# Patient Record
Sex: Female | Born: 1990
Health system: Southern US, Community
[De-identification: ages and names within clinical notes are randomized; demographics above are authoritative.]

## PROBLEM LIST (undated history)

## (undated) DIAGNOSIS — D649 Anemia, unspecified: Secondary | ICD-10-CM

## (undated) DIAGNOSIS — E559 Vitamin D deficiency, unspecified: Secondary | ICD-10-CM

## (undated) DIAGNOSIS — J302 Other seasonal allergic rhinitis: Secondary | ICD-10-CM

## (undated) DIAGNOSIS — R87629 Unspecified abnormal cytological findings in specimens from vagina: Secondary | ICD-10-CM

## (undated) DIAGNOSIS — M549 Dorsalgia, unspecified: Secondary | ICD-10-CM

## (undated) DIAGNOSIS — T7840XA Allergy, unspecified, initial encounter: Secondary | ICD-10-CM

## (undated) DIAGNOSIS — F419 Anxiety disorder, unspecified: Secondary | ICD-10-CM

## (undated) DIAGNOSIS — G43909 Migraine, unspecified, not intractable, without status migrainosus: Secondary | ICD-10-CM

## (undated) DIAGNOSIS — M2550 Pain in unspecified joint: Secondary | ICD-10-CM

## (undated) HISTORY — DX: Unspecified abnormal cytological findings in specimens from vagina: R87.629

## (undated) HISTORY — DX: Dorsalgia, unspecified: M54.9

## (undated) HISTORY — DX: Anxiety disorder, unspecified: F41.9

## (undated) HISTORY — DX: Anemia, unspecified: D64.9

## (undated) HISTORY — PX: APPENDECTOMY: SHX54

## (undated) HISTORY — PX: LEEP: SHX91

## (undated) HISTORY — DX: Pain in unspecified joint: M25.50

## (undated) HISTORY — DX: Allergy, unspecified, initial encounter: T78.40XA

## (undated) HISTORY — DX: Vitamin D deficiency, unspecified: E55.9

---
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
## 2009-03-10 ENCOUNTER — Emergency Department (HOSPITAL_COMMUNITY): Admission: EM | Admit: 2009-03-10 | Discharge: 2009-03-10 | Payer: Self-pay | Admitting: Family Medicine

## 2009-05-25 ENCOUNTER — Emergency Department (HOSPITAL_COMMUNITY): Admission: EM | Admit: 2009-05-25 | Discharge: 2009-05-25 | Payer: Self-pay | Admitting: Family Medicine

## 2009-05-26 ENCOUNTER — Observation Stay (HOSPITAL_COMMUNITY): Admission: EM | Admit: 2009-05-26 | Discharge: 2009-05-26 | Payer: Self-pay | Admitting: Emergency Medicine

## 2010-04-13 LAB — DIFFERENTIAL
Basophils Absolute: 0.1 10*3/uL (ref 0.0–0.1)
Basophils Relative: 0 % (ref 0–1)
Eosinophils Absolute: 0.1 10*3/uL (ref 0.0–0.7)
Lymphs Abs: 2.2 10*3/uL (ref 0.7–4.0)
Monocytes Absolute: 0.6 10*3/uL (ref 0.1–1.0)
Neutro Abs: 9.1 10*3/uL — ABNORMAL HIGH (ref 1.7–7.7)

## 2010-04-13 LAB — POCT URINALYSIS DIP (DEVICE)
Glucose, UA: NEGATIVE mg/dL
Hgb urine dipstick: NEGATIVE
Ketones, ur: 40 mg/dL — AB
Specific Gravity, Urine: 1.02 (ref 1.005–1.030)
Urobilinogen, UA: 1 mg/dL (ref 0.0–1.0)

## 2010-04-13 LAB — BASIC METABOLIC PANEL
CO2: 26 mEq/L (ref 19–32)
Creatinine, Ser: 0.68 mg/dL (ref 0.4–1.2)
GFR calc Af Amer: >60 mL/min (ref >60)
GFR calc non Af Amer: >60 mL/min (ref >60)
Potassium: 3.5 mEq/L (ref 3.5–5.1)

## 2010-04-13 LAB — CBC
HCT: 34.9 % — ABNORMAL LOW (ref 36.0–46.0)
Hemoglobin: 12 g/dL (ref 12.0–15.0)
MCHC: 34.3 g/dL (ref 30.0–36.0)
MCV: 84.4 fL (ref 78.0–100.0)
Platelets: 231 10*3/uL (ref 150–400)
RBC: 4.14 MIL/uL (ref 3.87–5.11)

## 2010-04-13 LAB — POCT PREGNANCY, URINE: Preg Test, Ur: NEGATIVE

## 2010-05-10 LAB — POCT URINALYSIS DIP (DEVICE)
Glucose, UA: NEGATIVE mg/dL
Hgb urine dipstick: NEGATIVE
Ketones, ur: NEGATIVE mg/dL
Specific Gravity, Urine: 1.025 (ref 1.005–1.030)
Urobilinogen, UA: 0.2 mg/dL (ref 0.0–1.0)
pH: 7 (ref 5.0–8.0)

## 2010-05-10 LAB — POCT PREGNANCY, URINE: Preg Test, Ur: NEGATIVE

## 2010-05-11 LAB — POCT RAPID STREP A (OFFICE): Streptococcus, Group A Screen (Direct): NEGATIVE

## 2010-09-28 ENCOUNTER — Inpatient Hospital Stay (INDEPENDENT_AMBULATORY_CARE_PROVIDER_SITE_OTHER)
Admission: RE | Admit: 2010-09-28 | Discharge: 2010-09-28 | Disposition: A | Discharge status: Home / Self Care | Source: Ambulatory Visit | Attending: Family Medicine | Admitting: Family Medicine

## 2010-09-28 DIAGNOSIS — J309 Allergic rhinitis, unspecified: Secondary | ICD-10-CM

## 2010-11-10 ENCOUNTER — Inpatient Hospital Stay (INDEPENDENT_AMBULATORY_CARE_PROVIDER_SITE_OTHER)
Admission: RE | Admit: 2010-11-10 | Discharge: 2010-11-10 | Disposition: A | Discharge status: Home / Self Care | Source: Ambulatory Visit | Attending: Family Medicine | Admitting: Family Medicine

## 2010-11-10 DIAGNOSIS — M722 Plantar fascial fibromatosis: Secondary | ICD-10-CM

## 2011-06-19 ENCOUNTER — Encounter (HOSPITAL_COMMUNITY): Payer: Self-pay

## 2011-06-19 ENCOUNTER — Emergency Department (HOSPITAL_COMMUNITY)
Admission: EM | Admit: 2011-06-19 | Discharge: 2011-06-19 | Disposition: A | Discharge status: Home / Self Care | Source: Home / Self Care | Attending: Emergency Medicine | Admitting: Emergency Medicine

## 2011-06-19 DIAGNOSIS — G44209 Tension-type headache, unspecified, not intractable: Secondary | ICD-10-CM

## 2011-06-19 DIAGNOSIS — F4541 Pain disorder exclusively related to psychological factors: Secondary | ICD-10-CM

## 2011-06-19 MED ORDER — ACETAMINOPHEN-CODEINE #3 300-30 MG PO TABS: 1.0000 | ORAL_TABLET | Freq: Four times a day (QID) | ORAL | Status: AC | PRN

## 2011-06-19 NOTE — ED Notes (Signed)
Pt has headache that started on Wednesday, denies cough, fever and congestion.  Pain is above both eyes.

## 2011-06-19 NOTE — ED Provider Notes (Signed)
History     CSN: [id number]  Arrival date & time 06/19/11  1542   First MD Initiated Contact with Patient 06/19/11 1558      Chief Complaint  Patient presents with  . Headache    (Consider location/radiation/quality/duration/timing/severity/associated sxs/prior treatment) HPI Comments: Patient has been expressing a headache since Wednesday. Patient denies any fevers, upper congestion visual changes, numbness, tingling, sudden weakness of any upper or lower extremity. She attributed this headache and she's been reading a lot of studying a lot and under a lot of stress. Patient does describe that light bothers her somewhat. Denies any head injuries and denies any nausea.  Patient is a 21 y.o. female presenting with headaches.  Headache The primary symptoms include headaches. Primary symptoms do not include syncope, loss of consciousness, altered mental status, seizures, dizziness, visual change, focal weakness, loss of sensation, memory loss, fever, nausea or vomiting. The symptoms began 2 days ago. The symptoms are unchanged.  The headache is not associated with photophobia, visual change or neck stiffness.  Additional symptoms include pain. Additional symptoms do not include neck stiffness, lower back pain, photophobia, hallucinations, nystagmus, tinnitus, vertigo or anxiety.    History reviewed. No pertinent past medical history.  History reviewed. No pertinent past surgical history.  History reviewed. No pertinent family history.  History  Substance Use Topics  . Smoking status: Never Smoker   . Smokeless tobacco: Not on file  . Alcohol Use: Yes    OB History    Grav Para Term Preterm Abortions TAB SAB Ect Mult Living                  Review of Systems  Constitutional: Negative for fever, chills, diaphoresis, activity change and appetite change.  HENT: Negative for neck stiffness and tinnitus.   Eyes: Negative for photophobia, discharge, itching and visual disturbance.   Respiratory: Negative for cough and shortness of breath.   Cardiovascular: Negative for leg swelling and syncope.  Gastrointestinal: Negative for nausea and vomiting.  Musculoskeletal: Negative for arthralgias.  Skin: Negative for rash.  Neurological: Positive for headaches. Negative for dizziness, vertigo, focal weakness, seizures and loss of consciousness.  Psychiatric/Behavioral: Negative for hallucinations, memory loss and altered mental status.    Allergies  Penicillins  Home Medications   Current Outpatient Rx  Name Route Sig Dispense Refill  . NAPROXEN SODIUM 220 MG PO TABS Oral Take 220 mg by mouth 2 (two) times daily with a meal.    . LOESTRIN 24 FE PO Oral Take by mouth.    . ACETAMINOPHEN-CODEINE #3 300-30 MG PO TABS Oral Take 1-2 tablets by mouth every 6 (six) hours as needed for pain. 15 tablet 0    BP 119/87  Pulse 72  Temp(Src) 98.8 F (37.1 C) (Oral)  Resp 18  SpO2 100%  LMP 05/28/2011  Physical Exam  Nursing note and vitals reviewed. Constitutional: She is oriented to person, place, and time. She appears well-developed and well-nourished. No distress.  HENT:  Mouth/Throat: No oropharyngeal exudate.  Eyes: Conjunctivae are normal.  Neck: Neck supple.  Cardiovascular: Normal rate.   Pulmonary/Chest: Effort normal.  Abdominal: Soft.  Neurological: She is alert and oriented to person, place, and time. No cranial nerve deficit or sensory deficit. She exhibits normal muscle tone. Coordination normal.  Skin: Skin is warm. No rash noted. No erythema.    ED Course  Procedures (including critical care time)  Labs Reviewed - No data to display No results found.   1. Stress  headaches       MDM  Tensional headaches patient has been stressed taking certain tests. Patient has no neurological symptoms and a normal exam.        Jimmie Molly, MD 06/19/11 1714

## 2011-06-19 NOTE — Discharge Instructions (Signed)
Tension Headache (Muscle Contraction Headache) Tension headache is one of the most common causes of head pain. These headaches are usually felt as a pain over the top of your head and back of your neck. Stress, anxiety, and depression are common triggers for these headaches. Tension headaches are not life-threatening and will not lead to other types of headaches. Tension headaches can often be diagnosed by taking a history from the patient and a physical exam. Sometimes, further lab and x-ray studies are used to confirm the diagnosis. Your caregiver can advise you on how to get help solving problems that cause anxiety or stress. Antidepressants can be prescribed if depression is a problem. HOME CARE INSTRUCTIONS   If testing was done, call for your results. Remember, it is your responsibility to get the results of all testing. Do not assume everything is fine because you do not hear from your caregiver.   Only take over-the-counter or prescription medicines for pain, discomfort, or fever as directed by your caregiver.   Biofeedback, massage, or other relaxation techniques may be helpful.   Ice packs or heat to the head and neck can be used. Use these three to four times per day or as needed.   Physical therapy may be a useful addition to treatment.   If headaches continue, even with therapy, you may need to think about lifestyle changes.   Avoid excessive use of pain killers, as rebound headaches can occur.  SEEK MEDICAL CARE IF:   You develop problems with medications prescribed.   You do not respond or get no relief from medications.   You have a change from the usual headache.   You develop nausea (feeling sick to your stomach) or vomiting.  SEEK IMMEDIATE MEDICAL CARE IF:   Your headache becomes severe.   You have an unexplained oral temperature above 102 F (38.9 C).   You develop a stiff neck.   You have loss of vision.   You have muscular weakness.   You have loss of  muscular control.   You develop severe symptoms different from your first symptoms.   You start losing your balance or have trouble walking.   You feel faint or pass out.  MAKE SURE YOU:   Understand these instructions.   Will watch your condition.   Will get help right away if you are not doing well or get worse.  Document Released: 01/10/2005 Document Revised: 12/30/2010 Document Reviewed: 08/30/2007 [employer] Patient Information 2012 [employer]. 

## 2011-12-19 ENCOUNTER — Other Ambulatory Visit: Payer: Self-pay | Admitting: Gynecology

## 2012-03-22 ENCOUNTER — Emergency Department (HOSPITAL_COMMUNITY)
Admission: EM | Admit: 2012-03-22 | Discharge: 2012-03-22 | Disposition: A | Discharge status: Home / Self Care | Attending: Emergency Medicine | Admitting: Emergency Medicine

## 2012-03-22 ENCOUNTER — Encounter (HOSPITAL_COMMUNITY): Payer: Self-pay | Admitting: *Deleted

## 2012-03-22 DIAGNOSIS — R1013 Epigastric pain: Secondary | ICD-10-CM | POA: Insufficient documentation

## 2012-03-22 DIAGNOSIS — R197 Diarrhea, unspecified: Secondary | ICD-10-CM | POA: Insufficient documentation

## 2012-03-22 DIAGNOSIS — Z3202 Encounter for pregnancy test, result negative: Secondary | ICD-10-CM | POA: Insufficient documentation

## 2012-03-22 DIAGNOSIS — R112 Nausea with vomiting, unspecified: Secondary | ICD-10-CM | POA: Insufficient documentation

## 2012-03-22 DIAGNOSIS — Z79899 Other long term (current) drug therapy: Secondary | ICD-10-CM | POA: Insufficient documentation

## 2012-03-22 LAB — URINALYSIS, ROUTINE W REFLEX MICROSCOPIC
Glucose, UA: NEGATIVE mg/dL
Ketones, ur: 15 mg/dL — AB
Leukocytes, UA: NEGATIVE
Nitrite: NEGATIVE
Urobilinogen, UA: 1 mg/dL (ref 0.0–1.0)

## 2012-03-22 MED ORDER — FAMOTIDINE 20 MG PO TABS
40.0000 mg | ORAL_TABLET | Freq: Once | ORAL | Status: AC
  Administered 2012-03-22: 40 mg via ORAL
  Filled 2012-03-22: qty 2

## 2012-03-22 MED ORDER — ONDANSETRON 8 MG PO TBDP: 8.0000 mg | ORAL_TABLET | Freq: Three times a day (TID) | ORAL | Status: DC | PRN

## 2012-03-22 MED ORDER — ONDANSETRON 4 MG PO TBDP
8.0000 mg | ORAL_TABLET | Freq: Once | ORAL | Status: AC
  Administered 2012-03-22: 8 mg via ORAL
  Filled 2012-03-22: qty 2

## 2012-03-22 MED ORDER — FAMOTIDINE 20 MG PO TABS: 20.0000 mg | ORAL_TABLET | Freq: Two times a day (BID) | ORAL | Status: DC

## 2012-03-22 NOTE — ED Notes (Signed)
C/o abd pain, n/v/d since midnight. Reports it was something she ate last night

## 2012-03-22 NOTE — ED Provider Notes (Signed)
Medical screening examination/treatment/procedure(s) were performed by non-physician practitioner and as supervising physician I was immediately available for consultation/collaboration.    Manisha Cancel D Dreya Buhrman, MD 03/22/12 1229 

## 2012-03-22 NOTE — ED Notes (Signed)
States had 1 episode diarrhea, denies bloody stool. Denies urinARY frequency, dysuria.

## 2012-03-22 NOTE — ED Provider Notes (Signed)
History     CSN: [id number]  Arrival date & time 03/22/12  0825   First MD Initiated Contact with Patient 03/22/12 [phone]      Chief Complaint  Patient presents with  . Abdominal Pain    (Consider location/radiation/quality/duration/timing/severity/associated sxs/prior treatment) HPI Comments: Patient with history of appendectomy -- presents with onset of watery, nonbloody diarrhea as well as nonbloody, nonbilious vomiting this morning. Patient states that the symptoms started with vague abdominal pain approximately midnight. Approximately 4 AM she began having diarrhea and vomiting. Abdominal pain is mild, epigastric. No treatments prior to arrival. Patient denies fever, chills, cold symptoms, chest pain, shortness of breath, dysuria, hematuria. Onset of symptoms acute. Course is constant. Nothing makes symptoms better or worse  The history is provided by the patient.    History reviewed. No pertinent past medical history.  History reviewed. No pertinent past surgical history.  No family history on file.  History  Substance Use Topics  . Smoking status: Never Smoker   . Smokeless tobacco: Not on file  . Alcohol Use: Yes    OB History   Grav Para Term Preterm Abortions TAB SAB Ect Mult Living                  Review of Systems  Constitutional: Negative for fever.  HENT: Negative for sore throat and rhinorrhea.   Eyes: Negative for redness.  Respiratory: Negative for cough.   Cardiovascular: Negative for chest pain.  Gastrointestinal: Positive for nausea, vomiting, abdominal pain and diarrhea.  Genitourinary: Negative for dysuria.  Musculoskeletal: Negative for myalgias.  Skin: Negative for rash.  Neurological: Negative for headaches.    Allergies  Penicillins  Home Medications   Current Outpatient Rx  Name  Route  Sig  Dispense  Refill  . baclofen (LIORESAL) 10 MG tablet   Oral   Take 10 mg by mouth 2 (two) times daily as needed (for migraines: patient may  only use 2 days out of 1 week.).         Marland Kitchen Norethin Ace-Eth Estrad-FE (MINASTRIN 24 FE PO)   Oral   Take 1 tablet by mouth every evening.         . topiramate (TOPAMAX) 25 MG tablet   Oral   Take 75 mg by mouth every evening.           BP 115/59  Pulse 95  Temp(Src) 99 F (37.2 C)  Resp 16  SpO2 100%  Physical Exam  Nursing note and vitals reviewed. Constitutional: She appears well-developed and well-nourished.  HENT:  Head: Normocephalic and atraumatic.  Eyes: Conjunctivae are normal. Right eye exhibits no discharge. Left eye exhibits no discharge.  Neck: Normal range of motion. Neck supple.  Cardiovascular: Normal rate, regular rhythm and normal heart sounds.   Pulmonary/Chest: Effort normal and breath sounds normal.  Abdominal: Soft. There is no tenderness.  Neurological: She is alert.  Skin: Skin is warm and dry.  Psychiatric: She has a normal mood and affect.    ED Course  Procedures (including critical care time)  Labs Reviewed  URINALYSIS, ROUTINE W REFLEX MICROSCOPIC - Abnormal; Notable for the following:    Ketones, ur 15 (*)    All other components within normal limits  POCT PREGNANCY, URINE   No results found.   1. Nausea vomiting and diarrhea     8:56 AM Patient seen and examined. Medications ordered.   Vital signs reviewed and are as follows: Filed Vitals:   03/22/12 1191  BP: 115/59  Pulse: 95  Temp: 99 F (37.2 C)  Resp: 16   11:41 AM Patient improved. She is drinking, eating crackers without vomiting. Abd exam: soft, non-tender.   Counseled on clear liquid diet, brat diet.   UA reviewed. Possible mild dehydration -- pt tolerating fluids. Not clinically dehydrated.   The patient was urged to return to the Emergency Department immediately with worsening of current symptoms, worsening abdominal pain, persistent vomiting, blood noted in stools, fever, or any other concerns. The patient verbalized understanding.    MDM  Patient  with symptoms consistent with viral gastroenteritis.  Vitals are stable, no fever.  No signs of dehydration, tolerating PO's.  Lungs are clear.  No focal abdominal pain, no concern for appendicitis, cholecystitis, pancreatitis, ruptured viscus, UTI, kidney stone, or any other abdominal etiology.  Supportive therapy indicated with return if symptoms worsen.  Patient counseled.         Renne Crigler, [place] 03/22/12 1144

## 2012-09-18 ENCOUNTER — Other Ambulatory Visit: Payer: Self-pay | Admitting: Gynecology

## 2012-10-16 ENCOUNTER — Emergency Department (HOSPITAL_COMMUNITY)
Admission: EM | Admit: 2012-10-16 | Discharge: 2012-10-16 | Disposition: A | Discharge status: Home / Self Care | Payer: BC Managed Care – PPO | Source: Home / Self Care | Attending: Family Medicine | Admitting: Family Medicine

## 2012-10-16 ENCOUNTER — Encounter (HOSPITAL_COMMUNITY): Payer: Self-pay | Admitting: Emergency Medicine

## 2012-10-16 DIAGNOSIS — G43909 Migraine, unspecified, not intractable, without status migrainosus: Secondary | ICD-10-CM

## 2012-10-16 HISTORY — DX: Migraine, unspecified, not intractable, without status migrainosus: G43.909

## 2012-10-16 MED ORDER — DEXAMETHASONE SODIUM PHOSPHATE 10 MG/ML IJ SOLN
10.0000 mg | Freq: Once | INTRAMUSCULAR | Status: AC
  Administered 2012-10-16: 10 mg via INTRAMUSCULAR

## 2012-10-16 MED ORDER — METOCLOPRAMIDE HCL 5 MG/ML IJ SOLN
5.0000 mg | Freq: Once | INTRAMUSCULAR | Status: AC
  Administered 2012-10-16: 5 mg via INTRAMUSCULAR

## 2012-10-16 MED ORDER — KETOROLAC TROMETHAMINE 60 MG/2ML IM SOLN
INTRAMUSCULAR | Status: AC
  Filled 2012-10-16: qty 2

## 2012-10-16 MED ORDER — KETOROLAC TROMETHAMINE 60 MG/2ML IM SOLN
60.0000 mg | Freq: Once | INTRAMUSCULAR | Status: AC
  Administered 2012-10-16: 60 mg via INTRAMUSCULAR

## 2012-10-16 MED ORDER — SUMATRIPTAN SUCCINATE 6 MG/0.5ML ~~LOC~~ SOLN
SUBCUTANEOUS | Status: AC
  Filled 2012-10-16: qty 0.5

## 2012-10-16 MED ORDER — SUMATRIPTAN SUCCINATE 6 MG/0.5ML ~~LOC~~ SOLN
6.0000 mg | Freq: Once | SUBCUTANEOUS | Status: AC
  Administered 2012-10-16: 6 mg via SUBCUTANEOUS

## 2012-10-16 MED ORDER — DEXAMETHASONE SODIUM PHOSPHATE 10 MG/ML IJ SOLN
INTRAMUSCULAR | Status: AC
  Filled 2012-10-16: qty 1

## 2012-10-16 MED ORDER — METOCLOPRAMIDE HCL 5 MG/ML IJ SOLN
INTRAMUSCULAR | Status: AC
  Filled 2012-10-16: qty 2

## 2012-10-16 NOTE — ED Notes (Signed)
Pt c/o migraine headache onset Sunday Sxs include: n/v... sxs increase w/bright light and loud noise Taking Topirimate and Baclofen w/no relief Also took a hydrocodone on Sunday w/no relief Alert w/no signs of acute distress.

## 2012-10-16 NOTE — ED Provider Notes (Signed)
Peggy Kelly is a 22 y.o. female who presents to Urgent Care today for migraine headache. Patient has a history of migraine headache currently being managed by a headache clinic. She notes onset of pounding left-sided pain associated with photophobia and nausea starting Sunday. She denies any blurry vision weakness numbness difficulty walking or difficulty with coordination swallowing or speaking. She's tried hydrocodone, and her baclofen Topamax which have not worked for her much. She feels well otherwise. His current headache is consistent with prior migraines but worse and longer.   Past Medical History  Diagnosis Date  . Migraines    History  Substance Use Topics  . Smoking status: Never Smoker   . Smokeless tobacco: Not on file  . Alcohol Use: Yes   ROS as above Medications reviewed. Current Facility-Administered Medications  Medication Dose Route Frequency Provider Last Rate Last Dose  . dexamethasone (DECADRON) injection 10 mg  10 mg Intramuscular Once Rodolph Bong, MD      . ketorolac (TORADOL) injection 60 mg  60 mg Intramuscular Once Rodolph Bong, MD      . metoCLOPramide (REGLAN) injection 5 mg  5 mg Intramuscular Once Rodolph Bong, MD      . SUMAtriptan (IMITREX) injection 6 mg  6 mg Subcutaneous Once Rodolph Bong, MD       Current Outpatient Prescriptions  Medication Sig Dispense Refill  . baclofen (LIORESAL) 10 MG tablet Take 10 mg by mouth 2 (two) times daily as needed (for migraines: patient may only use 2 days out of 1 week.).      Marland Kitchen topiramate (TOPAMAX) 25 MG tablet Take 75 mg by mouth every evening.      . famotidine (PEPCID) 20 MG tablet Take 1 tablet (20 mg total) by mouth 2 (two) times daily.  15 tablet  0  . Norethin Ace-Eth Estrad-FE (MINASTRIN 24 FE PO) Take 1 tablet by mouth every evening.      . ondansetron (ZOFRAN ODT) 8 MG disintegrating tablet Take 1 tablet (8 mg total) by mouth every 8 (eight) hours as needed for nausea.  6 tablet  0    Exam:  BP  122/84  Pulse 75  Temp(Src) 98.5 F (36.9 C) (Oral)  Resp 18  SpO2 99%  LMP 10/16/2012 Gen: Well NAD HEENT: EOMI,  MMM PERRLA. No meningismus Lungs: CTABL Nl WOB Heart: RRR no MRG Exts: Non edematous BL  LE, warm and well perfused.  Neuro: Alert and oriented cranial nerves II through XII are intact reflexes are normal and equal bilateral upper and lower extremities. Normal gait and balance. Normal coordination and sensation and strength  No results found for this or any previous visit (from the past 24 hour(s)). No results found.  Assessment and Plan: 22 y.o. female with migraine headache.  Plan this headache at.consisting of IM dexamethasone, ketorolac, Reglan. We'll use subcutaneous Imitrex. Followup with migraine physician as needed.  Discussed warning signs or symptoms. Please see discharge instructions. Patient expresses understanding.      Rodolph Bong, MD 10/16/12 [phone]

## 2013-02-13 ENCOUNTER — Other Ambulatory Visit: Payer: Self-pay | Admitting: Gynecology

## 2013-02-26 ENCOUNTER — Other Ambulatory Visit: Payer: Self-pay | Admitting: Gynecology

## 2013-04-30 ENCOUNTER — Encounter (HOSPITAL_COMMUNITY): Payer: Self-pay | Admitting: Pharmacist

## 2013-05-06 NOTE — H&P (Addendum)
A 23 year old presents today for surgical mngt.  She continues to have heavy menses despite Minastrin.  Ultrasound showed endometrial mass suspicious for a polyp located in the anterior endometrium at the internal cervical os.  She has also been followed for persistent high-grade cervical dysplasia.  Cervical biopsies have been discordant.  The patient is scheduled for a follow-up colposcopy in two months and would like to combine the colposcopy with the hysteroscopy, D&C.   PMHx:  Neg PSHx:  Neg SHx:  Negative tobacco All:  PCN Meds:  OCPs FHx:  Negative  AF, VSS Gen - NAD Abd - soft, NT/ND Ext - NT  Pap - HGSIL, colpo bx c/w koilocytic atypia, no evidence of CIN II/III on biopsy  A/P:  1. Endometrial polyp, menorrhagia  2.  High grade cervical dysplasia with discordant biopsies Plan for hysteroscopy, D&C, colposcopy  R/b/a discussed, questions answered, informed consent

## 2013-05-08 ENCOUNTER — Encounter (HOSPITAL_COMMUNITY): Payer: Self-pay | Admitting: Anesthesiology

## 2013-05-08 NOTE — Anesthesia Preprocedure Evaluation (Addendum)
Anesthesia Evaluation  Patient identified by MRN, date of birth, ID band Patient awake    Reviewed: Allergy & Precautions, H&P , NPO status , Patient's Chart, lab work & pertinent test results  Airway Mallampati: II TM Distance: >3 FB Neck ROM: Full    Dental no notable dental hx. (+) Teeth Intact   Pulmonary neg pulmonary ROS,  breath sounds clear to auscultation  Pulmonary exam normal       Cardiovascular negative cardio ROS  Rhythm:Regular Rate:Normal     Neuro/Psych  Headaches, negative psych ROS   GI/Hepatic negative GI ROS, Neg liver ROS,   Endo/Other  negative endocrine ROS  Renal/GU negative Renal ROS  negative genitourinary   Musculoskeletal negative musculoskeletal ROS (+)   Abdominal   Peds  Hematology negative hematology ROS (+)   Anesthesia Other Findings   Reproductive/Obstetrics Abnormal pap smear Endometrial polyp                          Anesthesia Physical Anesthesia Plan  ASA: II  Anesthesia Plan: General   Post-op Pain Management:    Induction: Intravenous  Airway Management Planned: LMA  Additional Equipment:   Intra-op Plan:   Post-operative Plan: Extubation in OR  Informed Consent: I have reviewed the patients History and Physical, chart, labs and discussed the procedure including the risks, benefits and alternatives for the proposed anesthesia with the patient or authorized representative who has indicated his/her understanding and acceptance.   Dental advisory given  Plan Discussed with: CRNA, Surgeon and Anesthesiologist  Anesthesia Plan Comments:         Anesthesia Quick Evaluation

## 2013-05-09 ENCOUNTER — Ambulatory Visit (HOSPITAL_COMMUNITY): Payer: BC Managed Care – PPO | Admitting: Anesthesiology

## 2013-05-09 ENCOUNTER — Encounter (HOSPITAL_COMMUNITY): Payer: BC Managed Care – PPO | Admitting: Anesthesiology

## 2013-05-09 ENCOUNTER — Encounter (HOSPITAL_COMMUNITY): Payer: Self-pay | Admitting: Anesthesiology

## 2013-05-09 ENCOUNTER — Encounter (HOSPITAL_COMMUNITY)
Admission: RE | Disposition: A | Discharge status: Home / Self Care | Payer: Self-pay | Source: Ambulatory Visit | Attending: Obstetrics and Gynecology

## 2013-05-09 ENCOUNTER — Ambulatory Visit (HOSPITAL_COMMUNITY)
Admission: RE | Admit: 2013-05-09 | Discharge: 2013-05-09 | Disposition: A | Discharge status: Home / Self Care | Payer: BC Managed Care – PPO | Source: Ambulatory Visit | Attending: Obstetrics and Gynecology | Admitting: Obstetrics and Gynecology

## 2013-05-09 DIAGNOSIS — D069 Carcinoma in situ of cervix, unspecified: Secondary | ICD-10-CM | POA: Insufficient documentation

## 2013-05-09 DIAGNOSIS — N841 Polyp of cervix uteri: Secondary | ICD-10-CM | POA: Insufficient documentation

## 2013-05-09 DIAGNOSIS — N840 Polyp of corpus uteri: Secondary | ICD-10-CM | POA: Insufficient documentation

## 2013-05-09 DIAGNOSIS — N920 Excessive and frequent menstruation with regular cycle: Secondary | ICD-10-CM | POA: Insufficient documentation

## 2013-05-09 HISTORY — PX: DILATATION & CURETTAGE/HYSTEROSCOPY WITH TRUECLEAR: SHX6353

## 2013-05-09 HISTORY — PX: COLPOSCOPY: SHX161

## 2013-05-09 LAB — CBC
HCT: 36.6 % (ref 36.0–46.0)
Hemoglobin: 12 g/dL (ref 12.0–15.0)
MCH: 27 pg (ref 26.0–34.0)
MCHC: 32.8 g/dL (ref 30.0–36.0)
MCV: 82.2 fL (ref 78.0–100.0)
Platelets: 290 10*3/uL (ref 150–400)
RBC: 4.45 MIL/uL (ref 3.87–5.11)
RDW: 15.2 % (ref 11.5–15.5)
WBC: 6.4 10*3/uL (ref 4.0–10.5)

## 2013-05-09 LAB — PREGNANCY, URINE: PREG TEST UR: NEGATIVE

## 2013-05-09 SURGERY — DILATATION & CURETTAGE/HYSTEROSCOPY WITH TRUCLEAR
Anesthesia: General | Site: Vagina

## 2013-05-09 MED ORDER — MIDAZOLAM HCL 2 MG/2ML IJ SOLN
INTRAMUSCULAR | Status: AC
  Filled 2013-05-09: qty 2

## 2013-05-09 MED ORDER — LIDOCAINE HCL (CARDIAC) 20 MG/ML IV SOLN
INTRAVENOUS | Status: DC | PRN
  Administered 2013-05-09: 70 mg via INTRAVENOUS
  Administered 2013-05-09: 30 mg via INTRAVENOUS

## 2013-05-09 MED ORDER — FENTANYL CITRATE 0.05 MG/ML IJ SOLN
INTRAMUSCULAR | Status: AC
  Filled 2013-05-09: qty 5

## 2013-05-09 MED ORDER — FENTANYL CITRATE 0.05 MG/ML IJ SOLN
INTRAMUSCULAR | Status: AC
  Administered 2013-05-09: 50 ug via INTRAVENOUS
  Filled 2013-05-09: qty 2

## 2013-05-09 MED ORDER — HYDROCODONE-IBUPROFEN 7.5-200 MG PO TABS: 1.0000 | ORAL_TABLET | Freq: Three times a day (TID) | ORAL | Status: DC | PRN

## 2013-05-09 MED ORDER — LIDOCAINE HCL (CARDIAC) 20 MG/ML IV SOLN
INTRAVENOUS | Status: AC
  Filled 2013-05-09: qty 5

## 2013-05-09 MED ORDER — ACETIC ACID 5 % SOLN
Status: AC
  Filled 2013-05-09: qty 500

## 2013-05-09 MED ORDER — ONDANSETRON HCL 4 MG/2ML IJ SOLN
INTRAMUSCULAR | Status: DC | PRN
  Administered 2013-05-09: 4 mg via INTRAVENOUS

## 2013-05-09 MED ORDER — HYDROCODONE-ACETAMINOPHEN 5-325 MG PO TABS
1.0000 | ORAL_TABLET | Freq: Once | ORAL | Status: AC | PRN
  Administered 2013-05-09: 1 via ORAL

## 2013-05-09 MED ORDER — KETOROLAC TROMETHAMINE 30 MG/ML IJ SOLN
INTRAMUSCULAR | Status: DC | PRN
  Administered 2013-05-09: 30 mg via INTRAVENOUS

## 2013-05-09 MED ORDER — MEPERIDINE HCL 25 MG/ML IJ SOLN: 6.2500 mg | INTRAMUSCULAR | Status: DC | PRN

## 2013-05-09 MED ORDER — ONDANSETRON HCL 4 MG/2ML IJ SOLN
INTRAMUSCULAR | Status: AC
  Filled 2013-05-09: qty 2

## 2013-05-09 MED ORDER — FENTANYL CITRATE 0.05 MG/ML IJ SOLN
25.0000 ug | INTRAMUSCULAR | Status: DC | PRN
  Administered 2013-05-09: 50 ug via INTRAVENOUS
  Administered 2013-05-09: 25 ug via INTRAVENOUS

## 2013-05-09 MED ORDER — METOCLOPRAMIDE HCL 5 MG/ML IJ SOLN: 10.0000 mg | Freq: Once | INTRAMUSCULAR | Status: DC | PRN

## 2013-05-09 MED ORDER — LACTATED RINGERS IV SOLN
INTRAVENOUS | Status: DC
  Administered 2013-05-09: 07:00:00 via INTRAVENOUS

## 2013-05-09 MED ORDER — PROPOFOL 10 MG/ML IV BOLUS
INTRAVENOUS | Status: DC | PRN
  Administered 2013-05-09: 180 mg via INTRAVENOUS

## 2013-05-09 MED ORDER — FERRIC SUBSULFATE 259 MG/GM EX SOLN
CUTANEOUS | Status: AC
  Filled 2013-05-09: qty 8

## 2013-05-09 MED ORDER — MIDAZOLAM HCL 2 MG/2ML IJ SOLN
INTRAMUSCULAR | Status: DC | PRN
  Administered 2013-05-09: 2 mg via INTRAVENOUS

## 2013-05-09 MED ORDER — DEXAMETHASONE SODIUM PHOSPHATE 10 MG/ML IJ SOLN
INTRAMUSCULAR | Status: DC | PRN
  Administered 2013-05-09: 10 mg via INTRAVENOUS

## 2013-05-09 MED ORDER — OXYCODONE-ACETAMINOPHEN 5-325 MG PO TABS
ORAL_TABLET | ORAL | Status: AC
  Filled 2013-05-09: qty 1

## 2013-05-09 MED ORDER — IODINE STRONG (LUGOLS) 5 % PO SOLN
ORAL | Status: AC
  Filled 2013-05-09: qty 1

## 2013-05-09 MED ORDER — HYDROCODONE-ACETAMINOPHEN 5-325 MG PO TABS
ORAL_TABLET | ORAL | Status: AC
  Filled 2013-05-09: qty 1

## 2013-05-09 MED ORDER — SODIUM CHLORIDE 0.9 % IR SOLN
Status: DC | PRN
  Administered 2013-05-09: 1

## 2013-05-09 MED ORDER — LIDOCAINE HCL 1 % IJ SOLN
INTRAMUSCULAR | Status: AC
  Filled 2013-05-09: qty 20

## 2013-05-09 MED ORDER — DEXAMETHASONE SODIUM PHOSPHATE 10 MG/ML IJ SOLN
INTRAMUSCULAR | Status: AC
  Filled 2013-05-09: qty 1

## 2013-05-09 MED ORDER — PROPOFOL 10 MG/ML IV EMUL
INTRAVENOUS | Status: AC
  Filled 2013-05-09: qty 20

## 2013-05-09 MED ORDER — FENTANYL CITRATE 0.05 MG/ML IJ SOLN
INTRAMUSCULAR | Status: DC | PRN
  Administered 2013-05-09 (×2): 50 ug via INTRAVENOUS

## 2013-05-09 MED ORDER — ACETIC ACID 4% SOLUTION
Status: DC | PRN
  Administered 2013-05-09: 1 via TOPICAL

## 2013-05-09 MED ORDER — LIDOCAINE HCL 1 % IJ SOLN
INTRAMUSCULAR | Status: DC | PRN
Start: 2013-05-09 — End: 2013-05-09
  Administered 2013-05-09: 10 mL

## 2013-05-09 MED ORDER — KETOROLAC TROMETHAMINE 30 MG/ML IJ SOLN
INTRAMUSCULAR | Status: AC
  Filled 2013-05-09: qty 1

## 2013-05-09 SURGICAL SUPPLY — 26 items
APPLICATOR COTTON TIP 6IN STRL (MISCELLANEOUS) IMPLANT
BLADE INCISOR TRUC PLUS 2.9 (ABLATOR) IMPLANT
CANISTERS HI-FLOW 3000CC (CANNISTER) IMPLANT
CATH ROBINSON RED A/P 16FR (CATHETERS) ×3 IMPLANT
CLOTH BEACON ORANGE TIMEOUT ST (SAFETY) ×3 IMPLANT
CONTAINER PREFILL 10% NBF 15ML (MISCELLANEOUS) ×6 IMPLANT
CONTAINER PREFILL 10% NBF 60ML (FORM) ×6 IMPLANT
DRAPE HYSTEROSCOPY (DRAPE) ×3 IMPLANT
DRSG TELFA 3X8 NADH (GAUZE/BANDAGES/DRESSINGS) ×3 IMPLANT
GLOVE BIO SURGEON STRL SZ 6.5 (GLOVE) ×2 IMPLANT
GLOVE BIO SURGEONS STRL SZ 6.5 (GLOVE) ×1
GLOVE BIOGEL PI IND STRL 7.0 (GLOVE) ×1 IMPLANT
GLOVE BIOGEL PI INDICATOR 7.0 (GLOVE) ×2
GOWN STRL REUS W/TWL LRG LVL3 (GOWN DISPOSABLE) ×6 IMPLANT
INCISOR TRUC PLUS BLADE 2.9 (ABLATOR)
KIT HYSTEROSCOPY TRUCLEAR (ABLATOR) IMPLANT
MORCELLATOR RECIP TRUCLEAR 4.0 (ABLATOR) IMPLANT
NEEDLE SPNL 22GX3.5 QUINCKE BK (NEEDLE) ×3 IMPLANT
NS IRRIG 1000ML POUR BTL (IV SOLUTION) ×3 IMPLANT
PACK VAGINAL MINOR WOMEN LF (CUSTOM PROCEDURE TRAY) ×3 IMPLANT
PAD OB MATERNITY 4.3X12.25 (PERSONAL CARE ITEMS) ×3 IMPLANT
SCOPETTES 8  STERILE (MISCELLANEOUS)
SCOPETTES 8 STERILE (MISCELLANEOUS) IMPLANT
SYR CONTROL 10ML LL (SYRINGE) ×3 IMPLANT
TOWEL OR 17X24 6PK STRL BLUE (TOWEL DISPOSABLE) ×6 IMPLANT
WATER STERILE IRR 1000ML POUR (IV SOLUTION) ×3 IMPLANT

## 2013-05-09 NOTE — Discharge Instructions (Signed)
[facility] office 2-3 weeks for postop appointment.  Call the office [phone] for an appointment. ° °Personal Hygiene: °Use pads not tampons x 1week °You may shower, no tub baths or pools for 2-3 weeks °Wipe from front to back when using restroom ° °Activity: °Do not drive or operate any equipment for 24 hrs.   °Do not rest in bed all day °Walking is encouraged °Walk up and down stairs slowly °You may return to your normal activity in 1-2 days ° °Sexual Activity:  No intercourse for 2 weeks after the procedure. ° °Diet: Eat a light meal as desired this evening.  You may resume your usual diet tomorrow. ° °Return to work:  You may resume your work activities after 1-2 days ° °What to expect:  Expect to have vaginal bleeding/discharge for 2-3 days and spotting for 10-14 days.  It is not unusual to have soreness for 1-2 weeks.  You may have a slight burning sensation when you urinate for the first few days.  You may start your menses in 2-6 weeks.  Mild cramps may continue for a couple of days.   ° °Call your doctor:   °Excessive bleeding, saturating a pad every hour °Inability to urinate 6 hours after discharge °Pain not relieved with pain medications °Fever of 100.4 or greater ° °

## 2013-05-09 NOTE — Anesthesia Postprocedure Evaluation (Signed)
  Anesthesia Post-op Note  Anesthesia Post Note  Patient: Peggy Kelly  Procedure(s) Performed: Procedure(s) (LRB): DILATATION & CURETTAGE/HYSTEROSCOPY WITH TRUCLEAR (N/A) COLPOSCOPY WITH ECC (N/A)  Anesthesia type: General  Patient location: PACU  Post pain: Pain level controlled  Post assessment: Post-op Vital signs reviewed  Last Vitals:  Filed Vitals:   05/09/13 0900  BP: 119/68  Pulse: 85  Temp: 36.8 C  Resp: 16    Post vital signs: Reviewed  Level of consciousness: sedated  Complications: No apparent anesthesia complications

## 2013-05-09 NOTE — Transfer of Care (Signed)
Immediate Anesthesia Transfer of Care Note  Patient: Susanne GreenhouseShontia R Parker  Procedure(s) Performed: Procedure(s): DILATATION & CURETTAGE/HYSTEROSCOPY WITH TRUCLEAR (N/A) COLPOSCOPY WITH ECC (N/A)  Patient Location: PACU  Anesthesia Type:General  Level of Consciousness: awake, sedated and patient cooperative  Airway & Oxygen Therapy: Patient Spontanous Breathing and Patient connected to nasal cannula oxygen  Post-op Assessment: Report given to PACU RN and Post -op Vital signs reviewed and stable  Post vital signs: Reviewed and stable  Complications: No apparent anesthesia complications

## 2013-05-10 ENCOUNTER — Encounter (HOSPITAL_COMMUNITY): Payer: Self-pay | Admitting: Obstetrics and Gynecology

## 2013-05-10 NOTE — Op Note (Signed)
NAMJenene Kelly:  Kelly, Peggy              ACCOUNT NO.:  [id number]  MEDICAL RECORD NO.:  [id number]  LOCATION:  [facility]                          FACILITY:  [facility]  PHYSICIAN:  Zelphia CairoGretchen Lauri Till, MD    DATE OF BIRTH:  April 25, 1990  DATE OF PROCEDURE:  05/09/2013 DATE OF DISCHARGE:  05/09/2013                              OPERATIVE REPORT   PREOPERATIVE DIAGNOSES: 1. Abnormal Pap smear in January with discordant colposcopic-guided     biopsies. 2. Endometrial polyp and irregular vaginal bleeding.  POSTOPERATIVE DIAGNOSES: 1. Abnormal Pap smear. 2. Endocervical polyp.  PROCEDURE: 1. Colposcopy with biopsy. 2. Endocervical curettage. 3. Cervical block. 4. Hysteroscopy. 5. Resection of endocervical polyp.  SURGEON:  Zelphia CairoGretchen Schneider Warchol, MD  ANESTHESIA:  General.  SPECIMEN: 1. Cervical biopsy at 5 o'clock. 2. Cervical biopsy at 12 o'clock. 3. Endocervical curettage. 4. Endocervical polyp.  COMPLICATIONS:  None.  CONDITION:  Stable to recovery room.  PROCEDURE IN DETAIL:  The patient was taken to the operating room. After informed consent was obtained, she was given general anesthesia. Placed in the dorsal lithotomy position using Allen stirrups. Colposcopy was performed, which was adequate.  Faint acetowhite changes were noted at 5 o'clock and 12 o'clock.  No mosaic pattern or increased vascularity was noted.  No evidence of high-grade dysplasia seen on colposcopy today.  Green filter was also applied to confirm these findings.  Cervical biopsy x2 and endocervical curettage were performed. The patient was then sterilely prepped and draped.  In and out catheter was used to drain her bladder.  Bivalve speculum was placed in the vagina and a cervical block was performed.  Single-tooth tenaculum was placed on the anterior lip of the cervix.  The cervix was serially dilated, and the hysteroscope was inserted.  The endometrial cavity appeared atrophic.  Bilateral ostia were visualized and  appeared normal. No endometrial polyps were identified.  There was an endocervical polyp at the internal cervical os noted anterior.  Truclear device was inserted through the hysteroscope and polyp was resected without difficulty.  Endometrial curettage was not performed as the endometrial cavity was without abnormality.  All instruments were then removed from the vagina and cervix.  The cervix was hemostatic.  Speculum was removed.  The patient was extubated and taken to the recovery room in stable condition.  Sponge, lap, needle, and instrument counts were correct x2.     Zelphia CairoGretchen Ewel Lona, MD     GA/MEDQ  D:  05/09/2013  T:  05/09/2013  Job:  [id number]

## 2013-05-28 ENCOUNTER — Encounter (HOSPITAL_COMMUNITY): Payer: Self-pay | Admitting: Emergency Medicine

## 2013-05-28 ENCOUNTER — Emergency Department (HOSPITAL_COMMUNITY)
Admission: EM | Admit: 2013-05-28 | Discharge: 2013-05-28 | Disposition: A | Discharge status: Home / Self Care | Payer: BC Managed Care – PPO | Attending: Emergency Medicine | Admitting: Emergency Medicine

## 2013-05-28 DIAGNOSIS — K0889 Other specified disorders of teeth and supporting structures: Secondary | ICD-10-CM

## 2013-05-28 DIAGNOSIS — K089 Disorder of teeth and supporting structures, unspecified: Secondary | ICD-10-CM | POA: Insufficient documentation

## 2013-05-28 DIAGNOSIS — Z880 Allergy status to penicillin: Secondary | ICD-10-CM | POA: Insufficient documentation

## 2013-05-28 DIAGNOSIS — Z8679 Personal history of other diseases of the circulatory system: Secondary | ICD-10-CM | POA: Insufficient documentation

## 2013-05-28 DIAGNOSIS — Z79899 Other long term (current) drug therapy: Secondary | ICD-10-CM | POA: Insufficient documentation

## 2013-05-28 MED ORDER — HYDROCODONE-ACETAMINOPHEN 5-325 MG PO TABS
2.0000 | ORAL_TABLET | ORAL | Status: DC | PRN
Start: 2013-05-28 — End: 2014-02-25

## 2013-05-28 MED ORDER — CLINDAMYCIN HCL 150 MG PO CAPS: 300.0000 mg | ORAL_CAPSULE | Freq: Three times a day (TID) | ORAL | Status: DC

## 2013-05-28 MED ORDER — NAPROXEN 500 MG PO TABS: 500.0000 mg | ORAL_TABLET | Freq: Two times a day (BID) | ORAL | Status: DC

## 2013-05-28 MED ORDER — HYDROCODONE-ACETAMINOPHEN 5-325 MG PO TABS
2.0000 | ORAL_TABLET | Freq: Once | ORAL | Status: AC
  Administered 2013-05-28: 2 via ORAL
  Filled 2013-05-28: qty 2

## 2013-05-28 NOTE — Discharge Instructions (Signed)
Please call your doctor for a followup appointment within 24-48 hours. When you talk to your doctor please let them know that you were seen in the emergency department and have them acquire all of your records so that they can discuss the findings with you and formulate a treatment plan to fully care for your new and ongoing problems. ° °

## 2013-05-28 NOTE — ED Provider Notes (Signed)
CSN: [id number]     Arrival date & time 05/28/13  1500 History   First MD Initiated Contact with Patient 05/28/13 1519     Chief Complaint  Patient presents with  . Dental Pain     (Consider location/radiation/quality/duration/timing/severity/associated sxs/prior Treatment) HPI Comments: 23 year old female presents with right upper dental pain. She states that this waxes and wanes and she has had for some time. It hurts to eat certain foods though she is nonspecific and refuses to answer more questions. She denies any swelling of the jaw or difficulty swallowing and has had no fevers chills nausea or vomiting.  Patient is a 23 y.o. female presenting with tooth pain. The history is provided by the patient.  Dental Pain Location:  Upper Associated symptoms: no facial swelling and no fever     Past Medical History  Diagnosis Date  . Migraines    Past Surgical History  Procedure Laterality Date  . Appendectomy    . Dilatation & curettage/hysteroscopy with trueclear N/A 05/09/2013    Procedure: DILATATION & CURETTAGE/HYSTEROSCOPY WITH TRUCLEAR;  Surgeon: Zelphia CairoGretchen Adkins, MD;  Location: [facility];  Service: Gynecology;  Laterality: N/A;  . Colposcopy N/A 05/09/2013    Procedure: COLPOSCOPY WITH ECC;  Surgeon: Zelphia CairoGretchen Adkins, MD;  Location: [facility];  Service: Gynecology;  Laterality: N/A;   History reviewed. No pertinent family history. History  Substance Use Topics  . Smoking status: Never Smoker   . Smokeless tobacco: Not on file  . Alcohol Use: Yes   OB History   Grav Para Term Preterm Abortions TAB SAB Ect Mult Living                 Review of Systems  Constitutional: Negative for fever and chills.  HENT: Positive for dental problem. Negative for facial swelling, sore throat, trouble swallowing and voice change.        Toothache  Gastrointestinal: Negative for nausea and vomiting.      Allergies  Penicillins  Home Medications   Prior to Admission medications    Medication Sig Start Date End Date Taking? Authorizing Provider  HYDROcodone-acetaminophen (NORCO) 7.5-325 MG per tablet Take 1 tablet by mouth every 6 (six) hours as needed for moderate pain.   Yes Historical Provider, MD  Norethin Ace-Eth Estrad-FE (MINASTRIN 24 FE PO) Take 1 tablet by mouth every evening.    Historical Provider, MD   BP 122/93  Pulse 89  Temp(Src) 98.1 F (36.7 C) (Oral)  Resp 16  SpO2 100% Physical Exam  Nursing note and vitals reviewed. Constitutional: She appears well-developed and well-nourished. No distress.  HENT:  Head: Normocephalic and atraumatic.  Mouth/Throat: Oropharynx is clear and moist. No oropharyngeal exudate.  Dental Disease minimal, has teeth that are generally in good repair, some fillings present, no missing teeth, no swollen gingiva, mild tenderness to palpation and percussion over the right upper first and second premolar. No swelling or tenderness under the tongue, no trismus or torticollis.   Eyes: Conjunctivae are normal. No scleral icterus.  Neck: Normal range of motion. Neck supple. No thyromegaly present.  Cardiovascular: Normal rate and regular rhythm.   Pulmonary/Chest: Effort normal and breath sounds normal.  Lymphadenopathy:    She has no cervical adenopathy.  Neurological: She is alert.  Skin: Skin is warm and dry. No rash noted. She is not diaphoretic.    ED Course  Procedures (including critical care time) Labs Review Labs Reviewed - No data to display  Imaging Review No results found.  MDM   Final diagnoses:  Pain, dental    Dental pain, likely pulpitis, no other signs of abscess, oral antibiotics adequate at this time. Pain medications as below.   Meds given in ED:  Medications  HYDROcodone-acetaminophen (NORCO/VICODIN) 5-325 MG per tablet 2 tablet (not administered)    New Prescriptions   CLINDAMYCIN (CLEOCIN) 150 MG CAPSULE    Take 2 capsules (300 mg total) by mouth 3 (three) times daily. May dispense  as 150mg  capsules   HYDROCODONE-ACETAMINOPHEN (NORCO/VICODIN) 5-325 MG PER TABLET    Take 2 tablets by mouth every 4 (four) hours as needed.   NAPROXEN (NAPROSYN) 500 MG TABLET    Take 1 tablet (500 mg total) by mouth 2 (two) times daily with a meal.        Vida RollerBrian D Malaijah Houchen, MD 05/28/13 [phone]

## 2013-05-28 NOTE — ED Notes (Signed)
Pt reports she needs a root canal. Dental pain to upper right tooth. Pain 7/10 at present.

## 2013-07-31 ENCOUNTER — Other Ambulatory Visit: Payer: Self-pay | Admitting: Obstetrics and Gynecology

## 2013-10-18 ENCOUNTER — Telehealth: Payer: Self-pay | Admitting: Oncology

## 2013-10-18 NOTE — Telephone Encounter (Signed)
S/W PATIENT AND GAVE NP APPT FOR 02/25/14 @ 10:30 W/DR. SHADAD REFERRING DR. Daryel November DX- ANEMIA, IDA

## 2013-10-18 NOTE — Telephone Encounter (Signed)
C/D 10/18/13 for appt. 02/25/14

## 2014-01-20 ENCOUNTER — Emergency Department (HOSPITAL_COMMUNITY)
Admission: EM | Admit: 2014-01-20 | Discharge: 2014-01-20 | Disposition: A | Discharge status: Home / Self Care | Payer: BC Managed Care – PPO | Source: Home / Self Care | Attending: Family Medicine | Admitting: Family Medicine

## 2014-01-20 ENCOUNTER — Encounter (HOSPITAL_COMMUNITY): Payer: Self-pay | Admitting: Emergency Medicine

## 2014-01-20 DIAGNOSIS — H6504 Acute serous otitis media, recurrent, right ear: Secondary | ICD-10-CM

## 2014-01-20 DIAGNOSIS — J189 Pneumonia, unspecified organism: Secondary | ICD-10-CM

## 2014-01-20 DIAGNOSIS — J0101 Acute recurrent maxillary sinusitis: Secondary | ICD-10-CM

## 2014-01-20 MED ORDER — MINOCYCLINE HCL 100 MG PO CAPS: 100.0000 mg | ORAL_CAPSULE | Freq: Two times a day (BID) | ORAL | Status: DC

## 2014-01-20 MED ORDER — IPRATROPIUM BROMIDE 0.06 % NA SOLN: 2.0000 | Freq: Four times a day (QID) | NASAL | Status: DC

## 2014-01-20 NOTE — Discharge Instructions (Signed)
Take all of medicine , use tylenol or advil for pain and fever as needed, see your doctor in 10 - 14 days for ear recheck  °

## 2014-01-20 NOTE — ED Provider Notes (Signed)
CSN: [id number]     Arrival date & time 01/20/14  1923 History   First MD Initiated Contact with Patient 01/20/14 1943     Chief Complaint  Patient presents with  . Cough  . Otalgia  . Nasal Congestion   (Consider location/radiation/quality/duration/timing/severity/associated sxs/prior Treatment) Patient is a 23 y.o. female presenting with cough. The history is provided by the patient.  Cough Cough characteristics:  Non-productive and dry Severity:  Mild Onset quality:  Gradual Duration:  3 days Progression:  Unchanged Chronicity:  New Smoker: no   Relieved by:  None tried Worsened by:  Nothing tried Ineffective treatments:  None tried Associated symptoms: ear pain and rhinorrhea   Associated symptoms: no chills, no shortness of breath and no sinus congestion     Past Medical History  Diagnosis Date  . Migraines    Past Surgical History  Procedure Laterality Date  . Appendectomy    . Dilatation & curettage/hysteroscopy with trueclear N/A 05/09/2013    Procedure: DILATATION & CURETTAGE/HYSTEROSCOPY WITH TRUCLEAR;  Surgeon: Zelphia CairoGretchen Adkins, MD;  Location: [facility];  Service: Gynecology;  Laterality: N/A;  . Colposcopy N/A 05/09/2013    Procedure: COLPOSCOPY WITH ECC;  Surgeon: Zelphia CairoGretchen Adkins, MD;  Location: [facility];  Service: Gynecology;  Laterality: N/A;   History reviewed. No pertinent family history. History  Substance Use Topics  . Smoking status: Never Smoker   . Smokeless tobacco: Not on file  . Alcohol Use: Yes     Comment: occassional   OB History    No data available     Review of Systems  Constitutional: Negative.  Negative for chills.  HENT: Positive for ear pain, postnasal drip and rhinorrhea.   Respiratory: Positive for cough. Negative for shortness of breath.     Allergies  Penicillins  Home Medications   Prior to Admission medications   Medication Sig Start Date End Date Taking? Authorizing Provider  clindamycin (CLEOCIN) 150 MG capsule Take 2  capsules (300 mg total) by mouth 3 (three) times daily. May dispense as 150mg  capsules 05/28/13   Vida RollerBrian D Miller, MD  HYDROcodone-acetaminophen (NORCO) 7.5-325 MG per tablet Take 1 tablet by mouth every 6 (six) hours as needed for moderate pain.    Historical Provider, MD  HYDROcodone-acetaminophen (NORCO/VICODIN) 5-325 MG per tablet Take 2 tablets by mouth every 4 (four) hours as needed. 05/28/13   Vida RollerBrian D Miller, MD  ipratropium (ATROVENT) 0.06 % nasal spray Place 2 sprays into both nostrils 4 (four) times daily. 01/20/14   Linna HoffJames D Kindl, MD  minocycline (MINOCIN,DYNACIN) 100 MG capsule Take 1 capsule (100 mg total) by mouth 2 (two) times daily. 01/20/14   Linna HoffJames D Kindl, MD  naproxen (NAPROSYN) 500 MG tablet Take 1 tablet (500 mg total) by mouth 2 (two) times daily with a meal. 05/28/13   Vida RollerBrian D Miller, MD  Norethin Ace-Eth Estrad-FE (MINASTRIN 24 FE PO) Take 1 tablet by mouth every evening.    Historical Provider, MD   BP 109/79 mmHg  Pulse 74  Temp(Src) 98.3 F (36.8 C) (Oral)  Resp 14  SpO2 100%  LMP 01/07/2014 Physical Exam  Constitutional: She is oriented to person, place, and time. She appears well-developed and well-nourished. No distress.  HENT:  Head: Normocephalic.  Ears:  Mouth/Throat: Oropharynx is clear and moist.  Eyes: Pupils are equal, round, and reactive to light.  Neck: Normal range of motion. Neck supple.  Cardiovascular: Normal rate, normal heart sounds and intact distal pulses.   Pulmonary/Chest: Effort normal  and breath sounds normal. No respiratory distress. She has no wheezes.  Neurological: She is alert and oriented to person, place, and time.  Skin: Skin is warm and dry.  Nursing note and vitals reviewed.   ED Course  Procedures (including critical care time) Labs Review Labs Reviewed - No data to display  Imaging Review No results found.   MDM  Sx much improved after irrigation, tm nl.    Linna HoffJames D Kindl, MD 01/20/14 2035

## 2014-01-20 NOTE — ED Notes (Signed)
Pt states that she has had a cough with congestion and right ear pain for about 3 day now.

## 2014-02-19 ENCOUNTER — Other Ambulatory Visit: Payer: Self-pay | Admitting: Obstetrics and Gynecology

## 2014-02-20 LAB — CYTOLOGY - PAP

## 2014-02-21 ENCOUNTER — Other Ambulatory Visit: Payer: Self-pay | Admitting: Oncology

## 2014-02-21 DIAGNOSIS — D509 Iron deficiency anemia, unspecified: Secondary | ICD-10-CM

## 2014-02-25 ENCOUNTER — Ambulatory Visit (HOSPITAL_BASED_OUTPATIENT_CLINIC_OR_DEPARTMENT_OTHER): Payer: BC Managed Care – PPO | Admitting: Oncology

## 2014-02-25 ENCOUNTER — Encounter: Payer: Self-pay | Admitting: Oncology

## 2014-02-25 ENCOUNTER — Ambulatory Visit: Payer: BC Managed Care – PPO

## 2014-02-25 ENCOUNTER — Other Ambulatory Visit (HOSPITAL_BASED_OUTPATIENT_CLINIC_OR_DEPARTMENT_OTHER): Payer: BC Managed Care – PPO

## 2014-02-25 ENCOUNTER — Telehealth: Payer: Self-pay | Admitting: Oncology

## 2014-02-25 VITALS — BP 108/61 | HR 67 | Temp 98.4°F | Resp 20 | Ht 63.5 in | Wt 142.5 lb

## 2014-02-25 DIAGNOSIS — D509 Iron deficiency anemia, unspecified: Secondary | ICD-10-CM

## 2014-02-25 DIAGNOSIS — D500 Iron deficiency anemia secondary to blood loss (chronic): Secondary | ICD-10-CM

## 2014-02-25 LAB — CBC WITH DIFFERENTIAL/PLATELET
BASO%: 1 % (ref 0.0–2.0)
BASOS ABS: 0 10*3/uL (ref 0.0–0.1)
EOS ABS: 0.1 10*3/uL (ref 0.0–0.5)
EOS%: 2.6 % (ref 0.0–7.0)
HCT: 36 % (ref 34.8–46.6)
HEMOGLOBIN: 11.3 g/dL — AB (ref 11.6–15.9)
LYMPH%: 43 % (ref 14.0–49.7)
MCH: 26.6 pg (ref 25.1–34.0)
MCHC: 31.3 g/dL — ABNORMAL LOW (ref 31.5–36.0)
MCV: 84.9 fL (ref 79.5–101.0)
MONO#: 0.4 10*3/uL (ref 0.1–0.9)
MONO%: 9.1 % (ref 0.0–14.0)
NEUT%: 44.3 % (ref 38.4–76.8)
NEUTROS ABS: 2.1 10*3/uL (ref 1.5–6.5)
Platelets: 256 10*3/uL (ref 145–400)
RBC: 4.24 10*6/uL (ref 3.70–5.45)
RDW: 14.1 % (ref 11.2–14.5)
WBC: 4.7 10*3/uL (ref 3.9–10.3)
lymph#: 2 10*3/uL (ref 0.9–3.3)

## 2014-02-25 LAB — COMPREHENSIVE METABOLIC PANEL (CC13)
ALT: 14 U/L (ref 0–55)
AST: 16 U/L (ref 5–34)
Albumin: 3.7 g/dL (ref 3.5–5.0)
Alkaline Phosphatase: 64 U/L (ref 40–150)
Anion Gap: 8 mEq/L (ref 3–11)
BILIRUBIN TOTAL: 0.45 mg/dL (ref 0.20–1.20)
BUN: 10.3 mg/dL (ref 7.0–26.0)
CALCIUM: 8.3 mg/dL — AB (ref 8.4–10.4)
CO2: 20 mEq/L — ABNORMAL LOW (ref 22–29)
CREATININE: 0.8 mg/dL (ref 0.6–1.1)
Chloride: 114 mEq/L — ABNORMAL HIGH (ref 98–109)
EGFR: >90 mL/min/{1.73_m2} (ref >90)
Glucose: 79 mg/dl (ref 70–140)
POTASSIUM: 3.6 meq/L (ref 3.5–5.1)
SODIUM: 141 meq/L (ref 136–145)
TOTAL PROTEIN: 6.8 g/dL (ref 6.4–8.3)

## 2014-02-25 LAB — IRON AND TIBC CHCC
%SAT: 27 % (ref 21–57)
Iron: 77 ug/dL (ref 41–142)
TIBC: 281 ug/dL (ref 236–444)
UIBC: 205 ug/dL (ref 120–384)

## 2014-02-25 LAB — FERRITIN CHCC: Ferritin: 22 ng/ml (ref 9–269)

## 2014-02-25 NOTE — Progress Notes (Signed)
Please see consult note.  

## 2014-02-25 NOTE — Consult Note (Signed)
Reason for Referral: Iron deficiency anemia   HPI: 24 year old woman native of [place] where she lived the majority of her life. She was diagnosed with iron deficiency anemia and [facility] her iron deficiency. She was receiving iron infusion periodically. She had been tried on oral iron supplements and did not tolerated it well. She received 1000 mg of IV iron on March 2010 and that was repeated in January 2012. But none since. She have had heavy menstrual cycles for long time but recently have slowed down comparatively. She is establishing care locally as she prefer not to travel back to [place]. Clinically, she does report some fatigue and sluggishness. She also reports cold sensation but no ice cravings. She does report occasional migraines but no blurry vision or syncope. She does not report any fevers, chills, sweats, weight loss. She does not report any chest pain, palpitation, orthopnea or PND. She does not report any cough, hemoptysis or hematemesis. She does not report any hematochezia, melena, hemoptysis or hematemesis. She does not report any hematuria, dysuria or genitourinary complaints. She does not report any skeletal complaints. Rest of review of systems unremarkable.   Past Medical History  Diagnosis Date  . Migraines   :  Past Surgical History  Procedure Laterality Date  . Appendectomy    . Dilatation & curettage/hysteroscopy with trueclear N/A 05/09/2013    Procedure: DILATATION & CURETTAGE/HYSTEROSCOPY WITH TRUCLEAR;  Surgeon: Zelphia CairoGretchen Adkins, MD;  Location: [facility];  Service: Gynecology;  Laterality: N/A;  . Colposcopy N/A 05/09/2013    Procedure: COLPOSCOPY WITH ECC;  Surgeon: Zelphia CairoGretchen Adkins, MD;  Location: [facility];  Service: Gynecology;  Laterality: N/A;  :   Current outpatient prescriptions:  .  baclofen (LIORESAL) 10 MG tablet, Take 10 mg by mouth 2 (two) times daily., Disp: , Rfl:  .  topiramate (TOPAMAX) 100 MG tablet,  Take 100 mg by mouth daily as needed. Use for migraines, Disp: , Rfl: 0:  Allergies  Allergen Reactions  . Penicillins Hives    Patient doesn't remember what type of reaction.  :  No family history on file.:  History   Social History  . Marital Status: Single    Spouse Name: N/A    Number of Children: N/A  . Years of Education: N/A   Occupational History  . Not on file.   Social History Main Topics  . Smoking status: Never Smoker   . Smokeless tobacco: Not on file  . Alcohol Use: Yes     Comment: occassional  . Drug Use: No  . Sexual Activity: Not on file   Other Topics Concern  . Not on file   Social History Narrative  :  Pertinent items are noted in HPI.  Exam: ECOG 0 Blood pressure 108/61, pulse 67, temperature 98.4 F (36.9 C), temperature source Oral, resp. rate 20, height 5' 3.5" (1.613 m), weight 142 lb 8 oz (64.638 kg), SpO2 100 %. General appearance: alert and cooperative Head: Normocephalic, without obvious abnormality Throat: lips, mucosa, and tongue normal; teeth and gums normal Neck: no adenopathy Back: negative Resp: clear to auscultation bilaterally Chest wall: no tenderness Cardio: regular rate and rhythm, S1, S2 normal, no murmur, click, rub or gallop GI: soft, non-tender; bowel sounds normal; no masses,  no organomegaly Extremities: extremities normal, atraumatic, no cyanosis or edema Pulses: 2+ and symmetric Skin: Skin color, texture, turgor normal. No rashes or lesions   Recent Labs  02/25/14 1042  WBC 4.7  HGB 11.3*  HCT 36.0  PLT 256    Recent Labs  02/25/14 1042  NA 141  K 3.6  CO2 20*  GLUCOSE 79  BUN 10.3  CREATININE 0.8  CALCIUM 8.3*     Assessment and Plan:    24 year old woman with the following issues:  1. Iron deficiency anemia: This has been documented on multiple occasions and used to be followed by a hematologist in [facility]. She received IV iron previously in 2010 and 2012. Most recently,  she is developing symptoms of cold, sluggishness and fatigue. Her hemoglobin today is 11.3 and her iron studies are currently pending. I anticipate that her iron levels may be drifting down at this time given her menstrual blood losses. Risks and benefits of IV iron were discussed today especially in the form of Feraheme. These complications include arthralgias, myalgias, infusion-related reactions and rarely anaphylaxis. I plan to give her 1000 mg in split doses a week apart in the near future. She is agreeable to proceed with this plan.  2. Menorrhagia: She follows up with her gynecologist regarding this issue. She has been started on oral contraceptives which have helped some with her symptoms.  3. Follow-up: Will be in 6 months to recheck her hemoglobin and iron studies.

## 2014-02-25 NOTE — Progress Notes (Signed)
Checked in new pt with no financial concerns prior to seeing the dr.  Pt is here for a hematology concern so financial assistance may not be needed but she has my card for any billing or insurance questions or concerns. ° °

## 2014-02-25 NOTE — Telephone Encounter (Signed)
gv and printed appt sched and avs for pt for Feb and Aug...sed aded tx.

## 2014-02-28 ENCOUNTER — Ambulatory Visit (HOSPITAL_BASED_OUTPATIENT_CLINIC_OR_DEPARTMENT_OTHER): Payer: BC Managed Care – PPO

## 2014-02-28 DIAGNOSIS — D509 Iron deficiency anemia, unspecified: Secondary | ICD-10-CM

## 2014-02-28 MED ORDER — SODIUM CHLORIDE 0.9 % IV SOLN
Freq: Once | INTRAVENOUS | Status: AC
  Administered 2014-02-28: 08:00:00 via INTRAVENOUS

## 2014-02-28 MED ORDER — SODIUM CHLORIDE 0.9 % IV SOLN
510.0000 mg | Freq: Once | INTRAVENOUS | Status: AC
  Administered 2014-02-28: 510 mg via INTRAVENOUS
  Filled 2014-02-28: qty 17

## 2014-02-28 NOTE — Patient Instructions (Signed)

## 2014-03-07 ENCOUNTER — Ambulatory Visit (HOSPITAL_BASED_OUTPATIENT_CLINIC_OR_DEPARTMENT_OTHER): Payer: BC Managed Care – PPO

## 2014-03-07 DIAGNOSIS — D509 Iron deficiency anemia, unspecified: Secondary | ICD-10-CM

## 2014-03-07 MED ORDER — SODIUM CHLORIDE 0.9 % IJ SOLN
3.0000 mL | Freq: Once | INTRAMUSCULAR | Status: DC | PRN
  Filled 2014-03-07: qty 10

## 2014-03-07 MED ORDER — SODIUM CHLORIDE 0.9 % IJ SOLN
10.0000 mL | INTRAMUSCULAR | Status: DC | PRN
  Filled 2014-03-07: qty 10

## 2014-03-07 MED ORDER — HEPARIN SOD (PORK) LOCK FLUSH 100 UNIT/ML IV SOLN
500.0000 [IU] | Freq: Once | INTRAVENOUS | Status: DC | PRN
  Filled 2014-03-07: qty 5

## 2014-03-07 MED ORDER — SODIUM CHLORIDE 0.9 % IV SOLN
Freq: Once | INTRAVENOUS | Status: AC
  Administered 2014-03-07: 08:00:00 via INTRAVENOUS

## 2014-03-07 MED ORDER — HEPARIN SOD (PORK) LOCK FLUSH 100 UNIT/ML IV SOLN
250.0000 [IU] | Freq: Once | INTRAVENOUS | Status: DC | PRN
  Filled 2014-03-07: qty 5

## 2014-03-07 MED ORDER — SODIUM CHLORIDE 0.9 % IV SOLN
510.0000 mg | Freq: Once | INTRAVENOUS | Status: AC
  Administered 2014-03-07: 510 mg via INTRAVENOUS
  Filled 2014-03-07: qty 17

## 2014-03-07 MED ORDER — ALTEPLASE 2 MG IJ SOLR
2.0000 mg | Freq: Once | INTRAMUSCULAR | Status: DC | PRN
  Filled 2014-03-07: qty 2

## 2014-03-07 NOTE — Patient Instructions (Signed)

## 2014-08-26 ENCOUNTER — Ambulatory Visit (HOSPITAL_BASED_OUTPATIENT_CLINIC_OR_DEPARTMENT_OTHER): Payer: BC Managed Care – PPO | Admitting: Physician Assistant

## 2014-08-26 ENCOUNTER — Other Ambulatory Visit (HOSPITAL_BASED_OUTPATIENT_CLINIC_OR_DEPARTMENT_OTHER): Payer: BC Managed Care – PPO

## 2014-08-26 ENCOUNTER — Encounter: Payer: Self-pay | Admitting: Physician Assistant

## 2014-08-26 ENCOUNTER — Telehealth: Payer: Self-pay | Admitting: Physician Assistant

## 2014-08-26 VITALS — BP 119/74 | HR 72 | Temp 98.5°F | Resp 18 | Ht 63.5 in | Wt 151.7 lb

## 2014-08-26 DIAGNOSIS — D509 Iron deficiency anemia, unspecified: Secondary | ICD-10-CM

## 2014-08-26 DIAGNOSIS — D500 Iron deficiency anemia secondary to blood loss (chronic): Secondary | ICD-10-CM

## 2014-08-26 LAB — CBC WITH DIFFERENTIAL/PLATELET
BASO%: 0.5 % (ref 0.0–2.0)
Basophils Absolute: 0 10*3/uL (ref 0.0–0.1)
EOS%: 2.8 % (ref 0.0–7.0)
Eosinophils Absolute: 0.1 10*3/uL (ref 0.0–0.5)
HEMATOCRIT: 36.7 % (ref 34.8–46.6)
HGB: 11.9 g/dL (ref 11.6–15.9)
LYMPH%: 46 % (ref 14.0–49.7)
MCH: 27.6 pg (ref 25.1–34.0)
MCHC: 32.4 g/dL (ref 31.5–36.0)
MCV: 85.2 fL (ref 79.5–101.0)
MONO#: 0.4 10*3/uL (ref 0.1–0.9)
MONO%: 9.4 % (ref 0.0–14.0)
NEUT%: 41.3 % (ref 38.4–76.8)
NEUTROS ABS: 1.9 10*3/uL (ref 1.5–6.5)
PLATELETS: 234 10*3/uL (ref 145–400)
RBC: 4.31 10*6/uL (ref 3.70–5.45)
RDW: 13.2 % (ref 11.2–14.5)
WBC: 4.5 10*3/uL (ref 3.9–10.3)
lymph#: 2.1 10*3/uL (ref 0.9–3.3)

## 2014-08-26 LAB — IRON AND TIBC CHCC
%SAT: 30 % (ref 21–57)
Iron: 75 ug/dL (ref 41–142)
TIBC: 250 ug/dL (ref 236–444)
UIBC: 175 ug/dL (ref 120–384)

## 2014-08-26 LAB — FERRITIN CHCC: Ferritin: 97 ng/ml (ref 9–269)

## 2014-08-26 NOTE — Progress Notes (Signed)
Hematology and Oncology Follow Up Visit  Peggy Kelly [id number] 04/23/1990 24 y.o. 08/26/2014 8:57 AM  Principle Diagnosis:  Iron deficiency anemia   Prior Therapy: She is received IV iron thousand milligrams in March 2010 and again in January 2012. She did not tolerate oral iron supplements  Current therapy: Observation  Interim History:  Peggy Kelly presents for a six-month follow-up visit. In the interim she has been feeling well and has had no hospitalizations. She reports that her menstrual cycles remain heavy and [facility] cycles. Her last cycle was a bit strange in that it was light in the beginning in heavier towards the end. She is due for follow-up appointment with her gynecologist in the next couple of months or so and will follow through with this appointment. She denies any shortness of breath no other bleeding other than menstrual cycle bleeding. She denied any fevers, chills, night sweats or significant weight loss. She denied any chest pain palpitations orthopnea or PND. Denied cough hemoptysis or hematemesis. Denies any hematochezia or melanoma hemoptysis or hematemesis, no hematuria dysuria or GU complaints. She denies any pain.  Medications: I have reviewed the patient's current medications.  Allergies:  Allergies  Allergen Reactions  . Penicillins Hives    Patient doesn't remember what type of reaction.    Past Medical History, Surgical history, Social history, and Family History were reviewed and updated.  Review of Systems: Review of Systems  Constitutional: Negative for fever, chills, weight loss, malaise/fatigue and diaphoresis.  HENT: Negative for congestion, ear discharge, ear pain, hearing loss, nosebleeds, sore throat and tinnitus.   Eyes: Negative for blurred vision, double vision, photophobia, pain, discharge and redness.  Respiratory: Negative for cough, hemoptysis, sputum production,  shortness of breath, wheezing and stridor.   Cardiovascular: Negative for chest pain, palpitations, orthopnea, claudication, leg swelling and PND.  Gastrointestinal: Negative for heartburn, nausea, vomiting, abdominal pain, diarrhea, constipation, blood in stool and melena.  Genitourinary: Negative.        Continues to have heavy menses, followed by her gynecologist  Musculoskeletal: Negative.   Skin: Negative.   Neurological: Negative for dizziness, tingling, focal weakness, seizures, weakness and headaches.  Endo/Heme/Allergies: Does not bruise/bleed easily.  Psychiatric/Behavioral: Negative for depression. The patient is not nervous/anxious and does not have insomnia.     Remaining ROS negative.  Physical Exam: Blood pressure 119/74, pulse 72, temperature 98.5 F (36.9 C), temperature source Oral, resp. rate 18, height 5' 3.5" (1.613 m), weight 151 lb 11.2 oz (68.811 kg), SpO2 100 %. ECOG:  Physical Exam  Constitutional: She is oriented to person, place, and time and well-developed, well-nourished, and in no distress.  HENT:  Head: Normocephalic and atraumatic.  Mouth/Throat: Oropharynx is clear and moist.  Eyes: Pupils are equal, round, and reactive to light.  Neck: Normal range of motion. Neck supple. No JVD present. No tracheal deviation present. No thyromegaly present.  Cardiovascular: Normal rate, regular rhythm, normal heart sounds and intact distal pulses.  Exam reveals no gallop and no friction rub.   No murmur heard. Pulmonary/Chest: Effort normal and breath sounds normal. No respiratory distress. She has no wheezes. She has no rales.  Abdominal: Soft. Bowel sounds are normal. She exhibits no distension and no mass. There is no tenderness.  Musculoskeletal: Normal range of motion. She exhibits no edema or tenderness.  Lymphadenopathy:    She has no cervical adenopathy.  Neurological: She is alert and oriented to person,  place, and time. She has normal reflexes. Gait  normal.  Skin: Skin is warm and dry. No rash noted.      Lab Results: Lab Results  Component Value Date   WBC 4.5 08/26/2014   HGB 11.9 08/26/2014   HCT 36.7 08/26/2014   MCV 85.2 08/26/2014   PLT 234 08/26/2014     Chemistry      Component Value Date/Time   NA 141 02/25/2014 1042   NA 137 05/25/2009 1736   K 3.6 02/25/2014 1042   K 3.5 05/25/2009 1736   CL 104 05/25/2009 1736   CO2 20* 02/25/2014 1042   CO2 26 05/25/2009 1736   BUN 10.3 02/25/2014 1042   BUN 5* 05/25/2009 1736   CREATININE 0.8 02/25/2014 1042   CREATININE 0.68 05/25/2009 1736      Component Value Date/Time   CALCIUM 8.3* 02/25/2014 1042   CALCIUM 8.9 05/25/2009 1736   ALKPHOS 64 02/25/2014 1042   AST 16 02/25/2014 1042   ALT 14 02/25/2014 1042   BILITOT 0.45 02/25/2014 1042       Radiological Studies: No results found.  Impression and Plan: The patient is a very pleasant 24 year old African-American female with a history of iron deficiency anemia secondary to heavy menstrual bleeding. She last received IV iron in January 2012. She is currently feeling well and her last iron studies were in the low normal range. We will continue to monitor her periodically and have recommended that she get in touch should she have a significant change in her menstrual cycles causing more bleeding or she becomes more symptomatic. Will plan to have her follow-up in 6 months with repeat CBC differential and iron studies. We can certainly see her sooner and arrange IV iron infusion if/when needed.  Spent more than half the time coordinating care.    Tiana Loft E, PA-C 8/2/20168:57 AM

## 2014-08-26 NOTE — Telephone Encounter (Signed)
Pt confirmed labs/ov per 08/02 POF, gave pt avs and calendar.... KJ °

## 2014-08-26 NOTE — Patient Instructions (Signed)
Follow-up in 6 months for reevaluation or sooner if needed. If that your iron studies show that you need an IV iron infusion you will be contacted and scheduled for this procedure.

## 2014-09-18 ENCOUNTER — Encounter (HOSPITAL_COMMUNITY): Payer: Self-pay | Admitting: *Deleted

## 2014-09-18 ENCOUNTER — Emergency Department (HOSPITAL_COMMUNITY)
Admission: EM | Admit: 2014-09-18 | Discharge: 2014-09-18 | Disposition: A | Discharge status: Home / Self Care | Payer: BC Managed Care – PPO | Attending: Emergency Medicine | Admitting: Emergency Medicine

## 2014-09-18 DIAGNOSIS — M545 Low back pain, unspecified: Secondary | ICD-10-CM

## 2014-09-18 DIAGNOSIS — Z880 Allergy status to penicillin: Secondary | ICD-10-CM | POA: Insufficient documentation

## 2014-09-18 DIAGNOSIS — M542 Cervicalgia: Secondary | ICD-10-CM | POA: Insufficient documentation

## 2014-09-18 DIAGNOSIS — Z79899 Other long term (current) drug therapy: Secondary | ICD-10-CM | POA: Insufficient documentation

## 2014-09-18 DIAGNOSIS — G43909 Migraine, unspecified, not intractable, without status migrainosus: Secondary | ICD-10-CM | POA: Insufficient documentation

## 2014-09-18 HISTORY — DX: Other seasonal allergic rhinitis: J30.2

## 2014-09-18 MED ORDER — HYDROCODONE-ACETAMINOPHEN 5-325 MG PO TABS: 2.0000 | ORAL_TABLET | ORAL | Status: DC | PRN

## 2014-09-18 MED ORDER — IBUPROFEN 400 MG PO TABS
800.0000 mg | ORAL_TABLET | Freq: Once | ORAL | Status: AC
Start: 2014-09-18 — End: 2014-09-18
  Administered 2014-09-18: 800 mg via ORAL
  Filled 2014-09-18: qty 2

## 2014-09-18 MED ORDER — IBUPROFEN 800 MG PO TABS: 800.0000 mg | ORAL_TABLET | Freq: Three times a day (TID) | ORAL | Status: DC

## 2014-09-18 MED ORDER — METHOCARBAMOL 500 MG PO TABS: 500.0000 mg | ORAL_TABLET | Freq: Two times a day (BID) | ORAL | Status: DC

## 2014-09-18 MED ORDER — METHOCARBAMOL 500 MG PO TABS
500.0000 mg | ORAL_TABLET | Freq: Once | ORAL | Status: AC
  Administered 2014-09-18: 500 mg via ORAL
  Filled 2014-09-18: qty 1

## 2014-09-18 NOTE — ED Notes (Signed)
Declined W/C at D/C and was escorted to lobby by RN. 
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
## 2014-09-18 NOTE — Discharge Instructions (Signed)

## 2015-02-25 ENCOUNTER — Other Ambulatory Visit: Payer: Self-pay | Admitting: *Deleted

## 2015-02-25 ENCOUNTER — Encounter: Payer: Self-pay | Admitting: *Deleted

## 2015-02-25 DIAGNOSIS — D509 Iron deficiency anemia, unspecified: Secondary | ICD-10-CM

## 2015-02-26 ENCOUNTER — Ambulatory Visit: Payer: BC Managed Care – PPO | Admitting: Oncology

## 2015-02-26 ENCOUNTER — Other Ambulatory Visit: Payer: BC Managed Care – PPO

## 2015-02-26 ENCOUNTER — Telehealth: Payer: Self-pay | Admitting: Oncology

## 2015-02-26 NOTE — Telephone Encounter (Signed)
pt cld to r/s appt-gave pt r/s time & date °

## 2015-03-18 ENCOUNTER — Other Ambulatory Visit: Payer: Self-pay | Admitting: Oncology

## 2015-03-19 ENCOUNTER — Telehealth: Payer: Self-pay | Admitting: Oncology

## 2015-03-19 NOTE — Telephone Encounter (Signed)
Moved pt's MD visit to 03/03 per 02/22 POF.... Doristine Church

## 2015-03-20 ENCOUNTER — Other Ambulatory Visit: Payer: BC Managed Care – PPO

## 2015-03-20 ENCOUNTER — Ambulatory Visit: Payer: BC Managed Care – PPO | Admitting: Oncology

## 2015-03-23 ENCOUNTER — Other Ambulatory Visit (HOSPITAL_BASED_OUTPATIENT_CLINIC_OR_DEPARTMENT_OTHER): Payer: BC Managed Care – PPO

## 2015-03-23 DIAGNOSIS — D509 Iron deficiency anemia, unspecified: Secondary | ICD-10-CM | POA: Diagnosis not present

## 2015-03-23 LAB — CBC WITH DIFFERENTIAL/PLATELET
BASO%: 0.8 % (ref 0.0–2.0)
Basophils Absolute: 0 10*3/uL (ref 0.0–0.1)
EOS%: 2.3 % (ref 0.0–7.0)
Eosinophils Absolute: 0.1 10*3/uL (ref 0.0–0.5)
HCT: 36 % (ref 34.8–46.6)
HGB: 11.4 g/dL — ABNORMAL LOW (ref 11.6–15.9)
LYMPH%: 48.2 % (ref 14.0–49.7)
MCH: 26.8 pg (ref 25.1–34.0)
MCHC: 31.7 g/dL (ref 31.5–36.0)
MCV: 84.6 fL (ref 79.5–101.0)
MONO#: 0.5 10*3/uL (ref 0.1–0.9)
MONO%: 9.1 % (ref 0.0–14.0)
NEUT%: 39.6 % (ref 38.4–76.8)
NEUTROS ABS: 2.2 10*3/uL (ref 1.5–6.5)
Platelets: 262 10*3/uL (ref 145–400)
RBC: 4.26 10*6/uL (ref 3.70–5.45)
RDW: 13.1 % (ref 11.2–14.5)
WBC: 5.6 10*3/uL (ref 3.9–10.3)
lymph#: 2.7 10*3/uL (ref 0.9–3.3)

## 2015-03-24 LAB — IRON AND TIBC
%SAT: 21 % (ref 21–57)
IRON: 59 ug/dL (ref 41–142)
TIBC: 288 ug/dL (ref 236–444)
UIBC: 228 ug/dL (ref 120–384)

## 2015-03-24 LAB — FERRITIN: FERRITIN: 31 ng/mL (ref 9–269)

## 2015-03-27 ENCOUNTER — Telehealth: Payer: Self-pay | Admitting: Oncology

## 2015-03-27 ENCOUNTER — Ambulatory Visit (HOSPITAL_BASED_OUTPATIENT_CLINIC_OR_DEPARTMENT_OTHER): Payer: BC Managed Care – PPO | Admitting: Oncology

## 2015-03-27 VITALS — BP 124/58 | HR 78 | Temp 98.0°F | Resp 19 | Ht 64.0 in | Wt 163.8 lb

## 2015-03-27 DIAGNOSIS — D509 Iron deficiency anemia, unspecified: Secondary | ICD-10-CM | POA: Diagnosis not present

## 2015-03-27 DIAGNOSIS — N920 Excessive and frequent menstruation with regular cycle: Secondary | ICD-10-CM | POA: Diagnosis not present

## 2015-03-27 NOTE — Telephone Encounter (Signed)
per pof to sch pt appt-gave pt copy of avs °

## 2015-03-27 NOTE — Progress Notes (Signed)
Hematology and Oncology Follow Up Visit  JOHANNA MATTO [id number] 1990-10-28 25 y.o. 03/27/2015 3:31 PM No PCP Per PatientNo ref. provider found   Principle Diagnosis: 25 year old woman with iron deficiency anemia dating back to 2010. Anemia related to menorrhagia.   Prior Therapy: IV iron replacement on multiple occasions most recent switch in February 2016. She cannot tolerate oral iron.  Current therapy: Observation and surveillance and intravenous iron periodically.  Interim History: Ms. Manwarren presents today for a follow-up visit. Since the last visit, she'll been doing fairly well without any major complaints. She does not report any recent bleeding such as hematochezia or melena she continues to have heavy menses at times. She denied any fatigue or tiredness. She does report some occasional cold intolerance. She did not report any ice cravings. She continues to perform activities of daily living without any decline.  She does report occasional migraines but no blurry vision or syncope. She does not report any fevers, chills, sweats, weight loss. She does not report any chest pain, palpitation, orthopnea or PND. She does not report any cough, hemoptysis or hematemesis. She does not report any hematochezia, melena, hemoptysis or hematemesis. She does not report any hematuria, dysuria or genitourinary complaints. She does not report any skeletal complaints. Rest of review of systems unremarkable.   Medications: I have reviewed the patient's current medications.  Current Outpatient Prescriptions  Medication Sig Dispense Refill  . Prenatal Vit-Fe Fumarate-FA (PRENATAL VITAMIN PO) Take 1 capsule by mouth daily.     No current facility-administered medications for this visit.     Allergies:  Allergies  Allergen Reactions  . Penicillins Hives    Patient doesn't remember what type of reaction.    Past Medical History, Surgical history, Social history, and Family History were  reviewed and updated.   Physical Exam: Blood pressure 124/58, pulse 78, temperature 98 F (36.7 C), temperature source Oral, resp. rate 19, height  (1.626 m), weight 163 lb 12.8 oz (74.299 kg), SpO2 100 %. ECOG: 0 General appearance: alert and cooperative Head: Normocephalic, without obvious abnormality Neck: no adenopathy Lymph nodes: Cervical, supraclavicular, and axillary nodes normal. Heart:regular rate and rhythm, S1, S2 normal, no murmur, click, rub or gallop Lung:chest clear, no wheezing, rales, normal symmetric air entry Abdomin: soft, non-tender, without masses or organomegaly EXT:no erythema, induration, or nodules   Lab Results: Lab Results  Component Value Date   WBC 5.6 03/23/2015   HGB 11.4* 03/23/2015   HCT 36.0 03/23/2015   MCV 84.6 03/23/2015   PLT 262 03/23/2015     Chemistry      Component Value Date/Time   NA 141 02/25/2014 1042   NA 137 05/25/2009 1736   K 3.6 02/25/2014 1042   K 3.5 05/25/2009 1736   CL 104 05/25/2009 1736   CO2 20* 02/25/2014 1042   CO2 26 05/25/2009 1736   BUN 10.3 02/25/2014 1042   BUN 5* 05/25/2009 1736   CREATININE 0.8 02/25/2014 1042   CREATININE 0.68 05/25/2009 1736      Component Value Date/Time   CALCIUM 8.3* 02/25/2014 1042   CALCIUM 8.9 05/25/2009 1736   ALKPHOS 64 02/25/2014 1042   AST 16 02/25/2014 1042   ALT 14 02/25/2014 1042   BILITOT 0.45 02/25/2014 1042      Results for AVIANNA, MOYNAHAN (MRN [id number]) as of 03/27/2015 15:22  Ref. Range 03/23/2015 16:05  Iron Latest Ref Range: 41-142 ug/dL 59  UIBC Latest Ref Range: 120-384 ug/dL 528  TIBC Latest Ref  Range: 236-444 ug/dL 914288  %SAT Latest Ref Range: 21-57 % 21  Ferritin Latest Ref Range: 9-269 ng/ml 31     Impression and Plan:  25 year old woman with the following issues:  1. Iron deficiency anemia: Etiology likely related to heavy menstrual bleeding that have required IV iron on multiple occasions dating back to 2010. She have a relocated to  this area and received IV iron in 2016.   Clinically she is doing well and laboratory data from February 2017 were reviewed and continues to be within normal range. I recommend continue follow-up and recommend treatment with IV iron in the future as needed. She has tolerated therapy in the past well with significant improvement in her symptoms.  2. Menorrhagia: She follows up with gynecology regarding this issue.  3. Follow-up: Will be in 3 months to recheck your iron stores.    Eli HoseSHADAD,FIRAS, MD 3/3/20173:31 PM

## 2015-06-26 ENCOUNTER — Other Ambulatory Visit: Payer: BC Managed Care – PPO

## 2015-06-26 ENCOUNTER — Telehealth: Payer: Self-pay | Admitting: Oncology

## 2015-06-26 NOTE — Telephone Encounter (Signed)
pt called to r/s appt...done....pt ok and aware of new d.t °

## 2015-06-30 ENCOUNTER — Other Ambulatory Visit (HOSPITAL_BASED_OUTPATIENT_CLINIC_OR_DEPARTMENT_OTHER): Payer: 59

## 2015-06-30 ENCOUNTER — Telehealth: Payer: Self-pay | Admitting: Oncology

## 2015-06-30 DIAGNOSIS — D509 Iron deficiency anemia, unspecified: Secondary | ICD-10-CM | POA: Diagnosis not present

## 2015-06-30 LAB — CBC WITH DIFFERENTIAL/PLATELET
BASO%: 0.5 % (ref 0.0–2.0)
Basophils Absolute: 0 10*3/uL (ref 0.0–0.1)
EOS%: 2.7 % (ref 0.0–7.0)
Eosinophils Absolute: 0.2 10*3/uL (ref 0.0–0.5)
HEMATOCRIT: 34.7 % — AB (ref 34.8–46.6)
HEMOGLOBIN: 11.2 g/dL — AB (ref 11.6–15.9)
LYMPH#: 2.7 10*3/uL (ref 0.9–3.3)
LYMPH%: 49.7 % (ref 14.0–49.7)
MCH: 27.4 pg (ref 25.1–34.0)
MCHC: 32.3 g/dL (ref 31.5–36.0)
MCV: 84.8 fL (ref 79.5–101.0)
MONO#: 0.4 10*3/uL (ref 0.1–0.9)
MONO%: 7.7 % (ref 0.0–14.0)
NEUT#: 2.2 10*3/uL (ref 1.5–6.5)
NEUT%: 39.4 % (ref 38.4–76.8)
Platelets: 255 10*3/uL (ref 145–400)
RBC: 4.09 10*6/uL (ref 3.70–5.45)
RDW: 13.4 % (ref 11.2–14.5)
WBC: 5.5 10*3/uL (ref 3.9–10.3)

## 2015-06-30 NOTE — Telephone Encounter (Signed)
S.w pt and advised on June and July appt...the patient requested to move appts.

## 2015-07-01 ENCOUNTER — Other Ambulatory Visit: Payer: BC Managed Care – PPO

## 2015-07-01 LAB — IRON AND TIBC
%SAT: 19 % — AB (ref 21–57)
Iron: 53 ug/dL (ref 41–142)
TIBC: 283 ug/dL (ref 236–444)
UIBC: 230 ug/dL (ref 120–384)

## 2015-07-01 LAB — FERRITIN: FERRITIN: 21 ng/mL (ref 9–269)

## 2015-07-02 ENCOUNTER — Ambulatory Visit: Payer: BC Managed Care – PPO | Admitting: Oncology

## 2015-08-12 ENCOUNTER — Ambulatory Visit (HOSPITAL_BASED_OUTPATIENT_CLINIC_OR_DEPARTMENT_OTHER): Payer: 59 | Admitting: Oncology

## 2015-08-12 ENCOUNTER — Telehealth: Payer: Self-pay | Admitting: Oncology

## 2015-08-12 VITALS — BP 119/58 | HR 75 | Temp 98.1°F | Resp 18 | Ht 64.0 in | Wt 172.4 lb

## 2015-08-12 DIAGNOSIS — D500 Iron deficiency anemia secondary to blood loss (chronic): Secondary | ICD-10-CM | POA: Diagnosis not present

## 2015-08-12 DIAGNOSIS — N920 Excessive and frequent menstruation with regular cycle: Secondary | ICD-10-CM

## 2015-08-12 DIAGNOSIS — D509 Iron deficiency anemia, unspecified: Secondary | ICD-10-CM

## 2015-08-12 NOTE — Telephone Encounter (Signed)
Gave pt cal & avs °

## 2015-08-12 NOTE — Progress Notes (Signed)
Hematology and Oncology Follow Up Visit  Peggy PlattShontia Kelly Kelly [id number] [birth year] 25 y.o. 08/12/2015 4:00 PM No PCP Per PatientNo ref. provider found   Principle Diagnosis: 25 year old woman with iron deficiency anemia dating back to 2010. Anemia related to menorrhagia.   Prior Therapy: IV iron replacement on multiple occasions most recent switch in February 2016. She cannot tolerate oral iron.  Current therapy: Observation and surveillance and intravenous iron periodically.  Interim History: Peggy Kelly presents today for a follow-up visit. Since the last visit, she reports no changes in her health. She denies any excessive fatigue or tiredness. She denied any ice cravings. She does not report any recent bleeding such as hematochezia or melena she continues to have heavy menses at times. She does report some occasional cold intolerance. She continues to perform activities of daily living without any decline. She continues to work full time.  She does not report any neurological deficits. She does not have blurry vision or syncope. She does not report any fevers, chills, sweats, weight loss. She does not report any chest pain, palpitation, orthopnea or PND. She does not report any cough, hemoptysis or hematemesis. She does not report any hematochezia, melena, hemoptysis or hematemesis. She does not report any hematuria, dysuria or genitourinary complaints. She does not report any skeletal complaints. Rest of review of systems unremarkable.   Medications: I have reviewed the patient's current medications.  Current Outpatient Prescriptions  Medication Sig Dispense Refill  . Prenatal Vit-Fe Fumarate-FA (PRENATAL VITAMIN PO) Take 1 capsule by mouth daily.     No current facility-administered medications for this visit.     Allergies:  Allergies  Allergen Reactions  . Penicillins Hives    Patient doesn't remember what type of reaction.    Past Medical History, Surgical history, Social  history, and Family History were reviewed and updated.   Physical Exam: Blood pressure 119/58, pulse 75, temperature 98.1 F (36.7 C), temperature source Oral, resp. rate 18, height 5\' 4"  (1.626 m), weight 172 lb 6.4 oz (78.2 kg), SpO2 100 %. ECOG: 0 General appearance: alert and cooperative appeared without distress. Head: Normocephalic, without obvious abnormality no oral ulcers or lesions. Neck: no adenopathy Lymph nodes: Cervical, supraclavicular, and axillary nodes normal. Heart:regular rate and rhythm, S1, S2 normal, no murmur, click, rub or gallop Lung:chest clear, no wheezing, rales, normal symmetric air entry Abdomin: soft, non-tender, without masses or organomegaly no shifting dullness or ascites. EXT:no erythema, induration, or nodules   Lab Results: Lab Results  Component Value Date   WBC 5.5 06/30/2015   HGB 11.2* 06/30/2015   HCT 34.7* 06/30/2015   MCV 84.8 06/30/2015   PLT 255 06/30/2015     Chemistry      Component Value Date/Time   NA 141 02/25/2014 1042   NA 137 05/25/2009 1736   K 3.6 02/25/2014 1042   K 3.5 05/25/2009 1736   CL 104 05/25/2009 1736   CO2 20* 02/25/2014 1042   CO2 26 05/25/2009 1736   BUN 10.3 02/25/2014 1042   BUN 5* 05/25/2009 1736   CREATININE 0.8 02/25/2014 1042   CREATININE 0.68 05/25/2009 1736      Component Value Date/Time   CALCIUM 8.3* 02/25/2014 1042   CALCIUM 8.9 05/25/2009 1736   ALKPHOS 64 02/25/2014 1042   AST 16 02/25/2014 1042   ALT 14 02/25/2014 1042   BILITOT 0.45 02/25/2014 1042        Results for Peggy Kelly, Peggy Kelly (MRN [id number]) as of 08/12/2015 15:21  Ref. Range  06/30/2015 15:20  Iron Latest Ref Range: 41-142 ug/dL 53  UIBC Latest Ref Range: 120-384 ug/dL 782  TIBC Latest Ref Range: 236-444 ug/dL 956  %SAT Latest Ref Range: 21-57 % 19 (L)  Ferritin Latest Ref Range: 9-269 ng/ml 21    Impression and Plan:  25 year old woman with the following issues:  1. Iron deficiency anemia: Etiology related to  heavy menstrual bleeding that have required IV iron on multiple occasions dating back to 2010. She have a relocated to this area and received IV iron in 2016.   Her iron studies on 03/30/2015 were reviewed today and showed decrease in her iron saturations. Her ferritin level is declining down to 21. She is mildly anemic at 11.2 but not terribly symptomatic. The plan is to continue with observation and surveillance and treat her with intravenous iron in the near future. I anticipate continued decline in her iron studies and likely she will require intravenous iron in September 2017. She refers to have that done preemptively and we will schedule that anticipating her developing worsening iron deficiency anemia in the next few months.  2. Menorrhagia: She follows up with gynecology regarding this issue. Continues to be the cause of her iron deficiency. Seems to be manageable at this time.  3. Follow-up: Will be in 3 months to recheck your iron stores.    Eli Hose, MD 7/19/20174:00 PM

## 2015-09-15 ENCOUNTER — Telehealth: Payer: Self-pay | Admitting: *Deleted

## 2015-09-15 ENCOUNTER — Other Ambulatory Visit: Payer: Self-pay | Admitting: Oncology

## 2015-09-15 NOTE — Telephone Encounter (Signed)
Patient called and moved her appts from last September to earlier. Patient is changing jobs and insurance. She wanted to move up appts and receive iron before her insurance ran out.

## 2015-09-22 ENCOUNTER — Encounter: Payer: Self-pay | Admitting: *Deleted

## 2015-09-23 ENCOUNTER — Ambulatory Visit (INDEPENDENT_AMBULATORY_CARE_PROVIDER_SITE_OTHER): Payer: 59 | Admitting: Diagnostic Neuroimaging

## 2015-09-23 ENCOUNTER — Encounter: Payer: Self-pay | Admitting: Diagnostic Neuroimaging

## 2015-09-23 ENCOUNTER — Other Ambulatory Visit (HOSPITAL_BASED_OUTPATIENT_CLINIC_OR_DEPARTMENT_OTHER): Payer: 59

## 2015-09-23 ENCOUNTER — Ambulatory Visit (HOSPITAL_BASED_OUTPATIENT_CLINIC_OR_DEPARTMENT_OTHER): Payer: 59

## 2015-09-23 VITALS — BP 116/49 | HR 79 | Temp 97.9°F | Resp 16

## 2015-09-23 VITALS — BP 121/68 | HR 75 | Ht 65.0 in | Wt 175.8 lb

## 2015-09-23 DIAGNOSIS — D509 Iron deficiency anemia, unspecified: Secondary | ICD-10-CM

## 2015-09-23 DIAGNOSIS — G43009 Migraine without aura, not intractable, without status migrainosus: Secondary | ICD-10-CM

## 2015-09-23 LAB — CBC WITH DIFFERENTIAL/PLATELET
BASO%: 1.1 % (ref 0.0–2.0)
Basophils Absolute: 0.1 10*3/uL (ref 0.0–0.1)
EOS ABS: 0.1 10*3/uL (ref 0.0–0.5)
EOS%: 1.9 % (ref 0.0–7.0)
HCT: 35.2 % (ref 34.8–46.6)
HGB: 11.2 g/dL — ABNORMAL LOW (ref 11.6–15.9)
LYMPH%: 40.8 % (ref 14.0–49.7)
MCH: 26.1 pg (ref 25.1–34.0)
MCHC: 31.9 g/dL (ref 31.5–36.0)
MCV: 81.8 fL (ref 79.5–101.0)
MONO#: 0.5 10*3/uL (ref 0.1–0.9)
MONO%: 7.7 % (ref 0.0–14.0)
NEUT%: 48.5 % (ref 38.4–76.8)
NEUTROS ABS: 3.3 10*3/uL (ref 1.5–6.5)
PLATELETS: 293 10*3/uL (ref 145–400)
RBC: 4.3 10*6/uL (ref 3.70–5.45)
RDW: 14.8 % — ABNORMAL HIGH (ref 11.2–14.5)
WBC: 6.8 10*3/uL (ref 3.9–10.3)
lymph#: 2.8 10*3/uL (ref 0.9–3.3)

## 2015-09-23 LAB — IRON AND TIBC
%SAT: 12 % — ABNORMAL LOW (ref 21–57)
Iron: 39 ug/dL — ABNORMAL LOW (ref 41–142)
TIBC: 315 ug/dL (ref 236–444)
UIBC: 276 ug/dL (ref 120–384)

## 2015-09-23 LAB — FERRITIN: FERRITIN: 21 ng/mL (ref 9–269)

## 2015-09-23 MED ORDER — DIPHENHYDRAMINE HCL 25 MG PO CAPS
ORAL_CAPSULE | ORAL | Status: AC
  Filled 2015-09-23: qty 1

## 2015-09-23 MED ORDER — HEPARIN SOD (PORK) LOCK FLUSH 100 UNIT/ML IV SOLN
500.0000 [IU] | Freq: Once | INTRAVENOUS | Status: DC | PRN
  Filled 2015-09-23: qty 5

## 2015-09-23 MED ORDER — ALTEPLASE 2 MG IJ SOLR
2.0000 mg | Freq: Once | INTRAMUSCULAR | Status: DC | PRN
  Filled 2015-09-23: qty 2

## 2015-09-23 MED ORDER — HEPARIN SOD (PORK) LOCK FLUSH 100 UNIT/ML IV SOLN
250.0000 [IU] | Freq: Once | INTRAVENOUS | Status: DC | PRN
  Filled 2015-09-23: qty 5

## 2015-09-23 MED ORDER — SODIUM CHLORIDE 0.9 % IV SOLN
Freq: Once | INTRAVENOUS | Status: AC
  Administered 2015-09-23: 15:00:00 via INTRAVENOUS

## 2015-09-23 MED ORDER — RIZATRIPTAN BENZOATE 10 MG PO TBDP: 10.0000 mg | ORAL_TABLET | ORAL | 6 refills | Status: DC | PRN

## 2015-09-23 MED ORDER — SODIUM CHLORIDE 0.9 % IV SOLN
510.0000 mg | Freq: Once | INTRAVENOUS | Status: AC
  Administered 2015-09-23: 510 mg via INTRAVENOUS
  Filled 2015-09-23: qty 17

## 2015-09-23 MED ORDER — DIPHENHYDRAMINE HCL 25 MG PO CAPS
25.0000 mg | ORAL_CAPSULE | Freq: Once | ORAL | Status: AC
  Administered 2015-09-23: 25 mg via ORAL

## 2015-09-23 MED ORDER — SODIUM CHLORIDE 0.9 % IJ SOLN
10.0000 mL | INTRAMUSCULAR | Status: DC | PRN
  Filled 2015-09-23: qty 10

## 2015-09-23 MED ORDER — SODIUM CHLORIDE 0.9 % IJ SOLN
3.0000 mL | Freq: Once | INTRAMUSCULAR | Status: DC | PRN
  Filled 2015-09-23: qty 10

## 2015-09-23 NOTE — Progress Notes (Signed)
[facility]  PATIENT: Peggy Kelly DOB: [birth year]  REFERRING CLINICIAN: Sonny Dandy HISTORY FROM: patient  REASON FOR VISIT: new consult    HISTORICAL  CHIEF COMPLAINT:  Chief Complaint  Patient presents with  . Migraine    rm 7, New Pt, husband -Jayvon, " migraines x 2-3 yrs, Excedrin Migraine helps if i catch it right at the beginning"    HISTORY OF PRESENT ILLNESS:   25 year old female here for evaluation of migraine headaches. Patient reports 3 year history of intermittent headaches with bitemporal, throbbing, severe pain lasting hours to time. She may have 2-3 of these headaches per week. She may have 2-3 days per month where she misses work. She reports photophobia, phonophobia and nausea. No vomiting. Triggering factors include menstrual cycle and skipping meals. Patient previously tried topiramate, baclofen and Maxalt which seemed to help. Now patient and husband are trying to conceive and therefore patient is only using Excedrin Migraine for headache relief. Patient previously tried trigger point injections with mild relief as well. Patient has family history of migraine in her sister.     REVIEW OF SYSTEMS: Full 14 system review of systems performed and negative with exception of: Allergy headache anemia.  ALLERGIES: Allergies  Allergen Reactions  . Penicillins Hives    Patient doesn't remember what type of reaction.    HOME MEDICATIONS: Outpatient Medications Prior to Visit  Medication Sig Dispense Refill  . Prenatal Vit-Fe Fumarate-FA (PRENATAL VITAMIN PO) Take 1 capsule by mouth daily.     No facility-administered medications prior to visit.     PAST MEDICAL HISTORY: Past Medical History:  Diagnosis Date  . Migraines   . Seasonal allergies     PAST SURGICAL HISTORY: Past Surgical History:  Procedure Laterality Date  . APPENDECTOMY    . COLPOSCOPY N/A 05/09/2013   Procedure: COLPOSCOPY WITH ECC;  Surgeon: Marylynn Pearson, MD;   Location: [facility];  Service: Gynecology;  Laterality: N/A;  . DILATATION & CURETTAGE/HYSTEROSCOPY WITH TRUECLEAR N/A 05/09/2013   Procedure: DILATATION & CURETTAGE/HYSTEROSCOPY WITH TRUCLEAR;  Surgeon: Marylynn Pearson, MD;  Location: [facility];  Service: Gynecology;  Laterality: N/A;    FAMILY HISTORY: Family History  Problem Relation Age of Onset  . Migraines Sister   . Cancer Maternal Grandfather     prostate    SOCIAL HISTORY:  Social History   Social History  . Marital status: Married    Spouse name: Jayvon  . Number of children: 0  . Years of education: 53   Occupational History  .      [employer] specialist   Social History Main Topics  . Smoking status: Never Smoker  . Smokeless tobacco: Never Used  . Alcohol use Yes     Comment: occassional, 09/23/15 none now  . Drug use: No  . Sexual activity: Not on file   Other Topics Concern  . Not on file   Social History Narrative   Lives at home with husband   Caffeine - 2 sodas a week     PHYSICAL EXAM  GENERAL EXAM/CONSTITUTIONAL: Vitals:  Vitals:   09/23/15 1139  BP: 121/68  Pulse: 75  Weight: 175 lb 12.8 oz (79.7 kg)  Height: '5\' 5"'$  (1.651 m)     Body mass index is 29.25 kg/m.  Visual Acuity Screening   Right eye Left eye Both eyes  Without correction: 20/30 20/30   With correction:        Patient is in no distress; well developed,  nourished and groomed; neck is supple  CARDIOVASCULAR:  Examination of carotid arteries is normal; no carotid bruits  Regular rate and rhythm, no murmurs  Examination of peripheral vascular system by observation and palpation is normal  EYES:  Ophthalmoscopic exam of optic discs and posterior segments is normal; no papilledema or hemorrhages  MUSCULOSKELETAL:  Gait, strength, tone, movements noted in Neurologic exam below  NEUROLOGIC: MENTAL STATUS:  No flowsheet data found.  awake, alert, oriented to person, place and time  recent and remote  memory intact  normal attention and concentration  language fluent, comprehension intact, naming intact,   fund of knowledge appropriate  CRANIAL NERVE:   2nd - no papilledema on fundoscopic exam  2nd, 3rd, 4th, 6th - pupils equal and reactive to light, visual fields full to confrontation, extraocular muscles intact, no nystagmus  5th - facial sensation symmetric  7th - facial strength symmetric  8th - hearing intact  9th - palate elevates symmetrically, uvula midline  11th - shoulder shrug symmetric  12th - tongue protrusion midline  MOTOR:   normal bulk and tone, full strength in the BUE, BLE  SENSORY:   normal and symmetric to light touch, temperature, vibration  COORDINATION:   finger-nose-finger, fine finger movements normal  REFLEXES:   deep tendon reflexes present and symmetric  GAIT/STATION:   narrow based gait; able to walk tandem    DIAGNOSTIC DATA (LABS, IMAGING, TESTING) - I reviewed patient records, labs, notes, testing and imaging myself where available.  Lab Results  Component Value Date   WBC 5.5 06/30/2015   HGB 11.2 (L) 06/30/2015   HCT 34.7 (L) 06/30/2015   MCV 84.8 06/30/2015   PLT 255 06/30/2015      Component Value Date/Time   NA 141 02/25/2014 1042   K 3.6 02/25/2014 1042   CL 104 05/25/2009 1736   CO2 20 (L) 02/25/2014 1042   GLUCOSE 79 02/25/2014 1042   BUN 10.3 02/25/2014 1042   CREATININE 0.8 02/25/2014 1042   CALCIUM 8.3 (L) 02/25/2014 1042   PROT 6.8 02/25/2014 1042   ALBUMIN 3.7 02/25/2014 1042   AST 16 02/25/2014 1042   ALT 14 02/25/2014 1042   ALKPHOS 64 02/25/2014 1042   BILITOT 0.45 02/25/2014 1042   GFRNONAA >60 05/25/2009 1736   GFRAA  05/25/2009 1736    >60        The eGFR has been calculated using the MDRD equation. This calculation has not been validated in all clinical situations. eGFR's persistently <60 mL/min signify possible Chronic Kidney Disease.   No results found for: CHOL, HDL,  LDLCALC, LDLDIRECT, TRIG, CHOLHDL No results found for: HGBA1C No results found for: VITAMINB12 No results found for: TSH     ASSESSMENT AND PLAN  25 y.o. year old female here with Migraine without aura since 2014. Neurologic examination unremarkable. Patient and husband are trying to conceive and therefore this limits medication treatment options.    Dx:  1. Migraine without aura and without status migrainosus, not intractable      PLAN: - use rizatriptan as needed until conception; then use tylenol as needed for migraine rescue - hold off on migraine prevention for now; try to optimize nutrition, exercise, sleep and stress mgmt - pregnancy planning and medication safety issues reviewed  Meds ordered this encounter  Medications  . rizatriptan (MAXALT-MLT) 10 MG disintegrating tablet    Sig: Take 1 tablet (10 mg total) by mouth as needed for migraine. May repeat in 2 hours if needed  Dispense:  9 tablet    Refill:  6   Return in about 3 months (around 12/24/2015).    Penni Bombard, MD 6/38/9373, 42:87 PM Certified in Neurology, Neurophysiology and Neuroimaging  [facility] [address], [place], [place] [address] [phone]

## 2015-09-23 NOTE — Patient Instructions (Signed)

## 2015-09-23 NOTE — Patient Instructions (Signed)
Thank you for coming to see [place] at [facility]. I hope we have been able to provide you high quality care today.  You may receive a patient satisfaction survey over the next few weeks. We would appreciate your feedback and comments so that we may continue to improve ourselves and the health of our patients.  - rizatriptan '10mg'$  as needed for breakthrough headache; may repeat x 1 after 2 hours; max 2 tabs per day or 8 per month  - To prevent or relieve headaches, try the following:   Cool Compress. Lie down and place a cool compress on your head.   Avoid headache triggers. If certain foods or odors seem to have triggered your migraines in the past, avoid them. A headache diary might help you identify triggers.   Include physical activity in your daily routine.   Manage stress. Find healthy ways to cope with the stressors, such as delegating tasks on your to-do list.   Practice relaxation techniques. Try deep breathing, yoga, massage and visualization.   Eat regularly. Eating regularly scheduled meals and maintaining a healthy diet might help prevent headaches. Also, drink plenty of fluids.   Follow a regular sleep schedule. Sleep deprivation might contribute to headaches  Consider biofeedback. With this mind-body technique, you learn to control certain bodily functions - such as muscle tension, heart rate and blood pressure - to prevent headaches or reduce headache pain.    ~~~~~~~~~~~~~~~~~~~~~~~~~~~~~~~~~~~~~~~~~~~~~~~~~~~~~~~~~~~~~~~~~  DR. Belisa Eichholz'S GUIDE TO HAPPY AND HEALTHY LIVING These are some of my general health and wellness recommendations. Some of them may apply to you better than others. Please use common sense as you try these suggestions and feel free to ask me any questions.   ACTIVITY/FITNESS Mental, social, emotional and physical stimulation are very important for brain and body health. Try learning a new activity (arts, music, language,  sports, games).  Keep moving your body to the best of your abilities. You can do this at home, inside or outside, the park, community center, gym or anywhere you like. Consider a physical therapist or personal trainer to get started. Consider the app Sworkit. Fitness trackers such as smart-watches, smart-phones or Fitbits can help as well.   NUTRITION Eat more plants: colorful vegetables, nuts, seeds and berries.  Eat less sugar, salt, preservatives and processed foods.  Avoid toxins such as cigarettes and alcohol.  Drink water when you are thirsty. Warm water with a slice of lemon is an excellent morning drink to start the day.  Consider these websites for more information The Nutrition Source (https://www.henry-hernandez.biz/) [employer] Nutrition (WindowBlog.ch)   RELAXATION Consider practicing mindfulness meditation or other relaxation techniques such as deep breathing, prayer, yoga, tai chi, massage. See website mindful.org or the apps Headspace or Calm to help get started.   SLEEP Try to get at least 7-8+ hours sleep per day. Regular exercise and reduced caffeine will help you sleep better. Practice good sleep hygeine techniques. See website sleep.org for more information.   PLANNING Prepare estate planning, living will, healthcare POA documents. Sometimes this is best planned with the help of an attorney. Theconversationproject.org and agingwithdignity.org are excellent resources.

## 2015-09-30 ENCOUNTER — Ambulatory Visit (HOSPITAL_BASED_OUTPATIENT_CLINIC_OR_DEPARTMENT_OTHER): Payer: 59

## 2015-09-30 ENCOUNTER — Other Ambulatory Visit: Payer: Self-pay | Admitting: *Deleted

## 2015-09-30 ENCOUNTER — Other Ambulatory Visit: Payer: 59

## 2015-09-30 VITALS — BP 112/65 | HR 74 | Temp 98.2°F | Resp 18

## 2015-09-30 DIAGNOSIS — D509 Iron deficiency anemia, unspecified: Secondary | ICD-10-CM

## 2015-09-30 MED ORDER — SODIUM CHLORIDE 0.9 % IV SOLN
Freq: Once | INTRAVENOUS | Status: AC
Start: 2015-09-30 — End: 2015-09-30
  Administered 2015-09-30: 15:00:00 via INTRAVENOUS

## 2015-09-30 MED ORDER — DIPHENHYDRAMINE HCL 25 MG PO CAPS
25.0000 mg | ORAL_CAPSULE | Freq: Once | ORAL | Status: AC
  Administered 2015-09-30: 25 mg via ORAL

## 2015-09-30 MED ORDER — DIPHENHYDRAMINE HCL 25 MG PO CAPS
ORAL_CAPSULE | ORAL | Status: AC
  Filled 2015-09-30: qty 1

## 2015-09-30 MED ORDER — SODIUM CHLORIDE 0.9 % IV SOLN
510.0000 mg | Freq: Once | INTRAVENOUS | Status: AC
  Administered 2015-09-30: 510 mg via INTRAVENOUS
  Filled 2015-09-30: qty 17

## 2015-09-30 NOTE — Patient Instructions (Signed)

## 2015-10-13 ENCOUNTER — Other Ambulatory Visit: Payer: 59

## 2015-10-13 ENCOUNTER — Ambulatory Visit: Payer: 59

## 2015-10-20 ENCOUNTER — Other Ambulatory Visit: Payer: 59

## 2015-10-20 ENCOUNTER — Ambulatory Visit: Payer: 59

## 2015-10-30 ENCOUNTER — Encounter (HOSPITAL_COMMUNITY): Payer: Self-pay | Admitting: Vascular Surgery

## 2015-10-30 ENCOUNTER — Emergency Department (HOSPITAL_COMMUNITY)
Admission: EM | Admit: 2015-10-30 | Discharge: 2015-10-31 | Disposition: A | Discharge status: Home / Self Care | Payer: Self-pay | Attending: Emergency Medicine | Admitting: Emergency Medicine

## 2015-10-30 DIAGNOSIS — R102 Pelvic and perineal pain: Secondary | ICD-10-CM | POA: Insufficient documentation

## 2015-10-30 DIAGNOSIS — N200 Calculus of kidney: Secondary | ICD-10-CM

## 2015-10-30 DIAGNOSIS — Z7982 Long term (current) use of aspirin: Secondary | ICD-10-CM | POA: Insufficient documentation

## 2015-10-30 DIAGNOSIS — N132 Hydronephrosis with renal and ureteral calculous obstruction: Secondary | ICD-10-CM | POA: Insufficient documentation

## 2015-10-30 DIAGNOSIS — N12 Tubulo-interstitial nephritis, not specified as acute or chronic: Secondary | ICD-10-CM | POA: Insufficient documentation

## 2015-10-30 LAB — CBC
HEMATOCRIT: 37.9 % (ref 36.0–46.0)
Hemoglobin: 11.9 g/dL — ABNORMAL LOW (ref 12.0–15.0)
MCH: 26.7 pg (ref 26.0–34.0)
MCHC: 31.4 g/dL (ref 30.0–36.0)
MCV: 85.2 fL (ref 78.0–100.0)
PLATELETS: 277 10*3/uL (ref 150–400)
RBC: 4.45 MIL/uL (ref 3.87–5.11)
RDW: 14.4 % (ref 11.5–15.5)
WBC: 9.7 10*3/uL (ref 4.0–10.5)

## 2015-10-30 LAB — COMPREHENSIVE METABOLIC PANEL
ALBUMIN: 4 g/dL (ref 3.5–5.0)
ALT: 23 U/L (ref 14–54)
AST: 22 U/L (ref 15–41)
Alkaline Phosphatase: 49 U/L (ref 38–126)
Anion gap: 9 (ref 5–15)
BILIRUBIN TOTAL: 0.2 mg/dL — AB (ref 0.3–1.2)
BUN: 14 mg/dL (ref 6–20)
CO2: 25 mmol/L (ref 22–32)
CREATININE: 0.75 mg/dL (ref 0.44–1.00)
Calcium: 9.1 mg/dL (ref 8.9–10.3)
Chloride: 104 mmol/L (ref 101–111)
GFR calc Af Amer: >60 mL/min (ref >60)
GLUCOSE: 100 mg/dL — AB (ref 65–99)
POTASSIUM: 3.4 mmol/L — AB (ref 3.5–5.1)
Sodium: 138 mmol/L (ref 135–145)
TOTAL PROTEIN: 7.1 g/dL (ref 6.5–8.1)

## 2015-10-30 LAB — URINALYSIS, ROUTINE W REFLEX MICROSCOPIC
Bilirubin Urine: NEGATIVE
Glucose, UA: NEGATIVE mg/dL
Ketones, ur: NEGATIVE mg/dL
LEUKOCYTES UA: NEGATIVE
NITRITE: NEGATIVE
Protein, ur: 30 mg/dL — AB
pH: 5.5 (ref 5.0–8.0)

## 2015-10-30 LAB — URINE MICROSCOPIC-ADD ON

## 2015-10-30 LAB — HCG, QUANTITATIVE, PREGNANCY

## 2015-10-30 LAB — LIPASE, BLOOD: Lipase: 29 U/L (ref 11–51)

## 2015-10-30 MED ORDER — OXYCODONE-ACETAMINOPHEN 5-325 MG PO TABS
ORAL_TABLET | ORAL | Status: AC
  Administered 2015-10-30: 1
  Filled 2015-10-30: qty 1

## 2015-10-30 MED ORDER — OXYCODONE-ACETAMINOPHEN 5-325 MG PO TABS: 1.0000 | ORAL_TABLET | ORAL | Status: DC | PRN

## 2015-10-30 MED ORDER — OXYCODONE-ACETAMINOPHEN 5-325 MG PO TABS
1.0000 | ORAL_TABLET | Freq: Once | ORAL | Status: AC
  Administered 2015-10-30: 1 via ORAL
  Filled 2015-10-30: qty 1

## 2015-10-30 MED ORDER — ONDANSETRON 4 MG PO TBDP
4.0000 mg | ORAL_TABLET | Freq: Once | ORAL | Status: AC | PRN
  Administered 2015-10-30: 4 mg via ORAL

## 2015-10-30 MED ORDER — ONDANSETRON 4 MG PO TBDP
ORAL_TABLET | ORAL | Status: AC
  Filled 2015-10-30: qty 1

## 2015-10-30 NOTE — ED Triage Notes (Signed)
Pt reports to the ED for eval of RLQ abd pain and pain in her lower abd/pelvic area. Pt reports some associated N/V. Denies any diarrhea. Pt denies any vaginal bleeding or d/c. Pain began suddenly around 141900

## 2015-10-31 ENCOUNTER — Emergency Department (HOSPITAL_COMMUNITY): Payer: Self-pay

## 2015-10-31 LAB — WET PREP, GENITAL
CLUE CELLS WET PREP: NONE SEEN
Sperm: NONE SEEN
Trich, Wet Prep: NONE SEEN
YEAST WET PREP: NONE SEEN

## 2015-10-31 MED ORDER — LEVOFLOXACIN 500 MG PO TABS: 500.0000 mg | ORAL_TABLET | Freq: Every day | ORAL | 0 refills | Status: DC

## 2015-10-31 MED ORDER — IOPAMIDOL (ISOVUE-300) INJECTION 61%
INTRAVENOUS | Status: AC
  Administered 2015-10-31: 100 mL
  Filled 2015-10-31: qty 100

## 2015-10-31 MED ORDER — ONDANSETRON HCL 4 MG/2ML IJ SOLN
4.0000 mg | Freq: Once | INTRAMUSCULAR | Status: AC
  Administered 2015-10-31: 4 mg via INTRAVENOUS
  Filled 2015-10-31: qty 2

## 2015-10-31 MED ORDER — LEVOFLOXACIN IN D5W 750 MG/150ML IV SOLN
750.0000 mg | INTRAVENOUS | Status: DC
  Administered 2015-10-31: 750 mg via INTRAVENOUS
  Filled 2015-10-31: qty 150

## 2015-10-31 MED ORDER — HYDROMORPHONE HCL 1 MG/ML IJ SOLN
1.0000 mg | Freq: Once | INTRAMUSCULAR | Status: AC
  Administered 2015-10-31: 1 mg via INTRAVENOUS
  Filled 2015-10-31: qty 1

## 2015-10-31 MED ORDER — HYDROCODONE-ACETAMINOPHEN 5-325 MG PO TABS: 1.0000 | ORAL_TABLET | Freq: Four times a day (QID) | ORAL | 0 refills | Status: DC | PRN

## 2015-10-31 MED ORDER — KETOROLAC TROMETHAMINE 30 MG/ML IJ SOLN
30.0000 mg | Freq: Once | INTRAMUSCULAR | Status: AC
  Administered 2015-10-31: 30 mg via INTRAVENOUS
  Filled 2015-10-31: qty 1

## 2015-10-31 MED ORDER — SODIUM CHLORIDE 0.9 % IV SOLN
Freq: Once | INTRAVENOUS | Status: AC
  Administered 2015-10-31: 04:00:00 via INTRAVENOUS

## 2015-10-31 MED ORDER — ONDANSETRON 4 MG PREPACK (~~LOC~~): 1.0000 | ORAL_TABLET | Freq: Three times a day (TID) | ORAL | 0 refills | Status: DC | PRN

## 2015-10-31 NOTE — Discharge Instructions (Signed)
Her CT scan shows that you have a 4 mm stone on the right with what may be the beginning of pyelonephritis and treated with IV antibiotics in the emergency department, as well as pain medication, you been given prescriptions for hydrocodone for pain control and Levaquin, which is continuing the antibiotic, you've also been given a prescription for Zofran to help control episodes of nausea and vomiting if needed. I also recommend that you follow-up with urology.  Please call and make an appointment

## 2015-10-31 NOTE — ED Notes (Addendum)
Medicated per order for c/o pain.  Friends at the bedside.  Encouraged to call for assistance as needed.

## 2015-10-31 NOTE — ED Provider Notes (Signed)
[facility]-EMERGENCY DEPT Provider Note   CSN: [id number] Arrival date & time: 10/30/15  2000     History   Chief Complaint Chief Complaint  Patient presents with  . Abdominal Pain  . Emesis    HPI Peggy Kelly is a 25 y.o. female.  This normally healthy 25 year old female.  He started with acute right sided pain, flank and abdomen.  This evening.  She states she had a normal menstrual cycle 2 weeks ago.  She's had some spotting since that time.  Denies any vaginal discharge.  She is sexually active with her husband only.      Past Medical History:  Diagnosis Date  . Migraines   . Seasonal allergies     Patient Active Problem List   Diagnosis Date Noted  . Iron deficiency anemia 02/25/2014    Past Surgical History:  Procedure Laterality Date  . APPENDECTOMY    . COLPOSCOPY N/A 05/09/2013   Procedure: COLPOSCOPY WITH ECC;  Surgeon: Zelphia Cairo, MD;  Location: [facility];  Service: Gynecology;  Laterality: N/A;  . DILATATION & CURETTAGE/HYSTEROSCOPY WITH TRUECLEAR N/A 05/09/2013   Procedure: DILATATION & CURETTAGE/HYSTEROSCOPY WITH TRUCLEAR;  Surgeon: Zelphia Cairo, MD;  Location: [facility];  Service: Gynecology;  Laterality: N/A;    OB History    No data available       Home Medications    Prior to Admission medications   Medication Sig Start Date End Date Taking? Authorizing Provider  aspirin-acetaminophen-caffeine (EXCEDRIN MIGRAINE) [phone] MG tablet Take by mouth every 6 (six) hours as needed for headache.    Historical Provider, MD  HYDROcodone-acetaminophen (NORCO/VICODIN) 5-325 MG tablet Take 1 tablet by mouth every 6 (six) hours as needed for severe pain. 10/31/15   Earley Favor, NP  ibuprofen (ADVIL,MOTRIN) 200 MG tablet Take 200 mg by mouth every 6 (six) hours as needed.    Historical Provider, MD  levofloxacin (LEVAQUIN) 500 MG tablet Take 1 tablet (500 mg total) by mouth daily. 10/31/15   Earley Favor, NP  ondansetron (ZOFRAN) 4 mg TABS tablet Take 4  tablets by mouth every 8 (eight) hours as needed. 10/31/15   Earley Favor, NP  Prenatal Vit-Fe Fumarate-FA (PRENATAL VITAMIN PO) Take 1 capsule by mouth daily.    Historical Provider, MD  rizatriptan (MAXALT-MLT) 10 MG disintegrating tablet Take 1 tablet (10 mg total) by mouth as needed for migraine. May repeat in 2 hours if needed 09/23/15   Suanne Marker, MD    Family History Family History  Problem Relation Age of Onset  . Migraines Sister   . Cancer Maternal Grandfather     prostate    Social History Social History  Substance Use Topics  . Smoking status: Never Smoker  . Smokeless tobacco: Never Used  . Alcohol use Yes     Comment: occassional, 09/23/15 none now     Allergies   Penicillins   Review of Systems Review of Systems  Gastrointestinal: Positive for nausea. Negative for constipation and diarrhea.  Genitourinary: Positive for flank pain, hematuria and vaginal bleeding. Negative for dysuria and menstrual problem.  All other systems reviewed and are negative.    Physical Exam Updated Vital Signs BP 127/76   Pulse 94   Temp 98.2 F (36.8 C) (Oral)   Resp 18   SpO2 100%   Physical Exam  Constitutional: She appears well-developed and well-nourished.  HENT:  Head: Normocephalic.  Eyes: Pupils are equal, round, and reactive to light.  Neck: Normal range of motion.  Cardiovascular: Normal rate.   Pulmonary/Chest: Effort normal.  Abdominal: Soft. Bowel sounds are normal. She exhibits no distension. There is no tenderness.  Genitourinary: Vagina normal. Cervix exhibits no discharge. Right adnexum displays no fullness. Left adnexum displays no tenderness and no fullness. No vaginal discharge found.  Musculoskeletal: Normal range of motion.  Neurological: She is alert.  Skin: Skin is warm and dry.  Psychiatric: She has a normal mood and affect.  Nursing note and vitals reviewed.    ED Treatments / Results  Labs (all labs ordered are listed, but only  abnormal results are displayed) Labs Reviewed  WET PREP, GENITAL - Abnormal; Notable for the following:       Result Value   WBC, Wet Prep HPF POC FEW (*)    All other components within normal limits  COMPREHENSIVE METABOLIC PANEL - Abnormal; Notable for the following:    Potassium 3.4 (*)    Glucose, Bld 100 (*)    Total Bilirubin 0.2 (*)    All other components within normal limits  CBC - Abnormal; Notable for the following:    Hemoglobin 11.9 (*)    All other components within normal limits  URINALYSIS, ROUTINE W REFLEX MICROSCOPIC (NOT AT [facility]) - Abnormal; Notable for the following:    APPearance HAZY (*)    Specific Gravity, Urine >1.030 (*)    Hgb urine dipstick LARGE (*)    Protein, ur 30 (*)    All other components within normal limits  URINE MICROSCOPIC-ADD ON - Abnormal; Notable for the following:    Squamous Epithelial / LPF 0-5 (*)    Bacteria, UA FEW (*)    All other components within normal limits  LIPASE, BLOOD  HCG, QUANTITATIVE, PREGNANCY    EKG  EKG Interpretation None       Radiology Ct Abdomen Pelvis W Contrast  Result Date: 10/31/2015 CLINICAL DATA:  Acute onset of generalized abdominal pain, nausea and vomiting. Initial encounter. EXAM: CT ABDOMEN AND PELVIS WITH CONTRAST TECHNIQUE: Multidetector CT imaging of the abdomen and pelvis was performed using the standard protocol following bolus administration of intravenous contrast. CONTRAST:  ISOVUE-300 IOPAMIDOL (ISOVUE-300) INJECTION 61% COMPARISON:  CT of the abdomen and pelvis from 05/25/2009 FINDINGS: Lower chest: The visualized lung bases are grossly clear. The visualized portions of the mediastinum are unremarkable. Hepatobiliary: The liver is unremarkable in appearance. The gallbladder is unremarkable in appearance. The common bile duct remains normal in caliber. Pancreas: The pancreas is within normal limits. Spleen: The spleen is unremarkable in appearance. Adrenals/Urinary Tract: The adrenal  glands are unremarkable in appearance. There is mild right-sided hydronephrosis, with an obstructing 4 mm stone noted in the distal right ureter, just above the right vesicoureteral junction. There is slightly decreased right renal enhancement. No perinephric stranding is seen. No nonobstructing renal stones are identified. The left kidney is unremarkable in appearance. Stomach/Bowel: The stomach is unremarkable in appearance. The small bowel is within normal limits. The patient is status post appendectomy. The colon is unremarkable in appearance. Vascular/Lymphatic: The abdominal aorta is unremarkable in appearance. The inferior vena cava is grossly unremarkable. No retroperitoneal lymphadenopathy is seen. No pelvic sidewall lymphadenopathy is identified. Reproductive: The bladder is mildly distended and within normal limits. The uterus is grossly unremarkable in appearance. The ovaries are relatively symmetric. No suspicious adnexal masses are seen. Other: No additional soft tissue abnormalities are seen. Musculoskeletal: No acute osseous abnormalities are identified. The visualized musculature is unremarkable in appearance. IMPRESSION: 1. Mild right-sided hydronephrosis, with  obstructing 4 mm stone at the distal right ureter, just above the right vesicoureteral junction. 2. Slightly decreased right renal enhancement. Would correlate for any evidence of pyelonephritis. Electronically Signed   By: Roanna RaiderJeffery  Chang M.D.   On: 10/31/2015 02:50    Procedures Procedures (including critical care time)  Medications Ordered in ED Medications  oxyCODONE-acetaminophen (PERCOCET/ROXICET) 5-325 MG per tablet 1 tablet (not administered)  ondansetron (ZOFRAN) injection 4 mg (4 mg Intravenous Not Given 10/31/15 0352)  levofloxacin (LEVAQUIN) IVPB 750 mg (750 mg Intravenous New Bag/Given 10/31/15 0352)  ondansetron (ZOFRAN-ODT) disintegrating tablet 4 mg (4 mg Oral Given 10/30/15 2017)  oxyCODONE-acetaminophen  (PERCOCET/ROXICET) 5-325 MG per tablet (1 tablet  Given 10/30/15 2028)  oxyCODONE-acetaminophen (PERCOCET/ROXICET) 5-325 MG per tablet 1 tablet (1 tablet Oral Given 10/30/15 2353)  0.9 %  sodium chloride infusion ( Intravenous New Bag/Given 10/31/15 0351)  HYDROmorphone (DILAUDID) injection 1 mg (1 mg Intravenous Given 10/31/15 0100)  iopamidol (ISOVUE-300) 61 % injection (100 mLs  Contrast Given 10/31/15 0226)  ketorolac (TORADOL) 30 MG/ML injection 30 mg (30 mg Intravenous Given 10/31/15 0352)     Initial Impression / Assessment and Plan / ED Course  I have reviewed the triage vital signs and the nursing notes.  Pertinent labs & imaging results that were available during my care of the patient were reviewed by me and considered in my medical decision making (see chart for details).  Clinical Course     Patient's CT scan shows that she has a 4 mm stone at the UV J with questionable stranding suggestive of early pyelonephritis.  Patient's white count is normal.  She has been started on Levaquin IV.  She will be given a prescription for Levaquin at home.  1 tablet for the next 7 days as well as Vicodin for pain control and Zofran for any episodes of nausea or vomiting, and she's been given a referral to urology for follow-up  Final Clinical Impressions(s) / ED Diagnoses   Final diagnoses:  Kidney stone on right side  Pyelonephritis    New Prescriptions New Prescriptions   HYDROCODONE-ACETAMINOPHEN (NORCO/VICODIN) 5-325 MG TABLET    Take 1 tablet by mouth every 6 (six) hours as needed for severe pain.   LEVOFLOXACIN (LEVAQUIN) 500 MG TABLET    Take 1 tablet (500 mg total) by mouth daily.   ONDANSETRON (ZOFRAN) 4 MG TABS TABLET    Take 4 tablets by mouth every 8 (eight) hours as needed.     Earley FavorGail Enyah Moman, NP 10/31/15 [id number]    Arby BarretteMarcy Pfeiffer, MD 10/31/15 [id number]

## 2015-12-28 ENCOUNTER — Ambulatory Visit: Payer: 59 | Admitting: Diagnostic Neuroimaging

## 2016-02-10 ENCOUNTER — Ambulatory Visit: Payer: 59 | Admitting: Oncology

## 2016-02-10 ENCOUNTER — Other Ambulatory Visit: Payer: 59

## 2016-03-15 ENCOUNTER — Ambulatory Visit: Payer: 59 | Admitting: Diagnostic Neuroimaging

## 2016-03-30 DIAGNOSIS — N978 Female infertility of other origin: Secondary | ICD-10-CM | POA: Diagnosis not present

## 2016-03-30 DIAGNOSIS — Z0000 Encounter for general adult medical examination without abnormal findings: Secondary | ICD-10-CM | POA: Diagnosis not present

## 2016-03-30 DIAGNOSIS — Z3169 Encounter for other general counseling and advice on procreation: Secondary | ICD-10-CM | POA: Diagnosis not present

## 2016-03-30 DIAGNOSIS — Z1159 Encounter for screening for other viral diseases: Secondary | ICD-10-CM | POA: Diagnosis not present

## 2016-04-08 DIAGNOSIS — Z3169 Encounter for other general counseling and advice on procreation: Secondary | ICD-10-CM | POA: Diagnosis not present

## 2016-05-10 DIAGNOSIS — Z01419 Encounter for gynecological examination (general) (routine) without abnormal findings: Secondary | ICD-10-CM | POA: Diagnosis not present

## 2016-05-10 DIAGNOSIS — Z6829 Body mass index (BMI) 29.0-29.9, adult: Secondary | ICD-10-CM | POA: Diagnosis not present

## 2016-05-25 ENCOUNTER — Encounter: Payer: Self-pay | Admitting: Physician Assistant

## 2016-05-25 ENCOUNTER — Ambulatory Visit (INDEPENDENT_AMBULATORY_CARE_PROVIDER_SITE_OTHER): Payer: BLUE CROSS/BLUE SHIELD | Admitting: Physician Assistant

## 2016-05-25 VITALS — BP 125/79 | HR 94 | Temp 98.4°F | Resp 16 | Ht 65.0 in | Wt 172.0 lb

## 2016-05-25 DIAGNOSIS — R51 Headache: Secondary | ICD-10-CM | POA: Diagnosis not present

## 2016-05-25 DIAGNOSIS — J029 Acute pharyngitis, unspecified: Secondary | ICD-10-CM | POA: Diagnosis not present

## 2016-05-25 DIAGNOSIS — R519 Headache, unspecified: Secondary | ICD-10-CM

## 2016-05-25 DIAGNOSIS — R42 Dizziness and giddiness: Secondary | ICD-10-CM

## 2016-05-25 DIAGNOSIS — J309 Allergic rhinitis, unspecified: Secondary | ICD-10-CM

## 2016-05-25 LAB — POCT CBC
Granulocyte percent: 78.5 %G (ref 37–80)
HCT, POC: 36.2 % — AB (ref 37.7–47.9)
HEMOGLOBIN: 12.3 g/dL (ref 12.2–16.2)
Lymph, poc: 1.8 (ref 0.6–3.4)
MCH: 27.6 pg (ref 27–31.2)
MCHC: 34 g/dL (ref 31.8–35.4)
MCV: 81.2 fL (ref 80–97)
MID (cbc): 0.6 (ref 0–0.9)
MPV: 8.1 fL (ref 0–99.8)
PLATELET COUNT, POC: 268 10*3/uL (ref 142–424)
POC Granulocyte: 8.7 — AB (ref 2–6.9)
POC LYMPH PERCENT: 16 %L (ref 10–50)
POC MID %: 5.5 %M (ref 0–12)
RBC: 4.46 M/uL (ref 4.04–5.48)
RDW, POC: 14.3 %
WBC: 11.1 10*3/uL — AB (ref 4.6–10.2)

## 2016-05-25 LAB — POCT URINALYSIS DIP (MANUAL ENTRY)
BILIRUBIN UA: NEGATIVE
GLUCOSE UA: NEGATIVE mg/dL
Ketones, POC UA: NEGATIVE mg/dL
Leukocytes, UA: NEGATIVE
Nitrite, UA: NEGATIVE
Protein Ur, POC: NEGATIVE mg/dL
RBC UA: NEGATIVE
SPEC GRAV UA: 1.02 (ref 1.010–1.025)
Urobilinogen, UA: 0.2 E.U./dL
pH, UA: 7 (ref 5.0–8.0)

## 2016-05-25 LAB — POCT RAPID STREP A (OFFICE): Rapid Strep A Screen: NEGATIVE

## 2016-05-25 LAB — GLUCOSE, POCT (MANUAL RESULT ENTRY): POC Glucose: 97 mg/dl (ref 70–99)

## 2016-05-25 MED ORDER — MAGIC MOUTHWASH W/LIDOCAINE: 10.0000 mL | ORAL | 0 refills | Status: DC | PRN

## 2016-05-25 MED ORDER — AZITHROMYCIN 250 MG PO TABS: ORAL_TABLET | ORAL | 0 refills | Status: DC

## 2016-05-25 MED ORDER — FLUTICASONE PROPIONATE 50 MCG/ACT NA SUSP: 2.0000 | Freq: Every day | NASAL | 0 refills | Status: DC

## 2016-05-25 NOTE — Progress Notes (Signed)
MRN: [id number] DOB: [birth year]  Subjective:   Peggy Kelly is a 26 y.o. female presenting for chief complaint of Sore Throat (started this morning); Headache (started with morning ); and Dizziness .  Reports 2 day history of sinus congestion, rhinorrhea and severe sore throat. Has associated frontal headache with photophobia. Has mild lightheadedness over the past day when she sits up from lying down, only occurs sometimes. Has tried claritin and singulair with no full relief. Denies fever, ear pain, difficulty swallowing, wheezing, shortness of breath, chest tightness and cough, chills, fatigue, nausea, vomiting, abdominal pain and diarrhea. 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 Has history of seasonal allergies, no history of asthma. Has hx of migraines for the past 2 years. Followed by a headache clinic. Takes rizatriptan as needed. Triggers includes soda and tomato paste. Notes this headache is not as bad as her typical migraines. Pt has hx of moderate anemia. She recieves iron infusions once or twice a year. Patient has not had flu shot this season.Denies smoking. Has only had one bottle of water today. Denies any other aggravating or relieving factors, no other questions or concerns.  Review of Systems  Constitutional: Negative for diaphoresis.  Eyes: Negative for blurred vision and double vision.  Neurological: Negative for tingling, sensory change, speech change, focal weakness and seizures.  Endo/Heme/Allergies: Positive for environmental allergies.     Melodie has a current medication list which includes the following prescription(s): aspirin-acetaminophen-caffeine, ibuprofen, prenatal vit-fe fumarate-fa, rizatriptan, azithromycin, fluticasone, and magic mouthwash w/lidocaine, and the following Facility-Administered Medications: alteplase, heparin lock flush, heparin lock flush, sodium chloride, and sodium chloride. Also is allergic to penicillins.  Arlynn  has a past  medical history of Allergy; Anemia; Migraines; and Seasonal allergies. Also  has a past surgical history that includes Appendectomy; Dilatation & curettage/hysteroscopy with trueclear (N/A, 05/09/2013); and Colposcopy (N/A, 05/09/2013).   Objective:   Vitals: BP 125/79   Pulse 94   Temp 98.4 F (36.9 C) (Oral)   Resp 16   Ht  (1.651 m)   Wt 172 lb (78 kg)   LMP 04/29/2016   SpO2 100%   BMI 28.62 kg/m   Physical Exam  Constitutional: She is oriented to person, place, and time. She appears well-developed and well-nourished. She appears distressed (mild distress, lying on exam table with the lights dimmed).  HENT:  Head: Normocephalic and atraumatic.  Right Ear: External ear and ear canal normal. Tympanic membrane is injected. A middle ear effusion is present.  Left Ear: External ear and ear canal normal. Tympanic membrane is injected. A middle ear effusion is present.  Nose: Mucosal edema (moderate bilaterally) present. Right sinus exhibits no maxillary sinus tenderness and no frontal sinus tenderness. Left sinus exhibits no maxillary sinus tenderness and no frontal sinus tenderness.  Mouth/Throat: Uvula is midline and mucous membranes are normal. Posterior oropharyngeal erythema present. Tonsils are 2+ on the right. Tonsils are 2+ on the left. No tonsillar exudate.  Eyes: Conjunctivae are normal.  Neck: Normal range of motion.  Cardiovascular: Normal rate, regular rhythm, normal heart sounds and intact distal pulses.   Pulmonary/Chest: Effort normal and breath sounds normal. No respiratory distress. She has no wheezes. She has no rales.  Lymphadenopathy:       Head (right side): No submental, no submandibular, no tonsillar, no preauricular, no posterior auricular and no occipital adenopathy present.       Head (left side): No submental, no submandibular, no tonsillar, no preauricular, no posterior  auricular and no occipital adenopathy present.    She has cervical adenopathy.        Right cervical: Posterior cervical adenopathy present. No superficial cervical and no deep cervical adenopathy present.      Left cervical: No superficial cervical, no deep cervical and no posterior cervical adenopathy present.       Right: No supraclavicular adenopathy present.       Left: No supraclavicular adenopathy present.  Neurological: She is alert and oriented to person, place, and time. She has normal strength. No cranial nerve deficit or sensory deficit. She displays a negative Romberg sign. Gait normal.  Reflex Scores:      Tricep reflexes are 2+ on the right side and 2+ on the left side.      Bicep reflexes are 2+ on the right side and 2+ on the left side.      Brachioradialis reflexes are 2+ on the right side and 2+ on the left side.      Patellar reflexes are 2+ on the right side and 2+ on the left side.      Achilles reflexes are 2+ on the right side and 2+ on the left side. Normal tandem walk.   Skin: Skin is warm and dry.  Psychiatric: She has a normal mood and affect.  Vitals reviewed.  Orthostatic VS for the past 24 hrs:  BP- Lying Pulse- Lying BP- Sitting Pulse- Sitting BP- Standing at 0 minutes Pulse- Standing at 0 minutes  05/25/16 1514 118/74 90 126/77 84 123/80 96     Results for orders placed or performed in visit on 05/25/16 (from the past 24 hour(s))  POCT rapid strep A     Status: None   Collection Time: 05/25/16  2:58 PM  Result Value Ref Range   Rapid Strep A Screen Negative Negative  POCT glucose (manual entry)     Status: None   Collection Time: 05/25/16  3:03 PM  Result Value Ref Range   POC Glucose 97 70 - 99 mg/dl  POCT CBC     Status: Abnormal   Collection Time: 05/25/16  3:04 PM  Result Value Ref Range   WBC 11.1 (A) 4.6 - 10.2 K/uL   Lymph, poc 1.8 0.6 - 3.4   POC LYMPH PERCENT 16.0 10 - 50 %L   MID (cbc) 0.6 0 - 0.9   POC MID % 5.5 0 - 12 %M   POC Granulocyte 8.7 (A) 2 - 6.9   Granulocyte percent 78.5 37 - 80 %G   RBC 4.46 4.04 - 5.48  M/uL   Hemoglobin 12.3 12.2 - 16.2 g/dL   HCT, POC 40.9 (A) 81.1 - 47.9 %   MCV 81.2 80 - 97 fL   MCH, POC 27.6 27 - 31.2 pg   MCHC 34.0 31.8 - 35.4 g/dL   RDW, POC 91.4 %   Platelet Count, POC 268 142 - 424 K/uL   MPV 8.1 0 - 99.8 fL  POCT urinalysis dipstick     Status: None   Collection Time: 05/25/16  3:29 PM  Result Value Ref Range   Color, UA yellow yellow   Clarity, UA clear clear   Glucose, UA negative negative mg/dL   Bilirubin, UA negative negative   Ketones, POC UA negative negative mg/dL   Spec Grav, UA 7.829 5.621 - 1.025   Blood, UA negative negative   pH, UA 7.0 5.0 - 8.0   Protein Ur, POC negative negative mg/dL   Urobilinogen, UA  0.2 0.2 or 1.0 E.U./dL   Nitrite, UA Negative Negative   Leukocytes, UA Negative Negative    Assessment and Plan :  1. Sore throat Will treat empirically for underlying bacterial etiology despite rapid strep results. Culture pending. Instructed to return to clinic if symptoms worsen, do not improve, or as needed. - POCT urinalysis dipstick - POCT rapid strep A - Culture, Group A Strep - azithromycin (ZITHROMAX) 250 MG tablet; Take 2 tabs PO x 1 dose, then 1 tab PO QD x 4 days  Dispense: 6 tablet; Refill: 0 - magic mouthwash w/lidocaine SOLN; Take 10 mLs by mouth every 2 (two) hours as needed for mouth pain.  Dispense: 360 mL; Refill: 0  2. Lightheadedness POCT labs and PE findings reassuring. Likely due to mild dehydration. Instructed to hydrate with at least 64 oz of water daily as she has not had much water today. Instructed to return to clinic if symptoms worsen, do not improve, or as needed - Orthostatic vital signs - POCT CBC - POCT glucose (manual entry)  3. Nonintractable headache, unspecified chronicity pattern, unspecified headache type Pt took excedrin migraine tablets post examination and notes her headache fully resolved.   4. Allergic rhinitis, unspecified seasonality, unspecified trigger - fluticasone (FLONASE) 50  MCG/ACT nasal spray; Place 2 sprays into both nostrils daily.  Dispense: 16 g; Refill: 0   Benjiman Core, PA-C  [facility] 05/25/2016 3:56 PM

## 2016-05-25 NOTE — Patient Instructions (Addendum)
Due to your physical exam findings and your white count, will treat you for strep throat despite negative rapid strep test. We should have the results of your culture back in a couple of days and I will contact you with these results. Please continue using your medication for headache. Perform salt water gargles for discomfort. If your throat pain is severe, you can use mouthwash which will help numb the throat. For allergies, try using daily flonase to help with the congestion.  Continue to eat soft foods/liquids until throat pain resolves. Please return to clinic if symptoms worsen, do not improve, or as needed.   Tonsillitis Tonsillitis is an infection of the throat. This infection causes the tonsils to become red, tender, and swollen. Tonsils are tissues in the back of your throat. If bacteria caused your infection, antibiotic medicine will be given to you. Sometimes, symptoms of this infection can be helped with the use of steroid medicine. If your tonsillitis is very bad (severe) and happens often, you may need to get your tonsils removed (tonsillectomy). Follow these instructions at home: Medicines   Take over-the-counter and prescription medicines only as told by your doctor.  If you were prescribed an antibiotic, take it as told by your doctor. Do not stop taking the antibiotic even if you start to feel better. Eating and drinking   Drink enough fluid to keep your pee (urine) clear or pale yellow.  While your throat is sore, eat soft or liquid foods like:  Soup.  Sherbert.  Instant breakfast drinks.  Drink warm fluids.  Eat frozen ice pops. General instructions   Rest as much as possible and get plenty of sleep.  Gargle with a salt-water mixture 3-4 times a day or as needed. To make a salt-water mixture, completely dissolve -1 tsp of salt in 1 cup of warm water.  Wash your hands often with soap and water. If there is no soap and water, use hand sanitizer.  Do not share  cups, bottles, or other utensils until your symptoms are gone.  Do not smoke. If you need help quitting, ask your doctor.  Keep all follow-up visits as told by your doctor. This is important. Contact a doctor if:  You have large, tender lumps in your neck.  You have a fever that does not go away after 2-3 days.  You have a rash.  You cough up green, yellow-brown, or bloody fluid.  You cannot swallow liquids or food for 24 hours.  Only one of your tonsils is swollen. Get help right away if:  You have any new symptoms such as:  Vomiting  Very bad headache  Stiff neck  Chest pain  Trouble breathing or swallowing  You have very bad throat pain and also have drooling or voice changes.  You have very bad pain that is not helped by medicine.  You cannot fully open your mouth.  You have redness, swelling, or severe pain anywhere in your neck. Summary  Tonsillitis causes your tonsils to be red, tender, and swollen.  While your throat is sore eat soft or liquid foods.  Gargle with a salt-water mixture 3-4 times a day or as needed.  Do not share cups, bottles, or other utensils until your symptoms are gone. This information is not intended to replace advice given to you by your health care provider. Make sure you discuss any questions you have with your health care provider. Document Released: 06/29/2007 Document Revised: 06/18/2015 Document Reviewed: 06/29/2012 Elsevier Interactive Patient  Education  2017 [employer].    IF you received an x-ray today, you will receive an invoice from [facility]. Please contact [facility] at [phone] with questions or concerns regarding your invoice.   IF you received labwork today, you will receive an invoice from [place]. Please contact [facility] at [phone] with questions or concerns regarding your invoice.   Our billing staff will not be able to assist you with questions regarding bills from these  companies.  You will be contacted with the lab results as soon as they are available. The fastest way to get your results is to activate your My Chart account. Instructions are located on the last page of this paperwork. If you have not heard from [place] regarding the results in 2 weeks, please contact this office.

## 2016-05-27 LAB — CULTURE, GROUP A STREP: STREP A CULTURE: NEGATIVE

## 2016-06-06 ENCOUNTER — Ambulatory Visit (INDEPENDENT_AMBULATORY_CARE_PROVIDER_SITE_OTHER): Payer: BLUE CROSS/BLUE SHIELD | Admitting: Physician Assistant

## 2016-06-06 ENCOUNTER — Encounter: Payer: Self-pay | Admitting: Physician Assistant

## 2016-06-06 VITALS — BP 108/75 | HR 72 | Temp 97.8°F | Resp 17 | Ht 65.0 in | Wt 172.0 lb

## 2016-06-06 DIAGNOSIS — R0981 Nasal congestion: Secondary | ICD-10-CM

## 2016-06-06 DIAGNOSIS — J3489 Other specified disorders of nose and nasal sinuses: Secondary | ICD-10-CM | POA: Diagnosis not present

## 2016-06-06 DIAGNOSIS — J309 Allergic rhinitis, unspecified: Secondary | ICD-10-CM | POA: Diagnosis not present

## 2016-06-06 LAB — POCT CBC
GRANULOCYTE PERCENT: 51.8 % (ref 37–80)
HCT, POC: 36.6 % — AB (ref 37.7–47.9)
Hemoglobin: 12.2 g/dL (ref 12.2–16.2)
Lymph, poc: 2.4 (ref 0.6–3.4)
MCH, POC: 27.3 pg (ref 27–31.2)
MCHC: 33.4 g/dL (ref 31.8–35.4)
MCV: 81.7 fL (ref 80–97)
MID (cbc): 0.3 (ref 0–0.9)
MPV: 7.4 fL (ref 0–99.8)
PLATELET COUNT, POC: 294 10*3/uL (ref 142–424)
POC Granulocyte: 2.8 (ref 2–6.9)
POC LYMPH %: 43.3 % (ref 10–50)
POC MID %: 4.9 %M (ref 0–12)
RBC: 4.48 M/uL (ref 4.04–5.48)
RDW, POC: 13.9 %
WBC: 5.5 10*3/uL (ref 4.6–10.2)

## 2016-06-06 MED ORDER — PREDNISONE 10 MG PO TABS: ORAL_TABLET | ORAL | 0 refills | Status: DC

## 2016-06-06 MED ORDER — CETIRIZINE HCL 10 MG PO TABS: 10.0000 mg | ORAL_TABLET | Freq: Every day | ORAL | 1 refills | Status: DC

## 2016-06-06 NOTE — Patient Instructions (Addendum)
Please take prednisone as prescribed. Note that it may cause nausea, vomiting, increased heart rate, irritability, and insomnia. Take in the morning with a full meal to help decrease these side effects. Please continue to use zyrtec daily. -Return to clinic if symptoms worsen, do not improve in 7-10 days, or as needed. Thank you for letting me participate in your health and well being.     Sinusitis, Adult Sinusitis is soreness and inflammation of your sinuses. Sinuses are hollow spaces in the bones around your face. They are located:  Around your eyes.  In the middle of your forehead.  Behind your nose.  In your cheekbones. Your sinuses and nasal passages are lined with a stringy fluid (mucus). Mucus normally drains out of your sinuses. When your nasal tissues get inflamed or swollen, the mucus can get trapped or blocked so air cannot flow through your sinuses. This lets bacteria, viruses, and funguses grow, and that leads to infection. Follow these instructions at home: Medicines   Take, use, or apply over-the-counter and prescription medicines only as told by your doctor. These may include nasal sprays.  If you were prescribed an antibiotic medicine, take it as told by your doctor. Do not stop taking the antibiotic even if you start to feel better. Hydrate and Humidify   Drink enough water to keep your pee (urine) clear or pale yellow.  Use a cool mist humidifier to keep the humidity level in your home above 50%.  Breathe in steam for 10-15 minutes, 3-4 times a day or as told by your doctor. You can do this in the bathroom while a hot shower is running.  Try not to spend time in cool or dry air. Rest   Rest as much as possible.  Sleep with your head raised (elevated).  Make sure to get enough sleep each night. General instructions   Put a warm, moist washcloth on your face 3-4 times a day or as told by your doctor. This will help with discomfort.  Wash your hands often  with soap and water. If there is no soap and water, use hand sanitizer.  Do not smoke. Avoid being around people who are smoking (secondhand smoke).  Keep all follow-up visits as told by your doctor. This is important. Contact a doctor if:  You have a fever.  Your symptoms get worse.  Your symptoms do not get better within 10 days. Get help right away if:  You have a very bad headache.  You cannot stop throwing up (vomiting).  You have pain or swelling around your face or eyes.  You have trouble seeing.  You feel confused.  Your neck is stiff.  You have trouble breathing. This information is not intended to replace advice given to you by your health care provider. Make sure you discuss any questions you have with your health care provider. Document Released: 06/29/2007 Document Revised: 09/06/2015 Document Reviewed: 11/05/2014 Elsevier Interactive Patient Education  2017 [employer]     IF you received an x-ray today, you will receive an invoice from [facility]. Please contact [facility] at [phone] with questions or concerns regarding your invoice.   IF you received labwork today, you will receive an invoice from [facility]. Please contact [facility] at [phone] with questions or concerns regarding your invoice.   Our billing staff will not be able to assist you with questions regarding bills from these companies.  You will be contacted with the lab results as soon as they are available.  The fastest way to get your results is to activate your My Chart account. Instructions are located on the last page of this paperwork. If you have not heard from [place] regarding the results in 2 weeks, please contact this office.

## 2016-06-06 NOTE — Progress Notes (Signed)
MRN: [id number] DOB: 27-May-1990  Subjective:   Peggy Kelly is a 26 y.o. female presenting for chief complaint of Ear Fullness; Cough; and Sinusitis .  Reports 2 week history of sinus pressure, sinus headache, nasal congestion, and rhinorrhea. Was initially evaluated by me in office on 05/25/16 for severe sore throat and headache. She was having sinus congestion at this time as well. Please see that OV note for further details. Tx empirically with a zpack. Notes her sore throat improved but her sinus pressure has not. Has tried zyrtec-d, flonase, ibuprofen, and bendryl consistently with no relief. Denies fever, wheezing, shortness of breath and myalgia, chills, nausea, vomiting and abdominal pain. Has not had sick contact with anyone. Has history of seasonal allergies but notes they have not been this bad in a while. No history of asthma. No smoking, No new environmental exposures. Denies any other aggravating or relieving factors, no other questions or concerns.  Peggy Kelly has a current medication list which includes the following prescription(s): aspirin-acetaminophen-caffeine, fluticasone, ibuprofen, prenatal vit-fe fumarate-fa, rizatriptan, and cetirizine, and the following Facility-Administered Medications: alteplase, heparin lock flush, heparin lock flush, sodium chloride, and sodium chloride. Also is allergic to penicillins.  Peggy Kelly  has a past medical history of Allergy; Anemia; Migraines; and Seasonal allergies. Also  has a past surgical history that includes Appendectomy; Dilatation & curettage/hysteroscopy with trueclear (N/A, 05/09/2013); and Colposcopy (N/A, 05/09/2013).   Objective:   Vitals: BP 108/75 (BP Location: Right Arm, Patient Position: Sitting, Cuff Size: Normal)   Pulse 72   Temp 97.8 F (36.6 C) (Oral)   Resp 17   Ht 5\' 5"  (1.651 m)   Wt 172 lb (78 kg)   LMP 05/25/2016   SpO2 98%   BMI 28.62 kg/m   Physical Exam  Constitutional: She is oriented to person,  place, and time. She appears well-developed and well-nourished. No distress.  HENT:  Head: Normocephalic and atraumatic.  Right Ear: External ear and ear canal normal. A middle ear effusion is present.  Left Ear: External ear and ear canal normal. A middle ear effusion is present.  Nose: Mucosal edema (severe, bilaterally) present. Right sinus exhibits no maxillary sinus tenderness and no frontal sinus tenderness. Left sinus exhibits no maxillary sinus tenderness and no frontal sinus tenderness.  Mouth/Throat: Uvula is midline. Posterior oropharyngeal erythema present. Tonsils are 1+ on the right. Tonsils are 1+ on the left. No tonsillar exudate.  Eyes: Conjunctivae are normal.  Neck: Normal range of motion.  Cardiovascular: Normal rate, regular rhythm and normal heart sounds.   Pulmonary/Chest: Effort normal and breath sounds normal. She has no wheezes. She has no rales.  Lymphadenopathy:       Head (right side): No submental, no submandibular, no tonsillar, no preauricular, no posterior auricular and no occipital adenopathy present.       Head (left side): No submental, no submandibular, no tonsillar, no preauricular, no posterior auricular and no occipital adenopathy present.    She has no cervical adenopathy.       Right: No supraclavicular adenopathy present.       Left: No supraclavicular adenopathy present.  Neurological: She is alert and oriented to person, place, and time.  Skin: Skin is warm and dry.  Psychiatric: She has a normal mood and affect.  Vitals reviewed.   Results for orders placed or performed in visit on 06/06/16 (from the past 24 hour(s))  POCT CBC     Status: Abnormal   Collection Time: 06/06/16  4:25  PM  Result Value Ref Range   WBC 5.5 4.6 - 10.2 K/uL   Lymph, poc 2.4 0.6 - 3.4   POC LYMPH PERCENT 43.3 10 - 50 %L   MID (cbc) 0.3 0 - 0.9   POC MID % 4.9 0 - 12 %M   POC Granulocyte 2.8 2 - 6.9   Granulocyte percent 51.8 37 - 80 %G   RBC 4.48 4.04 - 5.48 M/uL    Hemoglobin 12.2 12.2 - 16.2 g/dL   HCT, POC 16.1 (A) 09.6 - 47.9 %   MCV 81.7 80 - 97 fL   MCH, POC 27.3 27 - 31.2 pg   MCHC 33.4 31.8 - 35.4 g/dL   RDW, POC 04.5 %   Platelet Count, POC 294 142 - 424 K/uL   MPV 7.4 0 - 99.8 fL    Assessment and Plan :  1. Sinus pressure - POCT CBC - predniSONE (DELTASONE) 10 MG tablet; 6-5-4-3-2-1 taper. Take all tablets for that day in the am with food.  Dispense: 21 tablet; Refill: 0 2. Nasal congestion Due to the severity of mucosal edema and duration of sinus pressure and no improvement with antibiotic, pt would likely benefit from systemic steroid taper at this time. Instructed to return to clinic if symptoms worsen, do not improve in 7-10 days, or as needed. Consider allergy referral if she returns with no improvement.  - predniSONE (DELTASONE) 10 MG tablet; 6-5-4-3-2-1 taper. Take all tablets for that day in the am with food.  Dispense: 21 tablet; Refill: 0  3. Allergic rhinitis, unspecified seasonality, unspecified trigger Instructed to take daily.  - cetirizine (ZYRTEC) 10 MG tablet; Take 1 tablet (10 mg total) by mouth daily.  Dispense: 90 tablet; Refill: 1   Benjiman Core, PA-C  [facility] 06/06/2016 4:31 PM

## 2016-07-12 ENCOUNTER — Other Ambulatory Visit: Payer: Self-pay | Admitting: Physician Assistant

## 2016-07-12 DIAGNOSIS — J309 Allergic rhinitis, unspecified: Secondary | ICD-10-CM

## 2016-09-19 ENCOUNTER — Other Ambulatory Visit (HOSPITAL_COMMUNITY): Payer: Self-pay | Admitting: Obstetrics and Gynecology

## 2016-09-19 DIAGNOSIS — Z3141 Encounter for fertility testing: Secondary | ICD-10-CM

## 2016-09-23 ENCOUNTER — Ambulatory Visit (HOSPITAL_COMMUNITY)
Admission: RE | Admit: 2016-09-23 | Discharge: 2016-09-23 | Disposition: A | Discharge status: Home / Self Care | Payer: BLUE CROSS/BLUE SHIELD | Source: Ambulatory Visit | Attending: Obstetrics and Gynecology | Admitting: Obstetrics and Gynecology

## 2016-09-23 DIAGNOSIS — N979 Female infertility, unspecified: Secondary | ICD-10-CM | POA: Diagnosis not present

## 2016-09-23 DIAGNOSIS — Z3141 Encounter for fertility testing: Secondary | ICD-10-CM | POA: Insufficient documentation

## 2016-09-23 MED ORDER — IOPAMIDOL (ISOVUE-300) INJECTION 61%
30.0000 mL | Freq: Once | INTRAVENOUS | Status: AC | PRN
  Administered 2016-09-23: 6 mL

## 2016-11-08 DIAGNOSIS — N978 Female infertility of other origin: Secondary | ICD-10-CM | POA: Diagnosis not present

## 2016-11-08 DIAGNOSIS — Z3141 Encounter for fertility testing: Secondary | ICD-10-CM | POA: Diagnosis not present

## 2016-11-12 DIAGNOSIS — Z3141 Encounter for fertility testing: Secondary | ICD-10-CM | POA: Diagnosis not present

## 2016-11-15 DIAGNOSIS — Z3183 Encounter for assisted reproductive fertility procedure cycle: Secondary | ICD-10-CM | POA: Diagnosis not present

## 2016-11-18 DIAGNOSIS — Z3183 Encounter for assisted reproductive fertility procedure cycle: Secondary | ICD-10-CM | POA: Diagnosis not present

## 2016-11-21 DIAGNOSIS — Z3183 Encounter for assisted reproductive fertility procedure cycle: Secondary | ICD-10-CM | POA: Diagnosis not present

## 2016-11-23 DIAGNOSIS — Z3183 Encounter for assisted reproductive fertility procedure cycle: Secondary | ICD-10-CM | POA: Diagnosis not present

## 2016-11-29 DIAGNOSIS — Z3183 Encounter for assisted reproductive fertility procedure cycle: Secondary | ICD-10-CM | POA: Diagnosis not present

## 2016-12-05 DIAGNOSIS — E289 Ovarian dysfunction, unspecified: Secondary | ICD-10-CM | POA: Diagnosis not present

## 2016-12-12 DIAGNOSIS — N912 Amenorrhea, unspecified: Secondary | ICD-10-CM | POA: Diagnosis not present

## 2016-12-14 DIAGNOSIS — E289 Ovarian dysfunction, unspecified: Secondary | ICD-10-CM | POA: Diagnosis not present

## 2016-12-21 DIAGNOSIS — N911 Secondary amenorrhea: Secondary | ICD-10-CM | POA: Diagnosis not present

## 2016-12-21 DIAGNOSIS — Z3201 Encounter for pregnancy test, result positive: Secondary | ICD-10-CM | POA: Diagnosis not present

## 2016-12-28 DIAGNOSIS — N911 Secondary amenorrhea: Secondary | ICD-10-CM | POA: Diagnosis not present

## 2016-12-28 DIAGNOSIS — E289 Ovarian dysfunction, unspecified: Secondary | ICD-10-CM | POA: Diagnosis not present

## 2016-12-30 DIAGNOSIS — N911 Secondary amenorrhea: Secondary | ICD-10-CM | POA: Diagnosis not present

## 2017-01-04 DIAGNOSIS — N911 Secondary amenorrhea: Secondary | ICD-10-CM | POA: Diagnosis not present

## 2017-01-12 DIAGNOSIS — Z3401 Encounter for supervision of normal first pregnancy, first trimester: Secondary | ICD-10-CM | POA: Diagnosis not present

## 2017-01-12 DIAGNOSIS — Z3685 Encounter for antenatal screening for Streptococcus B: Secondary | ICD-10-CM | POA: Diagnosis not present

## 2017-01-12 DIAGNOSIS — Z23 Encounter for immunization: Secondary | ICD-10-CM | POA: Diagnosis not present

## 2017-01-12 LAB — OB RESULTS CONSOLE ANTIBODY SCREEN: Antibody Screen: NEGATIVE

## 2017-01-12 LAB — OB RESULTS CONSOLE RPR: RPR: NONREACTIVE

## 2017-01-12 LAB — OB RESULTS CONSOLE HEPATITIS B SURFACE ANTIGEN: Hepatitis B Surface Ag: NEGATIVE

## 2017-01-12 LAB — OB RESULTS CONSOLE GC/CHLAMYDIA
CHLAMYDIA, DNA PROBE: NEGATIVE
Gonorrhea: NEGATIVE

## 2017-01-12 LAB — OB RESULTS CONSOLE ABO/RH: RH TYPE: POSITIVE

## 2017-01-12 LAB — OB RESULTS CONSOLE HIV ANTIBODY (ROUTINE TESTING): HIV: NONREACTIVE

## 2017-01-14 ENCOUNTER — Other Ambulatory Visit: Payer: Self-pay

## 2017-01-14 ENCOUNTER — Emergency Department (HOSPITAL_COMMUNITY)
Admission: EM | Admit: 2017-01-14 | Discharge: 2017-01-14 | Disposition: A | Discharge status: Home / Self Care | Payer: BLUE CROSS/BLUE SHIELD | Attending: Emergency Medicine | Admitting: Emergency Medicine

## 2017-01-14 ENCOUNTER — Encounter (HOSPITAL_COMMUNITY): Payer: Self-pay | Admitting: *Deleted

## 2017-01-14 ENCOUNTER — Emergency Department (HOSPITAL_COMMUNITY): Payer: BLUE CROSS/BLUE SHIELD

## 2017-01-14 DIAGNOSIS — Z3A09 9 weeks gestation of pregnancy: Secondary | ICD-10-CM | POA: Diagnosis not present

## 2017-01-14 DIAGNOSIS — O26851 Spotting complicating pregnancy, first trimester: Secondary | ICD-10-CM | POA: Diagnosis not present

## 2017-01-14 DIAGNOSIS — Z79899 Other long term (current) drug therapy: Secondary | ICD-10-CM | POA: Diagnosis not present

## 2017-01-14 DIAGNOSIS — O209 Hemorrhage in early pregnancy, unspecified: Secondary | ICD-10-CM | POA: Diagnosis not present

## 2017-01-14 LAB — I-STAT BETA HCG BLOOD, ED (MC, WL, AP ONLY): I-stat hCG, quantitative: >2000 m[IU]/mL — ABNORMAL HIGH (ref <5)

## 2017-01-14 LAB — CBC
HEMATOCRIT: 32.6 % — AB (ref 36.0–46.0)
Hemoglobin: 10.8 g/dL — ABNORMAL LOW (ref 12.0–15.0)
MCH: 27.2 pg (ref 26.0–34.0)
MCHC: 33.1 g/dL (ref 30.0–36.0)
MCV: 82.1 fL (ref 78.0–100.0)
PLATELETS: 307 10*3/uL (ref 150–400)
RBC: 3.97 MIL/uL (ref 3.87–5.11)
RDW: 13.9 % (ref 11.5–15.5)
WBC: 10.7 10*3/uL — AB (ref 4.0–10.5)

## 2017-01-14 LAB — COMPREHENSIVE METABOLIC PANEL
ALBUMIN: 3.3 g/dL — AB (ref 3.5–5.0)
ALT: 11 U/L — AB (ref 14–54)
AST: 16 U/L (ref 15–41)
Alkaline Phosphatase: 47 U/L (ref 38–126)
Anion gap: 7 (ref 5–15)
BUN: 6 mg/dL (ref 6–20)
CHLORIDE: 104 mmol/L (ref 101–111)
CO2: 23 mmol/L (ref 22–32)
CREATININE: 0.58 mg/dL (ref 0.44–1.00)
Calcium: 9 mg/dL (ref 8.9–10.3)
GFR calc Af Amer: >60 mL/min (ref >60)
GFR calc non Af Amer: >60 mL/min (ref >60)
Glucose, Bld: 96 mg/dL (ref 65–99)
POTASSIUM: 3.5 mmol/L (ref 3.5–5.1)
SODIUM: 134 mmol/L — AB (ref 135–145)
Total Bilirubin: 0.2 mg/dL — ABNORMAL LOW (ref 0.3–1.2)
Total Protein: 6.8 g/dL (ref 6.5–8.1)

## 2017-01-14 LAB — HCG, QUANTITATIVE, PREGNANCY: HCG, BETA CHAIN, QUANT, S: 89542 m[IU]/mL — AB (ref <5)

## 2017-01-14 MED ORDER — ACETAMINOPHEN 325 MG PO TABS
650.0000 mg | ORAL_TABLET | Freq: Once | ORAL | Status: AC
  Administered 2017-01-14: 650 mg via ORAL
  Filled 2017-01-14: qty 2

## 2017-01-14 NOTE — ED Triage Notes (Signed)
The pt is [redacted] weeks pregnant ivf.  Tonight when she wiped after voiding she haa small amount of blood on the tissue  lmp oct 20

## 2017-01-14 NOTE — Discharge Instructions (Signed)
Please read and follow all provided instructions.  Your diagnoses today include:  1. [redacted] weeks gestation of pregnancy     Tests performed today include: Vital signs. See below for your results today.   Medications prescribed:  Take as prescribed   Home care instructions:  Follow any educational materials contained in this packet.  Follow-up instructions: Please follow-up with your OBGYN for further evaluation of symptoms and treatment   Return instructions:  Please return to the Emergency Department if you do not get better, if you get worse, or new symptoms OR  - Fever (temperature greater than 101.43F)  - Bleeding that does not stop with holding pressure to the area    -Severe pain (please note that you may be more sore the day after your accident)  - Chest Pain  - Difficulty breathing  - Severe nausea or vomiting  - Inability to tolerate food and liquids  - Passing out  - Skin becoming red around your wounds  - Change in mental status (confusion or lethargy)  - New numbness or weakness    Please return if you have any other emergent concerns.  Additional Information:  Your vital signs today were: BP 128/69    Pulse 82    Temp 97.8 F (36.6 C) (Oral)    Resp 18    Ht 5\' 4"  (1.626 m)    Wt 81.6 kg (180 lb)    LMP 11/12/2016    SpO2 100%    BMI 30.90 kg/m  If your blood pressure (BP) was elevated above 135/85 this visit, please have this repeated by your doctor within one month. ---------------

## 2017-01-14 NOTE — ED Provider Notes (Signed)
[facility]ENC[facility] Provider Note   CSN: [id number] Arrival date & time: 01/14/17  0022     History   Chief Complaint Chief Complaint  Patient presents with  . Hematuria    HPI Peggy Kelly is a 26 y.o. female.  HPI  26 y.o. female G1P1 via IVF, presents to the Emergency Department today due to either vaginal bleeding or hematuria. Pt unsure. Pt around [redacted] weeks pregnant. Pt went to bathroom and began to wipe and noted spotting. Denies abdominal pain. Notes intermittent cramping, but states this is common. Notes nausea without emesis. No CP/SOB. No fevers. Pt denies pain currently. Pt requesting ultrasound as she is worried her pregnancy may be compromised. No other symptoms noted   Past Medical History:  Diagnosis Date  . Allergy   . Anemia   . Migraines   . Seasonal allergies     Patient Active Problem List   Diagnosis Date Noted  . Iron deficiency anemia 02/25/2014    Past Surgical History:  Procedure Laterality Date  . APPENDECTOMY    . COLPOSCOPY N/A 05/09/2013   Procedure: COLPOSCOPY WITH ECC;  Surgeon: Zelphia CairoGretchen Adkins, MD;  Location: [facility] ORS;  Service: Gynecology;  Laterality: N/A;  . DILATATION & CURETTAGE/HYSTEROSCOPY WITH TRUECLEAR N/A 05/09/2013   Procedure: DILATATION & CURETTAGE/HYSTEROSCOPY WITH TRUCLEAR;  Surgeon: Zelphia CairoGretchen Adkins, MD;  Location: [facility] OR[facility];  Service: Gynecology;  Laterality: N/A;    OB History    Gravida Para Term Preterm AB Living   1             SAB TAB Ectopic Multiple Live Births                   Home Medications    Prior to Admission medications   Medication Sig Start Date End Date Taking? Authorizing Provider  aspirin-acetaminophen-caffeine (EXCEDRIN MIGRAINE) [phone] MG tablet Take by mouth every 6 (six) hours as needed for headache.    [provider]  cetirizine (ZYRTEC) 10 MG tablet Take 1 tablet (10 mg total) by mouth daily. 06/06/16   Benjiman CoreWiseman, Brittany D, PA-C  fluticasone  (FLONASE) 50 MCG/ACT nasal spray SPRAY 2 SPRAYS INTO EACH NOSTRIL EVERY DAY 07/15/16   Benjiman CoreWiseman, Brittany D, PA-C  ibuprofen (ADVIL,MOTRIN) 200 MG tablet Take 200 mg by mouth every 6 (six) hours as needed.    [provider]  predniSONE (DELTASONE) 10 MG tablet 6-5-4-3-2-1 taper. Take all tablets for that day in the am with food. 06/06/16   Benjiman CoreWiseman, Brittany D, PA-C  Prenatal Vit-Fe Fumarate-FA (PRENATAL VITAMIN PO) Take 1 capsule by mouth daily.    [provider]  rizatriptan (MAXALT-MLT) 10 MG disintegrating tablet Take 1 tablet (10 mg total) by mouth as needed for migraine. May repeat in 2 hours if needed 09/23/15   Penumalli, Glenford BayleyVikram R, MD    Family History Family History  Problem Relation Age of Onset  . Migraines Sister   . Cancer Maternal Grandfather        prostate    Social History Social History   Tobacco Use  . Smoking status: Never Smoker  . Smokeless tobacco: Never Used  Substance Use Topics  . Alcohol use: Yes    Comment: occassional, 09/23/15 none now  . Drug use: No     Allergies   Penicillins   Review of Systems Review of Systems ROS reviewed and all are negative for acute change except as noted in the HPI.  Physical Exam Updated Vital Signs  BP (!) 131/97 (BP Location: Left Arm)   Pulse 86   Temp 97.8 F (36.6 C) (Oral)   Resp 18   Ht 5\' 4"  (1.626 m)   Wt 81.6 kg (180 lb)   LMP 11/12/2016   SpO2 100%   BMI 30.90 kg/m   Physical Exam  Constitutional: She is oriented to person, place, and time. She appears well-developed and well-nourished. No distress.  HENT:  Head: Normocephalic and atraumatic.  Right Ear: Tympanic membrane, external ear and ear canal normal.  Left Ear: Tympanic membrane, external ear and ear canal normal.  Nose: Nose normal.  Mouth/Throat: Uvula is midline, oropharynx is clear and moist and mucous membranes are normal. No trismus in the jaw. No oropharyngeal exudate, posterior oropharyngeal erythema or tonsillar  abscesses.  Eyes: EOM are normal. Pupils are equal, round, and reactive to light.  Neck: Normal range of motion. Neck supple. No tracheal deviation present.  Cardiovascular: Normal rate, regular rhythm, S1 normal, S2 normal, normal heart sounds, intact distal pulses and normal pulses.  Pulmonary/Chest: Effort normal and breath sounds normal. No respiratory distress. She has no decreased breath sounds. She has no wheezes. She has no rhonchi. She has no rales.  Abdominal: Normal appearance and bowel sounds are normal. There is no tenderness.  Musculoskeletal: Normal range of motion.  Neurological: She is alert and oriented to person, place, and time.  Skin: Skin is warm and dry.  Psychiatric: She has a normal mood and affect. Her speech is normal and behavior is normal. Thought content normal.  Nursing note and vitals reviewed.    ED Treatments / Results  Labs (all labs ordered are listed, but only abnormal results are displayed) Labs Reviewed  COMPREHENSIVE METABOLIC PANEL - Abnormal; Notable for the following components:      Result Value   Sodium 134 (*)    Albumin 3.3 (*)    ALT 11 (*)    Total Bilirubin 0.2 (*)    All other components within normal limits  CBC - Abnormal; Notable for the following components:   WBC 10.7 (*)    Hemoglobin 10.8 (*)    HCT 32.6 (*)    All other components within normal limits  HCG, QUANTITATIVE, PREGNANCY - Abnormal; Notable for the following components:   hCG, Beta Chain, Quant, S [id number] (*)    All other components within normal limits  I-STAT BETA HCG BLOOD, ED ([facility], [facility], [facility] ONLY) - Abnormal; Notable for the following components:   I-stat hCG, quantitative >2,000.0 (*)    All other components within normal limits  URINALYSIS, ROUTINE W REFLEX MICROSCOPIC    EKG  EKG Interpretation None       Radiology [place] Ob Comp < 14 Wks  Result Date: 01/14/2017 CLINICAL DATA:  Acute onset of vaginal bleeding. EXAM: OBSTETRIC <14 WK ULTRASOUND  TECHNIQUE: Transabdominal ultrasound was performed for evaluation of the gestation as well as the maternal uterus and adnexal regions. COMPARISON:  CT of the abdomen and pelvis performed 10/31/2015 FINDINGS: Intrauterine gestational sac: Single; visualized and normal in shape. Yolk sac:  Yes Embryo:  Yes Cardiac Activity: Yes Heart Rate: 163 bpm CRL:   2.26 cm   9 w 0 d                  US EDC: 08/19/2016 Subchorionic hemorrhage:  None visualized. Maternal uterus/adnexae: The uterus is otherwise unremarkable. No twin pregnancy is currently seen. The ovaries are grossly unremarkable in appearance, measuring 5.6 x 3.2 x  4.2 cm on the right, and 6.9 x 4.2 x 5.9 cm on the left. No suspicious adnexal masses are seen; there is no evidence for ovarian torsion. No free fluid is seen within the pelvic cul-de-sac. IMPRESSION: Single live intrauterine pregnancy noted, with a crown-rump length of 2.3 cm, corresponding to a gestational age of [redacted] weeks 0 days. This matches the gestational age by LMP, reflecting an estimated date of delivery of August 19, 2016. Electronically Signed   By: Roanna Raider M.D.   On: 01/14/2017 05:31    Procedures Procedures (including critical care time)  Medications Ordered in ED Medications  acetaminophen (TYLENOL) tablet 650 mg (650 mg Oral Given 01/14/17 0445)     Initial Impression / Assessment and Plan / ED Course  I have reviewed the triage vital signs and the nursing notes.  Pertinent labs & imaging results that were available during my care of the patient were reviewed by me and considered in my medical decision making (see chart for details).  Final Clinical Impressions(s) / ED Diagnoses  {I have reviewed and evaluated the relevant laboratory values. {I have reviewed and evaluated the relevant imaging studies.  {I have reviewed the relevant previous healthcare records.  {I obtained HPI from historian.   ED Course:  Assessment: Pt is a 26 y.o. female G1P1 via IVF,  presents to the Emergency Department today due to either vaginal bleeding or hematuria. Pt unsure. Pt around [redacted] weeks pregnant. Pt went to bathroom and began to wipe and noted spotting. Denies abdominal pain. Notes intermittent cramping, but states this is common. Notes nausea without emesis. No CP/SOB. No fevers. Pt denies pain currently. Pt requesting ultrasound as she is worried her pregnancy may be compromised. On exam, pt in NAD. Nontoxic/nonseptic appearing. VSS. Afebrile. Lungs CTA. Heart RRR. Abdomen nontender soft. Labs unremarkable. HCG Quant [id number]. OB US with IUP 9weeks. No complications. Plan is to DC home with follow up to GYN. At time of discharge, Patient is in no acute distress. Vital Signs are stable. Patient is able to ambulate. Patient able to tolerate PO.   Disposition/Plan:  DC Home Additional Verbal discharge instructions given and discussed with patient.  Pt Instructed to f/u with GYN in the next week for evaluation and treatment of symptoms. Return precautions given Pt acknowledges and agrees with plan  Supervising Physician Dione Booze, MD  Final diagnoses:  [redacted] weeks gestation of pregnancy    ED Discharge Orders    None       Audry Pili, PA-C 01/14/17 0617    Dione Booze, MD 01/14/17 [phone]

## 2017-02-16 ENCOUNTER — Other Ambulatory Visit: Payer: Self-pay

## 2017-02-16 ENCOUNTER — Encounter (HOSPITAL_COMMUNITY): Payer: Self-pay | Admitting: MS"

## 2017-02-16 ENCOUNTER — Encounter (HOSPITAL_COMMUNITY): Payer: Self-pay

## 2017-02-16 ENCOUNTER — Ambulatory Visit (HOSPITAL_COMMUNITY)
Admission: RE | Admit: 2017-02-16 | Discharge: 2017-02-16 | Disposition: A | Discharge status: Home / Self Care | Payer: Managed Care, Other (non HMO) | Source: Ambulatory Visit | Attending: Obstetrics and Gynecology | Admitting: Obstetrics and Gynecology

## 2017-02-16 DIAGNOSIS — Z3A13 13 weeks gestation of pregnancy: Secondary | ICD-10-CM | POA: Insufficient documentation

## 2017-02-16 DIAGNOSIS — O289 Unspecified abnormal findings on antenatal screening of mother: Secondary | ICD-10-CM | POA: Insufficient documentation

## 2017-02-16 NOTE — Progress Notes (Signed)
Genetic Counseling  High-Risk Gestation Note  Appointment Date:  02/16/2017 Referred By: Peggy Cairo, MD Date of Birth:  1990/09/10 Partner:  Peggy Kelly   Pregnancy History: G1P0 Estimated Date of Delivery: 08/19/17 Estimated Gestational Age: [redacted]w[redacted]d Attending: Particia Nearing, MD   [name] and her husband, Mr. Peggy Kelly, were seen for genetic counseling because of an increased risk for fetal Down syndrome based on First Trimester screening.  In summary:  Currently pregnancy conceived with IVF with donor sperm; demise of twin B noted at approximately [redacted] weeks gestation  Reviewed results of first trimester screening test  Increased risk for Down syndrome (1 in 299)  NT reported at 95%tile- discussed associations with couple  Discussed additional screening options  NIPS for aneuploidy- declined today but plan to further consider option of Panorama  Ultrasound- scheduled in our office 03/21/17  Fetal Echocardiogram given increased NT measurement- not scheduled at this time  Discussed diagnostic testing options  Amniocentesis- declined  Reviewed family history concerns  Discussed general population carrier screening options- patient reported she had negative carrier screening through [facility] through [facility] provider in [place], [place]  The couple reported that the current pregnancy was conceived via IVF at [facility] in [place] with donor sperm. They reported that preimplantation genetic testing for aneuploidy (PGT-A) was not performed. The pregnancy was originally reported to be dichorionic/diamniotic twin gestation, with demise of twin B noted at 7 week ultrasound.   They were counseled regarding the screening result and the associated 1 in 299 risk for fetal Down syndrome.  We reviewed chromosomes, nondisjunction, and the common features and variable prognosis of Down syndrome.  In addition, we reviewed the screen adjusted reduction in risks  for trisomy 18/13 (1 in 1451 to 1 in >10,000).  We also discussed other explanations for a screen positive result including: differences in maternal metabolism and normal variation. Additionally, we discussed that the nuchal translucency measurement was reported to be 2.6 mm at a crown rump length of 61.6, which is reported to be at the 95%tile.  We discussed that the fetal NT refers to a fluid filled space between the skin and soft tissues behind the cervical spine. This space is traditionally measurable between 11 and 13.[redacted] weeks gestation and is considered enlarged when the measurement is equal to or greater than the 95th percentile for the gestational age. This couple was counseled regarding the various common etiologies for an enlarged NT including: aneuploidy, single gene conditions, cardiac or great vessel abnormalities, lymphatic system failure, decreased fetal movement, and fetal anemia.   We reviewed other available screening options for fetal aneuploidy, including noninvasive prenatal screening (NIPS)/cell free DNA (cfDNA) screening, and detailed ultrasound. They were counseled that screening tests are used to modify a patient's a priori risk for aneuploidy, typically based on age. This estimate provides a pregnancy specific risk assessment. We reviewed the benefits and limitations of each option. Specifically, we discussed the conditions for which each test screens, the detection rates, and false positive rates of each. They were also counseled regarding diagnostic testing via amniocentesis. We reviewed the associated risks for complications, including spontaneous pregnancy loss. We discussed the possible results that the tests might provide including: positive, negative, unanticipated, and no result. Finally, they were counseled regarding the cost of each option and potential out of pocket expenses.   After consideration of all the options, they declined amniocentesis. The couple declined NIPS  today and plan to further consider this option. Detailed ultrasound is  scheduled in our office for 03/21/17.   In addition, we discussed that an NT of 2.6 mm is associated with an approximate 1% risk for a fetal cardiac anomaly. We discussed the options of a fetal echocardiogram and detailed anatomy ultrasound as methods to evaluate the fetal heart. Fetal echocardiogram was not scheduled at this time.   We also discussed single gene conditions. They were counseled that an increased NT is associated with an increased chance for specific single gene conditions including Noonan spectrum disorders, skeletal dysplasias, SLOS, CdLS, and many others. We discussed that these conditions are not routinely tested for prenatally unless ultrasound findings or family history significantly increase the suspicion of a specific single gene disorder; however, there is new noninvasive prenatal screening (NIPS) technology which assesses for specific alterations in 30 genes, including the genes associated with Noonan syndrome, some skeletal dysplasias, and CdLS. We discussed that this testing, marketed as Peggy Kelly, is available but requires a maternal blood sample and also a paternal blood or saliva sample. Given that the pregnancy was conceived with donor sperm, this screening is not available in the pregnancy at this time.  They understand that many of the features of these conditions can be detected by detailed ultrasound during the second trimester; however, ultrasound cannot definitively diagnose or rule out these conditions. We briefly reviewed common inheritance patterns (dominant, recessive, and X-linked) as well as the associated risks of recurrence.    Regarding autosomal recessive single gene conditions, [name] reported that she had [facility] provider in the [place] area prior to conception. She was unable to access the report at the time of  today's visit but reported that it was screen negative for all conditions and included cystic fibrosis, spinal muscular atrophy, hemoglobinopathies, and Fragile X syndrome. She was unsure of how many additional conditions may or may not have been included on the screening panel. We reviewed limitations of carrier screening.   Both family histories were reviewed and found to be noncontributory for birth defects, intellectual disability, recurrent pregnancy loss, and known genetic conditions. There was no known consanguinity between the patient and the sperm donor. Without further information regarding the provided family history, an accurate genetic risk cannot be calculated. Further genetic counseling is warranted if more information is obtained.  [name] denied exposure to environmental toxins or chemical agents. She denied the use of alcohol, tobacco or street drugs. She denied significant viral illnesses during the course of her pregnancy.   I counseled this couple for approximately 45 minutes regarding the above risks and available options.   Quinn PlowmanKaren Kyreese Chio, MS,  Certified Genetic Counselor 02/16/2017

## 2017-03-21 ENCOUNTER — Encounter (HOSPITAL_COMMUNITY): Payer: Self-pay

## 2017-03-21 ENCOUNTER — Ambulatory Visit (HOSPITAL_COMMUNITY)
Admission: RE | Admit: 2017-03-21 | Discharge: 2017-03-21 | Disposition: A | Discharge status: Home / Self Care | Payer: Managed Care, Other (non HMO) | Source: Ambulatory Visit | Attending: Obstetrics and Gynecology | Admitting: Obstetrics and Gynecology

## 2017-03-21 ENCOUNTER — Other Ambulatory Visit (HOSPITAL_COMMUNITY): Payer: Self-pay | Admitting: Obstetrics and Gynecology

## 2017-03-21 DIAGNOSIS — O99212 Obesity complicating pregnancy, second trimester: Secondary | ICD-10-CM | POA: Insufficient documentation

## 2017-03-21 DIAGNOSIS — Z3A18 18 weeks gestation of pregnancy: Secondary | ICD-10-CM | POA: Diagnosis not present

## 2017-03-21 DIAGNOSIS — O09812 Supervision of pregnancy resulting from assisted reproductive technology, second trimester: Secondary | ICD-10-CM | POA: Diagnosis not present

## 2017-03-21 DIAGNOSIS — E669 Obesity, unspecified: Secondary | ICD-10-CM | POA: Diagnosis not present

## 2017-03-21 DIAGNOSIS — Z363 Encounter for antenatal screening for malformations: Secondary | ICD-10-CM | POA: Diagnosis present

## 2017-03-21 DIAGNOSIS — O281 Abnormal biochemical finding on antenatal screening of mother: Secondary | ICD-10-CM

## 2017-03-21 DIAGNOSIS — O289 Unspecified abnormal findings on antenatal screening of mother: Secondary | ICD-10-CM | POA: Insufficient documentation

## 2017-03-21 DIAGNOSIS — Z3689 Encounter for other specified antenatal screening: Secondary | ICD-10-CM | POA: Diagnosis present

## 2017-05-30 ENCOUNTER — Telehealth: Payer: Self-pay | Admitting: Medical Oncology

## 2017-05-30 NOTE — Telephone Encounter (Signed)
OB pt-[redacted] weeks pregnant.  Saw OB, Dr Vickey Sages yesterday,  and HGB was 9.5. She wants pt HGB to be 10.5 and asked pt to call Dr Clelia Croft. Pt taking prenatal vitamins.

## 2017-06-02 ENCOUNTER — Other Ambulatory Visit: Payer: Self-pay | Admitting: *Deleted

## 2017-06-02 ENCOUNTER — Telehealth: Payer: Self-pay | Admitting: *Deleted

## 2017-06-02 DIAGNOSIS — D509 Iron deficiency anemia, unspecified: Secondary | ICD-10-CM

## 2017-06-02 NOTE — Telephone Encounter (Signed)
Spoke with patient, scheduling to call with date and time to have labs and see kristin curcio NP

## 2017-06-02 NOTE — Telephone Encounter (Signed)
Pt called back to follow up, Dixie aware. She will check with Dr. Clelia Croft for recommendation.

## 2017-06-05 ENCOUNTER — Telehealth: Payer: Self-pay | Admitting: Oncology

## 2017-06-05 NOTE — Telephone Encounter (Signed)
Scheduled appt per 5/10 sch message - pt is aware of appt date and time   

## 2017-06-09 ENCOUNTER — Encounter: Payer: Self-pay | Admitting: Oncology

## 2017-06-09 ENCOUNTER — Inpatient Hospital Stay: Payer: Managed Care, Other (non HMO) | Attending: Oncology | Admitting: Oncology

## 2017-06-09 ENCOUNTER — Inpatient Hospital Stay: Payer: Managed Care, Other (non HMO)

## 2017-06-09 VITALS — BP 94/79 | HR 100 | Temp 97.9°F | Resp 18 | Ht 64.0 in | Wt 204.0 lb

## 2017-06-09 DIAGNOSIS — O99013 Anemia complicating pregnancy, third trimester: Secondary | ICD-10-CM | POA: Diagnosis not present

## 2017-06-09 DIAGNOSIS — Z3A30 30 weeks gestation of pregnancy: Secondary | ICD-10-CM | POA: Diagnosis not present

## 2017-06-09 DIAGNOSIS — D509 Iron deficiency anemia, unspecified: Secondary | ICD-10-CM

## 2017-06-09 DIAGNOSIS — D500 Iron deficiency anemia secondary to blood loss (chronic): Secondary | ICD-10-CM

## 2017-06-09 LAB — CBC WITH DIFFERENTIAL (CANCER CENTER ONLY)
BASOS ABS: 0 10*3/uL (ref 0.0–0.1)
BASOS PCT: 1 %
EOS ABS: 0.1 10*3/uL (ref 0.0–0.5)
Eosinophils Relative: 1 %
HEMATOCRIT: 28.6 % — AB (ref 34.8–46.6)
HEMOGLOBIN: 9.5 g/dL — AB (ref 11.6–15.9)
Lymphocytes Relative: 19 %
Lymphs Abs: 1.5 10*3/uL (ref 0.9–3.3)
MCH: 27.2 pg (ref 25.1–34.0)
MCHC: 33.1 g/dL (ref 31.5–36.0)
MCV: 82.2 fL (ref 79.5–101.0)
MONOS PCT: 7 %
Monocytes Absolute: 0.6 10*3/uL (ref 0.1–0.9)
NEUTROS ABS: 5.9 10*3/uL (ref 1.5–6.5)
NEUTROS PCT: 72 %
Platelet Count: 290 10*3/uL (ref 145–400)
RBC: 3.49 MIL/uL — AB (ref 3.70–5.45)
RDW: 14.6 % — ABNORMAL HIGH (ref 11.2–14.5)
WBC: 8.1 10*3/uL (ref 3.9–10.3)

## 2017-06-09 LAB — IRON AND TIBC
Iron: 54 ug/dL (ref 41–142)
SATURATION RATIOS: 14 % — AB (ref 21–57)
TIBC: 396 ug/dL (ref 236–444)
UIBC: 343 ug/dL

## 2017-06-09 LAB — FERRITIN: FERRITIN: 21 ng/mL (ref 9–269)

## 2017-06-09 NOTE — Progress Notes (Addendum)
[facility] OFFICE PROGRESS NOTE  Patient, No Pcp Per No address on file  DIAGNOSIS: 27 year old woman with iron deficiency anemia dating back to 2010. Anemia related to menorrhagia.  PRIOR THERAPY: IV iron replacement on multiple occasions.  Most recent dose was on 09/30/2015.  She reports that she does not tolerate oral iron.  CURRENT THERAPY: Observation. Intravenous iron periodically.  INTERVAL HISTORY: Peggy Kelly 27 y.o. female returns for follow-up visit accompanied by her husband.  The patient has not been seen in our office since July 2017.  She has not kept her routine follow-up visits with [place].  The patient contacted our office recently reported that her hemoglobin was down to 9.5 and that she needed to be seen for possible IV iron.  The patient is currently pregnant.  She will be [redacted] weeks gestation tomorrow.  She is on a prenatal vitamin, but no oral iron.  She reports that she has constipation when she takes oral iron.  The patient reports fatigue.  She also reports that she gets cold easily and craves ice.  She denies fevers and chills.  Denies chest pain, shortness of breath, cough, hemoptysis.  Denies nausea, vomiting, constipation, diarrhea.  Denies bleeding.  The patient denies blurry vision, syncope, seizures.  Eyes dizziness.  Denies hematuria, dysuria, and other genitourinary complaints.  Denies skeletal complaints.  The remaining review of systems is unremarkable.  MEDICAL HISTORY: Past Medical History:  Diagnosis Date  . Allergy   . Anemia   . Migraines   . Seasonal allergies     ALLERGIES:  is allergic to penicillins.  MEDICATIONS:  Current Outpatient Medications  Medication Sig Dispense Refill  . Prenatal Vit-Fe Fumarate-FA (PRENATAL VITAMIN PO) Take 1 capsule by mouth daily.    Marland Kitchen ibuprofen (ADVIL,MOTRIN) 200 MG tablet Take 200 mg by mouth every 6 (six) hours as needed.     No current facility-administered medications for this visit.     Facility-Administered Medications Ordered in Other Visits  Medication Dose Route Frequency Provider Last Rate Last Dose  . alteplase (CATHFLO ACTIVASE) injection 2 mg  2 mg Intracatheter Once PRN Benjiman Core, MD      . heparin lock flush 100 unit/mL  500 Units Intracatheter Once PRN Benjiman Core, MD      . heparin lock flush 100 unit/mL  250 Units Intracatheter Once PRN Benjiman Core, MD      . sodium chloride 0.9 % injection 10 mL  10 mL Intracatheter PRN Benjiman Core, MD      . sodium chloride 0.9 % injection 3 mL  3 mL Intravenous Once PRN Benjiman Core, MD        SURGICAL HISTORY:  Past Surgical History:  Procedure Laterality Date  . APPENDECTOMY    . COLPOSCOPY N/A 05/09/2013   Procedure: COLPOSCOPY WITH ECC;  Surgeon: Zelphia Cairo, MD;  Location: [facility];  Service: Gynecology;  Laterality: N/A;  . DILATATION & CURETTAGE/HYSTEROSCOPY WITH TRUECLEAR N/A 05/09/2013   Procedure: DILATATION & CURETTAGE/HYSTEROSCOPY WITH TRUCLEAR;  Surgeon: Zelphia Cairo, MD;  Location: [facility];  Service: Gynecology;  Laterality: N/A;    REVIEW OF SYSTEMS:   Review of Systems  Constitutional: Negative for appetite change, chills, fever. Positive for fatigue. HENT:   Negative for mouth sores, nosebleeds, sore throat and trouble swallowing.   Eyes: Negative for eye problems and icterus.  Respiratory: Negative for cough, hemoptysis, shortness of breath and wheezing.   Cardiovascular: Negative for chest pain and  leg swelling.  Gastrointestinal: Negative for abdominal pain, constipation, diarrhea, nausea and vomiting.  Genitourinary: Negative for bladder incontinence, difficulty urinating, dysuria, frequency and hematuria.   Musculoskeletal: Negative for back pain, gait problem, neck pain and neck stiffness.  Skin: Negative for itching and rash.  Neurological: Negative for dizziness, extremity weakness, gait problem, headaches, light-headedness and seizures.  Hematological: Negative for  adenopathy. Does not bruise/bleed easily. The patient is craving ice.  She gets cold easily. Psychiatric/Behavioral: Negative for confusion, depression and sleep disturbance. The patient is not nervous/anxious.     PHYSICAL EXAMINATION:  Blood pressure 94/79, pulse 100, temperature 97.9 F (36.6 C), temperature source Oral, resp. rate 18, height  (1.626 m), weight 204 lb (92.5 kg), last menstrual period 11/12/2016, SpO2 100 %.  Physical Exam  Constitutional: Oriented to person, place, and time and well-developed, well-nourished, and in no distress. No distress.  HENT:  Head: Normocephalic and atraumatic.  Mouth/Throat: Oropharynx is clear and moist. No oropharyngeal exudate.  Eyes: Conjunctivae are normal. Right eye exhibits no discharge. Left eye exhibits no discharge. No scleral icterus.  Neck: Normal range of motion. Neck supple.  Cardiovascular: Normal rate, regular rhythm, normal heart sounds and intact distal pulses.   Pulmonary/Chest: Effort normal and breath sounds normal. No respiratory distress. No wheezes. No rales.  Abdominal: Soft. Bowel sounds are normal. Exhibits no distension and no mass. There is no tenderness.  Musculoskeletal: Normal range of motion. Exhibits no edema.  Lymphadenopathy:    No cervical adenopathy.  Neurological: Alert and oriented to person, place, and time. Exhibits normal muscle tone. Gait normal. Coordination normal.  Skin: Skin is warm and dry. No rash noted. Not diaphoretic. No erythema. No pallor.  Psychiatric: Mood, memory and judgment normal.  Vitals reviewed.  LABORATORY DATA: Lab Results  Component Value Date   WBC 8.1 06/09/2017   HGB 9.5 (L) 06/09/2017   HCT 28.6 (L) 06/09/2017   MCV 82.2 06/09/2017   PLT 290 06/09/2017      Chemistry      Component Value Date/Time   NA 134 (L) 01/14/2017 0047   NA 141 02/25/2014 1042   K 3.5 01/14/2017 0047   K 3.6 02/25/2014 1042   CL 104 01/14/2017 0047   CO2 23 01/14/2017 0047   CO2  20 (L) 02/25/2014 1042   BUN 6 01/14/2017 0047   BUN 10.3 02/25/2014 1042   CREATININE 0.58 01/14/2017 0047   CREATININE 0.8 02/25/2014 1042      Component Value Date/Time   CALCIUM 9.0 01/14/2017 0047   CALCIUM 8.3 (L) 02/25/2014 1042   ALKPHOS 47 01/14/2017 0047   ALKPHOS 64 02/25/2014 1042   AST 16 01/14/2017 0047   AST 16 02/25/2014 1042   ALT 11 (L) 01/14/2017 0047   ALT 14 02/25/2014 1042   BILITOT 0.2 (L) 01/14/2017 0047   BILITOT 0.45 02/25/2014 1042       RADIOGRAPHIC STUDIES:  No results found.   ASSESSMENT/PLAN:  27 year old woman with the following issues:  1. Iron deficiency anemia: Etiology related to heavy menstrual bleeding that have required IV iron on multiple occasions dating back to 2010.  Last IV iron was given on 09/30/2015.  The patient was then lost to follow-up.  The patient's hemoglobin today is 9.5.  She is mildly symptomatic with fatigue and reports pica.  Her iron studies and ferritin are pending today.  Discussed with the patient that we cannot administer IV iron here at the cancer center due to her pregnancy.  Will await iron studies.  If her ferritin is low, will offer her another formulation of oral iron to see if she tolerates it better.  We discussed with the patient that she could be considered for IV iron ordered and administered by her obstetrician versus consideration of transfusing 1 unit of packed red blood cells as needed.  Will defer this decision to her obstetrician.  2. Menorrhagia: The patient is currently pregnant as not having periods at this time.  She follows up with gynecology regarding this issue.   3. Follow-up: As needed.  Plan reviewed with Dr Clelia Croft.  No orders of the defined types were placed in this encounter.  Clenton Pare, DNP, AGPCNP-BC, AOCNP 06/09/17

## 2017-06-12 ENCOUNTER — Other Ambulatory Visit: Payer: Self-pay | Admitting: Oncology

## 2017-06-12 ENCOUNTER — Telehealth: Payer: Self-pay | Admitting: *Deleted

## 2017-06-12 MED ORDER — FERROUS GLUCONATE 324 (38 FE) MG PO TABS: 324.0000 mg | ORAL_TABLET | Freq: Three times a day (TID) | ORAL | 1 refills | Status: DC

## 2017-06-12 NOTE — Telephone Encounter (Signed)
Per request from Clenton Pare, NP attempted to call patient to advise of new prescription sent to her pharmacy, phone number listed on file: no answer and voicemail full, unable to leave message.  Lm with spouse to have patient return call for further directions.

## 2017-06-13 ENCOUNTER — Other Ambulatory Visit: Payer: Self-pay

## 2017-06-13 MED ORDER — FERROUS GLUCONATE 324 (38 FE) MG PO TABS: 324.0000 mg | ORAL_TABLET | Freq: Three times a day (TID) | ORAL | 1 refills | Status: DC

## 2017-08-03 LAB — OB RESULTS CONSOLE GBS: STREP GROUP B AG: NEGATIVE

## 2017-08-10 ENCOUNTER — Encounter (HOSPITAL_COMMUNITY): Payer: Self-pay | Admitting: *Deleted

## 2017-08-10 ENCOUNTER — Telehealth (HOSPITAL_COMMUNITY): Payer: Self-pay | Admitting: *Deleted

## 2017-08-10 NOTE — Telephone Encounter (Signed)
Preadmission screen  

## 2017-08-18 ENCOUNTER — Encounter (HOSPITAL_COMMUNITY): Payer: Self-pay | Admitting: Emergency Medicine

## 2017-08-18 ENCOUNTER — Other Ambulatory Visit: Payer: Self-pay

## 2017-08-18 ENCOUNTER — Ambulatory Visit (HOSPITAL_COMMUNITY)
Admission: EM | Admit: 2017-08-18 | Discharge: 2017-08-18 | Disposition: A | Discharge status: Home / Self Care | Payer: Managed Care, Other (non HMO) | Attending: Family Medicine | Admitting: Family Medicine

## 2017-08-18 DIAGNOSIS — H60331 Swimmer's ear, right ear: Secondary | ICD-10-CM

## 2017-08-18 MED ORDER — NEOMYCIN-POLYMYXIN-HC 3.5-10000-1 OT SOLN: 3.0000 [drp] | Freq: Four times a day (QID) | OTIC | 0 refills | Status: DC

## 2017-08-18 NOTE — Discharge Instructions (Signed)
Use ear drops as directed Return as needed 

## 2017-08-18 NOTE — ED Provider Notes (Addendum)
[facility]    CSN: [id number] Arrival date & time: 08/18/17  1310     History   Chief Complaint Chief Complaint  Patient presents with  . Otalgia    HPI Peggy Kelly is a 27 y.o. female.   HPI   Patient is pregnant, expecting her first baby tomorrow.  She is here for ear pain.  Right ear only.  Present for 3 days.  Hurts to touch around the ear.  No fever or chills.  No cold symptoms.  No runny or stuffy nose.  Mild underlying allergies.  Her hearing is normal.  She has not been swimming.   Past Medical History:  Diagnosis Date  . Allergy   . Anemia   . Migraines   . Seasonal allergies   . Vaginal Pap smear, abnormal     Patient Active Problem List   Diagnosis Date Noted  . Abnormal finding on antenatal screen 02/16/2017  . [redacted] weeks gestation of pregnancy   . Iron deficiency anemia 02/25/2014    Past Surgical History:  Procedure Laterality Date  . APPENDECTOMY    . COLPOSCOPY N/A 05/09/2013   Procedure: COLPOSCOPY WITH ECC;  Surgeon: Zelphia Cairo, MD;  Location: [facility];  Service: Gynecology;  Laterality: N/A;  . DILATATION & CURETTAGE/HYSTEROSCOPY WITH TRUECLEAR N/A 05/09/2013   Procedure: DILATATION & CURETTAGE/HYSTEROSCOPY WITH TRUCLEAR;  Surgeon: Zelphia Cairo, MD;  Location: [facility];  Service: Gynecology;  Laterality: N/A;  . LEEP      OB History    Gravida  1   Para      Term      Preterm      AB      Living        SAB      TAB      Ectopic      Multiple      Live Births               Home Medications    Prior to Admission medications   Medication Sig Start Date End Date Taking? Authorizing Provider  Prenatal Vit-Fe Fumarate-FA (PRENATAL VITAMIN PO) Take 1 capsule by mouth daily.   Yes [provider]  ibuprofen (ADVIL,MOTRIN) 200 MG tablet Take 200 mg by mouth every 6 (six) hours as needed.    [provider]  neomycin-polymyxin-hydrocortisone (CORTISPORIN) OTIC solution Place 3 drops into  the right ear 4 (four) times daily. 08/18/17   Eustace Moore, MD    Family History Family History  Problem Relation Age of Onset  . Thyroid disease Mother   . Migraines Sister   . Other Sister        anti NMDA receptor encephalitis  . Thyroid disease Maternal Grandmother   . Cancer Maternal Grandfather        prostate    Social History Social History   Tobacco Use  . Smoking status: Never Smoker  . Smokeless tobacco: Never Used  Substance Use Topics  . Alcohol use: Yes    Comment: occassional, 09/23/15 none now  . Drug use: No     Allergies   Penicillins   Review of Systems Review of Systems  Constitutional: Negative for chills and fever.  HENT: Positive for ear pain. Negative for congestion, hearing loss, postnasal drip, rhinorrhea, sinus pressure, sinus pain and sore throat.   Eyes: Negative for pain and visual disturbance.  Respiratory: Negative for cough and shortness of breath.   Cardiovascular: Negative for chest pain and  palpitations.  Gastrointestinal: Negative for abdominal pain and vomiting.       Increased heartburn recently  Genitourinary: Negative for dysuria and hematuria.  Musculoskeletal: Negative for arthralgias and back pain.  Skin: Negative for color change and rash.  Neurological: Negative for seizures and syncope.  All other systems reviewed and are negative.    Physical Exam Triage Vital Signs ED Triage Vitals  Enc Vitals Group     BP 08/18/17 1342 (!) 146/82     Pulse Rate 08/18/17 1342 91     Resp 08/18/17 1342 20     Temp 08/18/17 1342 98.4 F (36.9 C)     Temp Source 08/18/17 1342 Oral     SpO2 08/18/17 1342 100 %     Weight 08/18/17 1344 208 lb (94.3 kg)     Height 08/18/17 1344 5\' 4"  (1.626 m)     Head Circumference --      Peak Flow --      Pain Score 08/18/17 1344 7     Pain Loc --      Pain Edu? --      Excl. in GC? --    No data found.  Updated Vital Signs BP (!) 146/82 (BP Location: Left Arm)   Pulse 91    Temp 98.4 F (36.9 C) (Oral)   Resp 20   Ht 5\' 4"  (1.626 m)   Wt 208 lb (94.3 kg)   LMP 11/12/2016   SpO2 100%   BMI 35.70 kg/m      Physical Exam  Constitutional: She appears well-developed and well-nourished. No distress.  HENT:  Head: Normocephalic and atraumatic.  Left Ear: External ear normal.  Nose: Nose normal.  Mouth/Throat: Oropharynx is clear and moist. No oropharyngeal exudate.  Right external canal is swollen and macerated.  Pain with traction of pinna.  No AC adenopathy.  Eyes: Pupils are equal, round, and reactive to light. Conjunctivae are normal.  Neck: Normal range of motion.  Cardiovascular: Normal rate, regular rhythm and normal heart sounds.  Pulmonary/Chest: Effort normal and breath sounds normal. No respiratory distress.  Abdominal: Soft. She exhibits no distension.  Large gravid abdomen  Musculoskeletal: Normal range of motion. She exhibits no edema.  Neurological: She is alert.  Skin: Skin is warm and dry.     UC Treatments / Results  Labs (all labs ordered are listed, but only abnormal results are displayed) Labs Reviewed - No data to display  EKG None  Radiology No results found.  Procedures Procedures (including critical care time)  Medications Ordered in UC Medications - No data to display  Initial Impression / Assessment and Plan / UC Course  I have reviewed the triage vital signs and the nursing notes.  Pertinent labs & imaging results that were available during my care of the patient were reviewed by me and considered in my medical decision making (see chart for details).    Gave patient a list of medicines that are safe to take during pregnancy.  This includes Tylenol for her ear pain and antiacid medicines for the heartburn Final Clinical Impressions(s) / UC Diagnoses   Final diagnoses:  Acute swimmer's ear of right side     Discharge Instructions     Use ear drops as directed Return as needed    ED Prescriptions      Medication Sig Dispense Auth. Provider   neomycin-polymyxin-hydrocortisone (CORTISPORIN) OTIC solution Place 3 drops into the right ear 4 (four) times daily. 10 mL Eustace MooreNelson, Yvonne Sue,  MD     Controlled Substance Prescriptions [place] Controlled Substance Registry consulted? Not Applicable   Eustace Moore, MD 08/18/17 1409    Eustace Moore, MD 08/18/17 1410

## 2017-08-18 NOTE — ED Triage Notes (Signed)
Patient states she has an ear ache in her right ear.

## 2017-08-20 ENCOUNTER — Inpatient Hospital Stay (HOSPITAL_COMMUNITY): Payer: Managed Care, Other (non HMO) | Admitting: Anesthesiology

## 2017-08-20 ENCOUNTER — Encounter (HOSPITAL_COMMUNITY): Payer: Self-pay

## 2017-08-20 ENCOUNTER — Encounter (HOSPITAL_COMMUNITY)
Admission: AD | Disposition: A | Discharge status: Home / Self Care | Payer: Self-pay | Source: Home / Self Care | Attending: Obstetrics and Gynecology

## 2017-08-20 ENCOUNTER — Inpatient Hospital Stay (HOSPITAL_COMMUNITY)
Admission: AD | Admit: 2017-08-20 | Discharge: 2017-08-23 | DRG: 788 | Disposition: A | Discharge status: Home / Self Care | Payer: Managed Care, Other (non HMO) | Attending: Obstetrics and Gynecology | Admitting: Obstetrics and Gynecology

## 2017-08-20 DIAGNOSIS — Z3A40 40 weeks gestation of pregnancy: Secondary | ICD-10-CM

## 2017-08-20 DIAGNOSIS — Z3483 Encounter for supervision of other normal pregnancy, third trimester: Secondary | ICD-10-CM | POA: Diagnosis present

## 2017-08-20 DIAGNOSIS — O289 Unspecified abnormal findings on antenatal screening of mother: Secondary | ICD-10-CM

## 2017-08-20 DIAGNOSIS — O328XX0 Maternal care for other malpresentation of fetus, not applicable or unspecified: Secondary | ICD-10-CM | POA: Diagnosis present

## 2017-08-20 LAB — CBC
HCT: 30.9 % — ABNORMAL LOW (ref 36.0–46.0)
HEMOGLOBIN: 10.2 g/dL — AB (ref 12.0–15.0)
MCH: 26.3 pg (ref 26.0–34.0)
MCHC: 33 g/dL (ref 30.0–36.0)
MCV: 79.6 fL (ref 78.0–100.0)
Platelets: 251 10*3/uL (ref 150–400)
RBC: 3.88 MIL/uL (ref 3.87–5.11)
RDW: 15.4 % (ref 11.5–15.5)
WBC: 7.6 10*3/uL (ref 4.0–10.5)

## 2017-08-20 LAB — COMPREHENSIVE METABOLIC PANEL
ALBUMIN: 2.9 g/dL — AB (ref 3.5–5.0)
ALK PHOS: 80 U/L (ref 38–126)
ALT: 12 U/L (ref 0–44)
ANION GAP: 12 (ref 5–15)
AST: 20 U/L (ref 15–41)
BILIRUBIN TOTAL: 0.3 mg/dL (ref 0.3–1.2)
BUN: 7 mg/dL (ref 6–20)
CALCIUM: 8.6 mg/dL — AB (ref 8.9–10.3)
CO2: 17 mmol/L — AB (ref 22–32)
Chloride: 104 mmol/L (ref 98–111)
Creatinine, Ser: 0.52 mg/dL (ref 0.44–1.00)
GFR calc Af Amer: >60 mL/min (ref >60)
GFR calc non Af Amer: >60 mL/min (ref >60)
GLUCOSE: 90 mg/dL (ref 70–99)
POTASSIUM: 3.7 mmol/L (ref 3.5–5.1)
Sodium: 133 mmol/L — ABNORMAL LOW (ref 135–145)
TOTAL PROTEIN: 6.5 g/dL (ref 6.5–8.1)

## 2017-08-20 LAB — TYPE AND SCREEN
ABO/RH(D): A POS
Antibody Screen: NEGATIVE

## 2017-08-20 LAB — PROTEIN / CREATININE RATIO, URINE
CREATININE, URINE: 266 mg/dL
Protein Creatinine Ratio: 0.12 mg/mg{Cre} (ref 0.00–0.15)
TOTAL PROTEIN, URINE: 32 mg/dL

## 2017-08-20 LAB — ABO/RH: ABO/RH(D): A POS

## 2017-08-20 SURGERY — Surgical Case
Anesthesia: Spinal

## 2017-08-20 MED ORDER — METOCLOPRAMIDE HCL 5 MG/ML IJ SOLN
INTRAMUSCULAR | Status: AC
  Filled 2017-08-20: qty 2

## 2017-08-20 MED ORDER — KETOROLAC TROMETHAMINE 30 MG/ML IJ SOLN
30.0000 mg | Freq: Once | INTRAMUSCULAR | Status: AC | PRN
  Administered 2017-08-20: 30 mg via INTRAVENOUS

## 2017-08-20 MED ORDER — ONDANSETRON HCL 4 MG/2ML IJ SOLN
INTRAMUSCULAR | Status: DC | PRN
  Administered 2017-08-20: 4 mg via INTRAVENOUS

## 2017-08-20 MED ORDER — LACTATED RINGERS IV SOLN
INTRAVENOUS | Status: DC
  Administered 2017-08-20 – 2017-08-21 (×2): via INTRAVENOUS

## 2017-08-20 MED ORDER — HYDROMORPHONE HCL 1 MG/ML IJ SOLN
INTRAMUSCULAR | Status: AC
  Filled 2017-08-20: qty 0.5

## 2017-08-20 MED ORDER — FENTANYL 2.5 MCG/ML BUPIVACAINE 1/10 % EPIDURAL INFUSION (WH - ANES): 14.0000 mL/h | INTRAMUSCULAR | Status: DC | PRN

## 2017-08-20 MED ORDER — DIPHENHYDRAMINE HCL 25 MG PO CAPS
25.0000 mg | ORAL_CAPSULE | Freq: Four times a day (QID) | ORAL | Status: DC | PRN
  Administered 2017-08-21: 25 mg via ORAL
  Filled 2017-08-20: qty 1

## 2017-08-20 MED ORDER — DIPHENHYDRAMINE HCL 25 MG PO CAPS
25.0000 mg | ORAL_CAPSULE | ORAL | Status: DC | PRN
  Administered 2017-08-21: 25 mg via ORAL
  Filled 2017-08-20: qty 1

## 2017-08-20 MED ORDER — SCOPOLAMINE 1 MG/3DAYS TD PT72
MEDICATED_PATCH | TRANSDERMAL | Status: DC | PRN
  Administered 2017-08-20: 1 via TRANSDERMAL

## 2017-08-20 MED ORDER — SIMETHICONE 80 MG PO CHEW
80.0000 mg | CHEWABLE_TABLET | Freq: Three times a day (TID) | ORAL | Status: DC
  Administered 2017-08-21 – 2017-08-23 (×4): 80 mg via ORAL
  Filled 2017-08-20 (×5): qty 1

## 2017-08-20 MED ORDER — BUPIVACAINE IN DEXTROSE 0.75-8.25 % IT SOLN
INTRATHECAL | Status: AC
  Filled 2017-08-20: qty 2

## 2017-08-20 MED ORDER — OXYCODONE-ACETAMINOPHEN 5-325 MG PO TABS: 1.0000 | ORAL_TABLET | ORAL | Status: DC | PRN

## 2017-08-20 MED ORDER — SIMETHICONE 80 MG PO CHEW
80.0000 mg | CHEWABLE_TABLET | ORAL | Status: DC
  Administered 2017-08-21 – 2017-08-23 (×3): 80 mg via ORAL
  Filled 2017-08-20 (×3): qty 1

## 2017-08-20 MED ORDER — ONDANSETRON HCL 4 MG/2ML IJ SOLN: 4.0000 mg | Freq: Three times a day (TID) | INTRAMUSCULAR | Status: DC | PRN

## 2017-08-20 MED ORDER — SCOPOLAMINE 1 MG/3DAYS TD PT72
MEDICATED_PATCH | TRANSDERMAL | Status: AC
  Filled 2017-08-20: qty 1

## 2017-08-20 MED ORDER — LACTATED RINGERS IV SOLN: 500.0000 mL | Freq: Once | INTRAVENOUS | Status: DC

## 2017-08-20 MED ORDER — ACETAMINOPHEN 325 MG PO TABS
650.0000 mg | ORAL_TABLET | ORAL | Status: DC | PRN
  Administered 2017-08-21 – 2017-08-23 (×4): 650 mg via ORAL
  Filled 2017-08-20 (×4): qty 2

## 2017-08-20 MED ORDER — PHENYLEPHRINE 40 MCG/ML (10ML) SYRINGE FOR IV PUSH (FOR BLOOD PRESSURE SUPPORT): 80.0000 ug | PREFILLED_SYRINGE | INTRAVENOUS | Status: DC | PRN

## 2017-08-20 MED ORDER — IBUPROFEN 600 MG PO TABS
600.0000 mg | ORAL_TABLET | Freq: Four times a day (QID) | ORAL | Status: DC
  Administered 2017-08-21 – 2017-08-23 (×8): 600 mg via ORAL
  Filled 2017-08-20 (×10): qty 1

## 2017-08-20 MED ORDER — SODIUM CHLORIDE 0.9 % IR SOLN
Status: DC | PRN
  Administered 2017-08-20: 1000 mL

## 2017-08-20 MED ORDER — SIMETHICONE 80 MG PO CHEW: 80.0000 mg | CHEWABLE_TABLET | ORAL | Status: DC | PRN

## 2017-08-20 MED ORDER — SOD CITRATE-CITRIC ACID 500-334 MG/5ML PO SOLN
30.0000 mL | ORAL | Status: DC | PRN
  Administered 2017-08-20: 30 mL via ORAL
  Filled 2017-08-20: qty 15

## 2017-08-20 MED ORDER — OXYCODONE-ACETAMINOPHEN 5-325 MG PO TABS: 2.0000 | ORAL_TABLET | ORAL | Status: DC | PRN

## 2017-08-20 MED ORDER — NALOXONE HCL 0.4 MG/ML IJ SOLN: 0.4000 mg | INTRAMUSCULAR | Status: DC | PRN

## 2017-08-20 MED ORDER — SODIUM CHLORIDE 0.9 % IV SOLN
2.0000 g | Freq: Once | INTRAVENOUS | Status: AC
  Administered 2017-08-20: 2 g via INTRAVENOUS
  Filled 2017-08-20: qty 2

## 2017-08-20 MED ORDER — ONDANSETRON HCL 4 MG/2ML IJ SOLN: 4.0000 mg | Freq: Four times a day (QID) | INTRAMUSCULAR | Status: DC | PRN

## 2017-08-20 MED ORDER — MORPHINE SULFATE (PF) 0.5 MG/ML IJ SOLN
INTRAMUSCULAR | Status: DC | PRN
  Administered 2017-08-20: .2 mg via INTRATHECAL

## 2017-08-20 MED ORDER — OXYTOCIN BOLUS FROM INFUSION: 500.0000 mL | Freq: Once | INTRAVENOUS | Status: DC

## 2017-08-20 MED ORDER — MEASLES, MUMPS & RUBELLA VAC ~~LOC~~ INJ
0.5000 mL | INJECTION | Freq: Once | SUBCUTANEOUS | Status: DC
  Filled 2017-08-20: qty 0.5

## 2017-08-20 MED ORDER — FENTANYL CITRATE (PF) 100 MCG/2ML IJ SOLN
INTRAMUSCULAR | Status: AC
  Filled 2017-08-20: qty 2

## 2017-08-20 MED ORDER — DEXAMETHASONE SODIUM PHOSPHATE 4 MG/ML IJ SOLN
INTRAMUSCULAR | Status: AC
  Filled 2017-08-20: qty 1

## 2017-08-20 MED ORDER — DEXAMETHASONE SODIUM PHOSPHATE 4 MG/ML IJ SOLN
INTRAMUSCULAR | Status: DC | PRN
  Administered 2017-08-20: 4 mg via INTRAVENOUS

## 2017-08-20 MED ORDER — HYDROMORPHONE HCL 1 MG/ML IJ SOLN
INTRAMUSCULAR | Status: AC
  Administered 2017-08-20: 0.25 mg via INTRAVENOUS
  Filled 2017-08-20: qty 0.5

## 2017-08-20 MED ORDER — FLEET ENEMA 7-19 GM/118ML RE ENEM: 1.0000 | ENEMA | RECTAL | Status: DC | PRN

## 2017-08-20 MED ORDER — PRENATAL MULTIVITAMIN CH
1.0000 | ORAL_TABLET | Freq: Every day | ORAL | Status: DC
  Administered 2017-08-21 – 2017-08-22 (×2): 1 via ORAL
  Filled 2017-08-20 (×3): qty 1

## 2017-08-20 MED ORDER — MEPERIDINE HCL 25 MG/ML IJ SOLN: 6.2500 mg | INTRAMUSCULAR | Status: DC | PRN

## 2017-08-20 MED ORDER — OXYCODONE HCL 5 MG PO TABS: 10.0000 mg | ORAL_TABLET | ORAL | Status: DC | PRN

## 2017-08-20 MED ORDER — PHENYLEPHRINE 40 MCG/ML (10ML) SYRINGE FOR IV PUSH (FOR BLOOD PRESSURE SUPPORT)
PREFILLED_SYRINGE | INTRAVENOUS | Status: AC
  Filled 2017-08-20: qty 10

## 2017-08-20 MED ORDER — OXYTOCIN 40 UNITS IN LACTATED RINGERS INFUSION - SIMPLE MED: 2.5000 [IU]/h | INTRAVENOUS | Status: AC

## 2017-08-20 MED ORDER — OXYTOCIN 40 UNITS IN LACTATED RINGERS INFUSION - SIMPLE MED: 2.5000 [IU]/h | INTRAVENOUS | Status: DC

## 2017-08-20 MED ORDER — OXYTOCIN 10 UNIT/ML IJ SOLN
INTRAVENOUS | Status: DC | PRN
  Administered 2017-08-20: 40 [IU] via INTRAVENOUS

## 2017-08-20 MED ORDER — OXYCODONE HCL 5 MG PO TABS
5.0000 mg | ORAL_TABLET | ORAL | Status: DC | PRN
  Administered 2017-08-21 – 2017-08-23 (×4): 5 mg via ORAL
  Filled 2017-08-20 (×6): qty 1

## 2017-08-20 MED ORDER — ACETAMINOPHEN 500 MG PO TABS
1000.0000 mg | ORAL_TABLET | Freq: Four times a day (QID) | ORAL | Status: AC
  Administered 2017-08-20 – 2017-08-21 (×4): 1000 mg via ORAL
  Filled 2017-08-20 (×4): qty 2

## 2017-08-20 MED ORDER — KETOROLAC TROMETHAMINE 30 MG/ML IJ SOLN: 30.0000 mg | Freq: Four times a day (QID) | INTRAMUSCULAR | Status: AC | PRN

## 2017-08-20 MED ORDER — EPHEDRINE 5 MG/ML INJ: 10.0000 mg | INTRAVENOUS | Status: DC | PRN

## 2017-08-20 MED ORDER — DIBUCAINE 1 % RE OINT: 1.0000 "application " | TOPICAL_OINTMENT | RECTAL | Status: DC | PRN

## 2017-08-20 MED ORDER — ACETAMINOPHEN 325 MG PO TABS: 650.0000 mg | ORAL_TABLET | ORAL | Status: DC | PRN

## 2017-08-20 MED ORDER — SCOPOLAMINE 1 MG/3DAYS TD PT72
1.0000 | MEDICATED_PATCH | Freq: Once | TRANSDERMAL | Status: DC
  Filled 2017-08-20: qty 1

## 2017-08-20 MED ORDER — ONDANSETRON HCL 4 MG/2ML IJ SOLN
INTRAMUSCULAR | Status: AC
  Filled 2017-08-20: qty 2

## 2017-08-20 MED ORDER — WITCH HAZEL-GLYCERIN EX PADS: 1.0000 "application " | MEDICATED_PAD | CUTANEOUS | Status: DC | PRN

## 2017-08-20 MED ORDER — COCONUT OIL OIL
1.0000 "application " | TOPICAL_OIL | Status: DC | PRN
  Administered 2017-08-22: 1 via TOPICAL
  Filled 2017-08-20: qty 120

## 2017-08-20 MED ORDER — NALBUPHINE HCL 10 MG/ML IJ SOLN: 5.0000 mg | INTRAMUSCULAR | Status: DC | PRN

## 2017-08-20 MED ORDER — SODIUM CHLORIDE 0.9% FLUSH
INTRAVENOUS | Status: AC
  Filled 2017-08-20: qty 3

## 2017-08-20 MED ORDER — LIDOCAINE HCL (PF) 1 % IJ SOLN: 30.0000 mL | INTRAMUSCULAR | Status: DC | PRN

## 2017-08-20 MED ORDER — OXYTOCIN 10 UNIT/ML IJ SOLN
INTRAMUSCULAR | Status: AC
  Filled 2017-08-20: qty 4

## 2017-08-20 MED ORDER — TETANUS-DIPHTH-ACELL PERTUSSIS 5-2.5-18.5 LF-MCG/0.5 IM SUSP: 0.5000 mL | Freq: Once | INTRAMUSCULAR | Status: DC

## 2017-08-20 MED ORDER — METOCLOPRAMIDE HCL 5 MG/ML IJ SOLN
INTRAMUSCULAR | Status: DC | PRN
  Administered 2017-08-20: 10 mg via INTRAVENOUS

## 2017-08-20 MED ORDER — FENTANYL CITRATE (PF) 100 MCG/2ML IJ SOLN
INTRAMUSCULAR | Status: DC | PRN
  Administered 2017-08-20: 20 ug via INTRATHECAL

## 2017-08-20 MED ORDER — MENTHOL 3 MG MT LOZG: 1.0000 | LOZENGE | OROMUCOSAL | Status: DC | PRN

## 2017-08-20 MED ORDER — KETOROLAC TROMETHAMINE 30 MG/ML IJ SOLN
30.0000 mg | Freq: Four times a day (QID) | INTRAMUSCULAR | Status: AC | PRN
  Administered 2017-08-21 (×2): 30 mg via INTRAVENOUS
  Filled 2017-08-20 (×2): qty 1

## 2017-08-20 MED ORDER — SODIUM CHLORIDE 0.9% FLUSH: 3.0000 mL | INTRAVENOUS | Status: DC | PRN

## 2017-08-20 MED ORDER — DIPHENHYDRAMINE HCL 50 MG/ML IJ SOLN
12.5000 mg | INTRAMUSCULAR | Status: DC | PRN
  Administered 2017-08-20 – 2017-08-21 (×2): 12.5 mg via INTRAVENOUS
  Filled 2017-08-20 (×2): qty 1

## 2017-08-20 MED ORDER — MORPHINE SULFATE (PF) 0.5 MG/ML IJ SOLN
INTRAMUSCULAR | Status: AC
  Filled 2017-08-20: qty 10

## 2017-08-20 MED ORDER — LACTATED RINGERS IV SOLN
INTRAVENOUS | Status: DC
  Administered 2017-08-20 (×4): via INTRAVENOUS

## 2017-08-20 MED ORDER — NALBUPHINE HCL 10 MG/ML IJ SOLN: 5.0000 mg | Freq: Once | INTRAMUSCULAR | Status: DC | PRN

## 2017-08-20 MED ORDER — LACTATED RINGERS IV SOLN: 500.0000 mL | INTRAVENOUS | Status: DC | PRN

## 2017-08-20 MED ORDER — HYDROMORPHONE HCL 1 MG/ML IJ SOLN
0.2500 mg | INTRAMUSCULAR | Status: DC | PRN
  Administered 2017-08-20: 0.25 mg via INTRAVENOUS
  Administered 2017-08-20: 0.5 mg via INTRAVENOUS
  Administered 2017-08-20: 0.25 mg via INTRAVENOUS

## 2017-08-20 MED ORDER — PROMETHAZINE HCL 25 MG/ML IJ SOLN: 6.2500 mg | INTRAMUSCULAR | Status: DC | PRN

## 2017-08-20 MED ORDER — SENNOSIDES-DOCUSATE SODIUM 8.6-50 MG PO TABS
2.0000 | ORAL_TABLET | ORAL | Status: DC
  Administered 2017-08-21 – 2017-08-23 (×3): 2 via ORAL
  Filled 2017-08-20 (×3): qty 2

## 2017-08-20 MED ORDER — KETOROLAC TROMETHAMINE 30 MG/ML IJ SOLN
INTRAMUSCULAR | Status: AC
  Administered 2017-08-20: 30 mg via INTRAVENOUS
  Filled 2017-08-20: qty 1

## 2017-08-20 MED ORDER — LACTATED RINGERS IV SOLN
INTRAVENOUS | Status: DC | PRN
  Administered 2017-08-20: 16:00:00 via INTRAVENOUS

## 2017-08-20 MED ORDER — DIPHENHYDRAMINE HCL 50 MG/ML IJ SOLN: 12.5000 mg | INTRAMUSCULAR | Status: DC | PRN

## 2017-08-20 MED ORDER — NALOXONE HCL 4 MG/10ML IJ SOLN
1.0000 ug/kg/h | INTRAVENOUS | Status: DC | PRN
  Filled 2017-08-20: qty 5

## 2017-08-20 MED ORDER — MEPERIDINE HCL 25 MG/ML IJ SOLN
6.2500 mg | INTRAMUSCULAR | Status: DC | PRN
Start: 2017-08-20 — End: 2017-08-20

## 2017-08-20 MED ORDER — OXYTOCIN 40 UNITS IN LACTATED RINGERS INFUSION - SIMPLE MED
1.0000 m[IU]/min | INTRAVENOUS | Status: DC
  Administered 2017-08-20: 2 m[IU]/min via INTRAVENOUS
  Filled 2017-08-20: qty 1000

## 2017-08-20 MED ORDER — TERBUTALINE SULFATE 1 MG/ML IJ SOLN: 0.2500 mg | Freq: Once | INTRAMUSCULAR | Status: DC | PRN

## 2017-08-20 MED ORDER — PHENYLEPHRINE 8 MG IN D5W 100 ML (0.08MG/ML) PREMIX OPTIME
INJECTION | INTRAVENOUS | Status: DC | PRN
  Administered 2017-08-20: 60 ug/min via INTRAVENOUS

## 2017-08-20 MED ORDER — PHENYLEPHRINE 8 MG IN D5W 100 ML (0.08MG/ML) PREMIX OPTIME
INJECTION | INTRAVENOUS | Status: AC
  Filled 2017-08-20: qty 100

## 2017-08-20 MED ORDER — IBUPROFEN 600 MG PO TABS: 600.0000 mg | ORAL_TABLET | Freq: Four times a day (QID) | ORAL | Status: DC

## 2017-08-20 MED ORDER — BUPIVACAINE IN DEXTROSE 0.75-8.25 % IT SOLN
INTRATHECAL | Status: DC | PRN
  Administered 2017-08-20: 1.4 mL via INTRATHECAL

## 2017-08-20 MED ORDER — ZOLPIDEM TARTRATE 5 MG PO TABS: 5.0000 mg | ORAL_TABLET | Freq: Every evening | ORAL | Status: DC | PRN

## 2017-08-20 SURGICAL SUPPLY — 29 items
CHLORAPREP W/TINT 26ML (MISCELLANEOUS) ×3 IMPLANT
CLAMP CORD UMBIL (MISCELLANEOUS) IMPLANT
CLOTH BEACON ORANGE TIMEOUT ST (SAFETY) ×3 IMPLANT
DRSG OPSITE POSTOP 4X10 (GAUZE/BANDAGES/DRESSINGS) ×3 IMPLANT
ELECT REM PT RETURN 9FT ADLT (ELECTROSURGICAL) ×3
ELECTRODE REM PT RTRN 9FT ADLT (ELECTROSURGICAL) ×1 IMPLANT
EXTRACTOR VACUUM M CUP 4 TUBE (SUCTIONS) IMPLANT
EXTRACTOR VACUUM M CUP 4' TUBE (SUCTIONS)
GLOVE BIOGEL PI IND STRL 7.0 (GLOVE) ×1 IMPLANT
GLOVE BIOGEL PI INDICATOR 7.0 (GLOVE) ×2
GLOVE SURG ORTHO 8.0 STRL STRW (GLOVE) ×3 IMPLANT
GOWN STRL REUS W/TWL LRG LVL3 (GOWN DISPOSABLE) ×6 IMPLANT
KIT ABG SYR 3ML LUER SLIP (SYRINGE) ×3 IMPLANT
NEEDLE HYPO 25X5/8 SAFETYGLIDE (NEEDLE) ×3 IMPLANT
NS IRRIG 1000ML POUR BTL (IV SOLUTION) ×3 IMPLANT
PACK C SECTION WH (CUSTOM PROCEDURE TRAY) ×3 IMPLANT
PAD OB MATERNITY 4.3X12.25 (PERSONAL CARE ITEMS) ×3 IMPLANT
PENCIL SMOKE EVAC W/HOLSTER (ELECTROSURGICAL) ×3 IMPLANT
RETRACTOR WND ALEXIS 25 LRG (MISCELLANEOUS) ×1 IMPLANT
RTRCTR WOUND ALEXIS 25CM LRG (MISCELLANEOUS) ×3
SUT MNCRL 0 VIOLET CTX 36 (SUTURE) ×3 IMPLANT
SUT MON AB 4-0 PS1 27 (SUTURE) ×3 IMPLANT
SUT MONOCRYL 0 CTX 36 (SUTURE) ×6
SUT PDS AB 1 CT  36 (SUTURE)
SUT PDS AB 1 CT 36 (SUTURE) IMPLANT
SUT VIC AB 1 CTX 36 (SUTURE)
SUT VIC AB 1 CTX36XBRD ANBCTRL (SUTURE) IMPLANT
TOWEL OR 17X24 6PK STRL BLUE (TOWEL DISPOSABLE) ×3 IMPLANT
TRAY FOLEY W/BAG SLVR 14FR LF (SET/KITS/TRAYS/PACK) ×3 IMPLANT

## 2017-08-20 NOTE — Progress Notes (Addendum)
Patient ID: Peggy Kelly, female   DOB: April 26, 1990, 27 y.o.   MRN: [id number] CTSP for repetitive variables with each ctx RN checked cx and palpated presenting cord and this was confirmed by the charge nurse Cx 0.5/60/high with Funic presentation   SROM/Term/ Funic presentation Plan primary C/S Risks and benefits of C/S were discussed.  All questions were answered and informed consent was obtained.  Plan to proceed with low segment transverse Cesarean Section. Pt and her family report that she can take Amoxicillin without problems (lists PCN as allergy-rash) DL

## 2017-08-20 NOTE — Anesthesia Pain Management Evaluation Note (Signed)
  CRNA Pain Management Visit Note  Patient: Peggy PlattShontia R Mennenga, 27 y.o., female  "Hello I am a [facility]. We have an anesthesia team available at all times to provide care throughout the hospital, including epidural management and anesthesia for C-section. I don't know your plan for the delivery whether it a natural birth, water birth, IV sedation, nitrous supplementation, doula or epidural, but we want to meet your pain goals."   1.Was your pain managed to your expectations on prior hospitalizations?   No prior hospitalizations  2.What is your expectation for pain management during this hospitalization?     Epidural  3.How can we help you reach that goal? Epidural at pain goal.  Record the patient's initial score and the patient's pain goal.   Pain: 6  Pain Goal: 8 [facility] wants you to be able to say your pain was always managed very well.  Shayde Gervacio 08/20/2017

## 2017-08-20 NOTE — H&P (Signed)
Peggy Kelly is a 27 y.o. female presenting for SROM and early labor sxs.  Pregnancy complicated by increase Tri21 risk on 1st trimester screen.  Gen counseling/US/ Echo performed and patient elected not to have NIPS (sperm donor and early twin demise).  Denies PIH sxs.  GBS neg. OB History    Gravida  1   Para      Term      Preterm      AB      Living        SAB      TAB      Ectopic      Multiple      Live Births             Past Medical History:  Diagnosis Date  . Allergy   . Anemia   . Migraines   . Seasonal allergies   . Vaginal Pap smear, abnormal    Past Surgical History:  Procedure Laterality Date  . APPENDECTOMY    . COLPOSCOPY N/A 05/09/2013   Procedure: COLPOSCOPY WITH ECC;  Surgeon: Peggy CairoGretchen Adkins, MD;  Location: [facility];  Service: Gynecology;  Laterality: N/A;  . DILATATION & CURETTAGE/HYSTEROSCOPY WITH TRUECLEAR N/A 05/09/2013   Procedure: DILATATION & CURETTAGE/HYSTEROSCOPY WITH TRUCLEAR;  Surgeon: Peggy CairoGretchen Adkins, MD;  Location: [facility];  Service: Gynecology;  Laterality: N/A;  . LEEP     Family History: family history includes Cancer in her maternal grandfather; Migraines in her sister; Other in her sister; Thyroid disease in her maternal grandmother and mother. Social History:  reports that she has never smoked. She has never used smokeless tobacco. She reports that she drinks alcohol. She reports that she does not use drugs.     Maternal Diabetes: No Genetic Screening: Normal Maternal Ultrasounds/Referrals: Normal Fetal Ultrasounds or other Referrals:  None Maternal Substance Abuse:  No Significant Maternal Medications:  None Significant Maternal Lab Results:  None Other Comments:  None  ROS History Dilation: Fingertip Effacement (%): 60 Station: -3 Exam by:: Peggy Kelly Blood pressure (!) 138/91, pulse 81, temperature 98 F (36.7 C), temperature source Oral, resp. rate 18, height 5\' 4"  (1.626 m), weight 209 lb (94.8  kg), last menstrual period 11/12/2016. Exam Physical Exam  Prenatal labs: ABO, Rh: --/--/A POS (07/28 0850) Antibody: NEG (07/28 0850) Rubella:   RPR: Nonreactive (12/20 0000)  HBsAg: Negative (12/20 0000)  HIV: Non-reactive (12/20 0000)  GBS: Negative (07/11 0000)   Assessment/Plan: IUP at term SROM and early labor.  Anticipate SVD.  Augment prn Elevated BPs.  Normal labs.  No severe features   Peggy Kelly C 08/20/2017, 11:14 AM

## 2017-08-20 NOTE — Anesthesia Postprocedure Evaluation (Signed)
Anesthesia Post Note  Patient: Leane PlattShontia R Bonaventure  Procedure(s) Performed: CESAREAN SECTION (N/A )     Patient location during evaluation: PACU Anesthesia Type: Spinal Level of consciousness: awake Pain management: pain level controlled Vital Signs Assessment: post-procedure vital signs reviewed and stable Respiratory status: spontaneous breathing Cardiovascular status: stable Postop Assessment: no headache, no backache, spinal receding, patient able to bend at knees and no apparent nausea or vomiting Anesthetic complications: no    Last Vitals:  Vitals:   08/20/17 1830 08/20/17 1930  BP: (!) 135/96 131/89  Pulse: 84 87  Resp: 18 18  Temp: (!) 36.1 C   SpO2: 100% 99%    Last Pain:  Vitals:   08/20/17 1930  TempSrc:   PainSc: 6    Pain Goal: Patients Stated Pain Goal: 8 (08/20/17 1100)               Keiran Gaffey JR,JOHN Susann GivensFRANKLIN

## 2017-08-20 NOTE — Progress Notes (Signed)
MOB has flat nipples, baby is having trouble obtaining a deep latch.  Provided MOB a hand pump to try amd pull out nipple by pre pumping before feedings.  MOB is hesitant to use nipple shields and wants to try the hand pump first. Recommended shells for her, none available in the stock room.  Will refer to lactation when they come in at 2300.

## 2017-08-20 NOTE — Transfer of Care (Signed)
Immediate Anesthesia Transfer of Care Note  Patient: Peggy PlattShontia R Kelly  Procedure(s) Performed: CESAREAN SECTION (N/A )  Patient Location: PACU  Anesthesia Type:Spinal  Level of Consciousness: awake, alert  and oriented  Airway & Oxygen Therapy: Patient Spontanous Breathing  Post-op Assessment: Report given to RN and Post -op Vital signs reviewed and stable  Post vital signs: Reviewed and stable  Last Vitals:  Vitals Value Taken Time  BP 122/77 08/20/2017  4:46 PM  Temp    Pulse 76 08/20/2017  4:50 PM  Resp 14 08/20/2017  4:50 PM  SpO2 99 % 08/20/2017  4:50 PM  Vitals shown include unvalidated device data.  Last Pain:  Vitals:   08/20/17 1520  TempSrc: Oral  PainSc:       Patients Stated Pain Goal: 8 (08/20/17 1100)  Complications: No apparent anesthesia complications

## 2017-08-20 NOTE — Progress Notes (Signed)
Pt informed that the ultrasound is considered a limited OB ultrasound and is not intended to be a complete ultrasound exam.  Patient also informed that the ultrasound is not being completed with the intent of assessing for fetal or placental anomalies or any pelvic abnormalities.  Explained that the purpose of today's ultrasound is to assess for  presentation.  Patient acknowledges the purpose of the exam and the limitations of the study.  Vertex   Peggy Smithey, NP    

## 2017-08-20 NOTE — MAU Note (Signed)
Reports LOF around 0320 and is still leaking, ctx started with ROM. Some bloody show, unsure about FM

## 2017-08-20 NOTE — Op Note (Signed)
Cesarean Section Procedure Note  Pre-operative Diagnosis: IUP at 40 weeks, SROM, funic presentation  Post-operative Diagnosis: same  Surgeon: Turner DanielsLOWE,Bence Trapp C   Assistants: none  Anesthesia: spinal  Procedure:  Low Segment Transverse cesarean section  Procedure Details  The patient was seen in the Holding Room. The risks, benefits, complications, treatment options, and expected outcomes were discussed with the patient.  The patient concurred with the proposed plan, giving informed consent.  The site of surgery properly noted/marked.. A Time Out was held and the above information confirmed.  After induction of anesthesia, the patient was draped and prepped in the usual sterile manner. A Pfannenstiel incision was made and carried down through the subcutaneous tissue to the fascia. Fascial incision was made and extended transversely. The fascia was separated from the underlying rectus tissue superiorly and inferiorly. The peritoneum was identified and entered. Peritoneal incision was extended longitudinally. The utero-vesical peritoneal reflection was incised transversely and the bladder flap was bluntly freed from the lower uterine segment. A low transverse uterine incision was made. Delivered from vertex presentation was a baby with Apgar scores of 9 at one minute and 9 at five minutes. After the umbilical cord was clamped and cut cord blood was obtained for evaluation. The placenta was removed intact and appeared normal. The uterine outline, tubes and ovaries appeared normal. The uterine incision was closed with running locked sutures of 0 monocryl and imbricated with 0 monocryl. Hemostasis was observed. Lavage was carried out until clear. The peritoneum was then closed with 0 monocryl and rectus muscles plicated in the midline.  After hemostasis was assured, the fascia was then reapproximated with running sutures of 0 Vicryl. Irrigation was applied and after adequate hemostasis was assured, the skin  was reapproximated with subcutaneous sutures using 4-0 monocryl.  Instrument, sponge, and needle counts were correct prior the abdominal closure and at the conclusion of the case. The patient received 2 grams cefotetan preoperatively.  Findings: Viable female  Estimated Blood Loss:  1071cc         Specimens: Placenta was sent to labor and delivery         Complications:  None

## 2017-08-20 NOTE — Anesthesia Procedure Notes (Signed)
Spinal  Start time: 08/20/2017 3:27 PM End time: 08/20/2017 3:31 PM Staffing Anesthesiologist: Peggy Kelly, Peggy Allerton, MD Performed: anesthesiologist  Preanesthetic Checklist Completed: patient identified, site marked, surgical consent, pre-op evaluation, timeout performed, IV checked, risks and benefits discussed and monitors and equipment checked Spinal Block Patient position: sitting Prep: site prepped and draped and DuraPrep Patient monitoring: continuous pulse ox and blood pressure Location: L3-4 Injection technique: single-shot Needle Needle type: Pencan  Needle gauge: 24 G Needle insertion depth: 7 cm Assessment Sensory level: T4

## 2017-08-20 NOTE — Anesthesia Preprocedure Evaluation (Addendum)
Anesthesia Evaluation  Patient identified by MRN, date of birth, ID band Patient awake    Reviewed: Allergy & Precautions, H&P , NPO status , Patient's Chart, lab work & pertinent test results  Airway Mallampati: II  TM Distance: >3 FB Neck ROM: full    Dental no notable dental hx. (+) Teeth Intact   Pulmonary neg pulmonary ROS,    Pulmonary exam normal breath sounds clear to auscultation       Cardiovascular negative cardio ROS Normal cardiovascular exam Rhythm:regular Rate:Normal     Neuro/Psych negative psych ROS   GI/Hepatic negative GI ROS, Neg liver ROS,   Endo/Other  negative endocrine ROS  Renal/GU negative Renal ROS     Musculoskeletal   Abdominal (+) + obese,   Peds  Hematology   Anesthesia Other Findings   Reproductive/Obstetrics (+) Pregnancy                             Anesthesia Physical Anesthesia Plan  ASA: II  Anesthesia Plan: Spinal   Post-op Pain Management:    Induction:   PONV Risk Score and Plan: 3 and Ondansetron, Dexamethasone and Scopolamine patch - Pre-op  Airway Management Planned: Natural Airway and Nasal Cannula  Additional Equipment:   Intra-op Plan:   Post-operative Plan:   Informed Consent: I have reviewed the patients History and Physical, chart, labs and discussed the procedure including the risks, benefits and alternatives for the proposed anesthesia with the patient or authorized representative who has indicated his/her understanding and acceptance.     Plan Discussed with: CRNA and Surgeon  Anesthesia Plan Comments:         Anesthesia Quick Evaluation

## 2017-08-21 ENCOUNTER — Inpatient Hospital Stay (HOSPITAL_COMMUNITY)
Admission: RE | Admit: 2017-08-21 | Payer: Managed Care, Other (non HMO) | Source: Ambulatory Visit | Attending: Obstetrics and Gynecology | Admitting: Obstetrics and Gynecology

## 2017-08-21 LAB — CBC
HCT: 22.7 % — ABNORMAL LOW (ref 36.0–46.0)
HEMOGLOBIN: 7.4 g/dL — AB (ref 12.0–15.0)
MCH: 26.2 pg (ref 26.0–34.0)
MCHC: 32.6 g/dL (ref 30.0–36.0)
MCV: 80.5 fL (ref 78.0–100.0)
PLATELETS: 204 10*3/uL (ref 150–400)
RBC: 2.82 MIL/uL — ABNORMAL LOW (ref 3.87–5.11)
RDW: 15.1 % (ref 11.5–15.5)
WBC: 14 10*3/uL — ABNORMAL HIGH (ref 4.0–10.5)

## 2017-08-21 LAB — RPR: RPR Ser Ql: NONREACTIVE

## 2017-08-21 NOTE — Lactation Note (Addendum)
This note was copied from a baby's chart. Lactation Consultation Note  Patient Name: Peggy Jimmy PicketShontia Ezzell ZOXWR'UToday's Date: 08/21/2017 Reason for consult: Initial assessment;Infant weight loss(baby is asleep / circ'd this am . not showing signs of hunger, mom trying to nap too, LC enc to call on the nurses light w/ feeding cues )  Baby is 19 hours old, breast fed range 5 - 35 mins x 6,Latch scores 6-4-5-5 ,  voided and stooled.  Mother informed of post-discharge support and given phone number to the lactation department, including services for phone call assistance; out-patient appointments; and breastfeeding support group. List of other breastfeeding resources in the community given in the handout. Encouraged mother to call for problems or concerns related to breastfeeding.     Maternal Data    Feeding Feeding Type: (last fed at 0700 for 35 mins )  LATCH Score                   Interventions Interventions: Breast feeding basics reviewed  Lactation Tools Discussed/Used     Consult Status Consult Status: Follow-up Date: 08/21/17 Follow-up type: In-patient    Peggy Kelly 08/21/2017, 11:33 AM

## 2017-08-21 NOTE — Anesthesia Postprocedure Evaluation (Signed)
Anesthesia Post Note  Patient: Peggy PlattShontia R Hodapp  Procedure(s) Performed: CESAREAN SECTION (N/A )     Patient location during evaluation: Mother Baby Anesthesia Type: Spinal Level of consciousness: awake and alert and oriented Pain management: satisfactory to patient Vital Signs Assessment: post-procedure vital signs reviewed and stable Respiratory status: respiratory function stable and spontaneous breathing Cardiovascular status: blood pressure returned to baseline Postop Assessment: no headache, no backache, spinal receding, patient able to bend at knees and adequate PO intake Anesthetic complications: no    Last Vitals:  Vitals:   08/21/17 0300 08/21/17 0623  BP: 125/80 (!) 132/92  Pulse:    Resp: 18 18  Temp:  36.7 C  SpO2:      Last Pain:  Vitals:   08/21/17 0624  TempSrc:   PainSc: 4    Pain Goal: Patients Stated Pain Goal: 8 (08/20/17 1100)               [name]

## 2017-08-21 NOTE — Lactation Note (Signed)
This note was copied from a baby's chart. Lactation Consultation Note  Patient Name: Peggy Kelly'UToday's Date: 08/21/2017   RN requested assistance with breastfeeding and answering questions. Baby alseep on mother's chest after 10 min feeding upon entering. Room full of visitors and mother asked if LC could come back later tonight. Mom has my # to call for assist w/next feeding.      Maternal Data    Feeding Feeding Type: Breast Fed Length of feed: 10 min  LATCH Score                   Interventions    Lactation Tools Discussed/Used     Consult Status      Hardie PulleyBerkelhammer, Ruth Boschen 08/21/2017, 8:54 PM

## 2017-08-21 NOTE — Lactation Note (Signed)
This note was copied from a baby's chart. Lactation Consultation Note  Patient Name: Boy Jimmy PicketShontia Rinehart WUJWJ'XToday's Date: 08/21/2017 Reason for consult: Follow-up assessment   Baby 29 hours and family called to help with breastfeeding. Mother prepumped w/ manual pump and hand expressed drops on R breast prior to latching. Intermittent sucks and swallows observed in modified cradle position. Praised mother for her efforts. Provided education regarding cluster feeding and on demand.    Maternal Data Has patient been taught Hand Expression?: Yes Does the patient have breastfeeding experience prior to this delivery?: No  Feeding Feeding Type: Breast Fed Length of feed: 10 min  LATCH Score Latch: Grasps breast easily, tongue down, lips flanged, rhythmical sucking.  Audible Swallowing: A few with stimulation  Type of Nipple: Everted at rest and after stimulation  Comfort (Breast/Nipple): Soft / non-tender  Hold (Positioning): Assistance needed to correctly position infant at breast and maintain latch.  LATCH Score: 8  Interventions Interventions: Breast feeding basics reviewed;Hand express;Hand pump  Lactation Tools Discussed/Used     Consult Status Consult Status: Follow-up Date: 08/22/17 Follow-up type: In-patient    Dahlia ByesBerkelhammer, Gigi [facility] 08/21/2017, 9:34 PM

## 2017-08-21 NOTE — Lactation Note (Signed)
This note was copied from a baby's chart. Lactation Consultation Note  Patient Name: Peggy Jimmy PicketShontia Lovett ZHYQM'VToday's Date: 08/21/2017 Reason for consult: Follow-up assessment   Baby 31 hours old and mother called requesting a nipple shield and stating baby is fussy wanting to breastfeed frequently. Described cluster feeding.  Encouraged to keep spoon feeding drops in addition to feedings at breast.  Parents states baby is latching on both breasts.  Provided education and encouragement that baby can latch without NS. Mother would like to try a nipple shield on her R breast. Had mother prepump and then taught mother how to apply #24NS and clean NS.  Baby asleep in grandmother's arms.   Suggest parents continue to breastfeeding without NS and if they have trouble during the night latching baby on R side they will have the NS.    Maternal Data Has patient been taught Hand Expression?: Yes Does the patient have breastfeeding experience prior to this delivery?: No  Feeding Feeding Type: Breast Fed Length of feed: 10 min  LATCH Score Latch: Grasps breast easily, tongue down, lips flanged, rhythmical sucking.  Audible Swallowing: A few with stimulation  Type of Nipple: Everted at rest and after stimulation  Comfort (Breast/Nipple): Soft / non-tender  Hold (Positioning): Assistance needed to correctly position infant at breast and maintain latch.  LATCH Score: 8  Interventions Interventions: Pre-pump if needed  Lactation Tools Discussed/Used Tools: Pump;Shells;Nipple Shields Nipple shield size: 24   Consult Status Consult Status: Follow-up Date: 08/22/17 Follow-up type: In-patient    Dahlia ByesBerkelhammer, Ruth [facility] 08/21/2017, 11:31 PM

## 2017-08-21 NOTE — Addendum Note (Signed)
Addendum  created 08/21/17 [id number] by Graciela HusbandsFussell, Tanicka Bisaillon O, CRNA   Sign clinical note

## 2017-08-21 NOTE — Lactation Note (Signed)
This note was copied from a baby's chart. Lactation Consultation Note  Patient Name: Peggy Kelly Notch AOZHY'QToday's Date: 08/21/2017 Reason for consult: Follow-up assessment;Other (Comment)(post circf / fussy )  Baby is 22 hours old, 3rd LC visit for today .  Baby awake, fussy, LC changed diaper ( wet) , and placed STS on the right breast,  Attempted to latch several times, baby fussy, gasey. LC reviewed hand expressing, tiny  Drop noted. Baby rooting, few sucks and, shallow latch, and acting like he wasn't really  Interested feeding. Baby resting STS on mom chest, and mom burping baby, baby passing gas.  Mom aware to call when abby is showing more feeding cues.  Mom had been given a hand pump, and LC provided instruction of how to clean it, soap , and basin.  Also instructed mom on the use of shells between feedings except when sleeping / indicated due to  areola edema, mom already has a breast feeding bra she is wearing.  Latch score 6 .    Maternal Data Has patient been taught Hand Expression?: Yes Does the patient have breastfeeding experience prior to this delivery?: No  Feeding Feeding Type: Breast Fed Length of feed: (baby gasey , fussy, attempted to latch, STS on moms chest )  LATCH Score Latch: Repeated attempts needed to sustain latch, nipple held in mouth throughout feeding, stimulation needed to elicit sucking reflex.  Audible Swallowing: None  Type of Nipple: Everted at rest and after stimulation  Comfort (Breast/Nipple): Soft / non-tender  Hold (Positioning): Assistance needed to correctly position infant at breast and maintain latch.  LATCH Score: 6  Interventions Interventions: Breast feeding basics reviewed;Skin to skin;Adjust position;Support pillows;Position options  Lactation Tools Discussed/Used Pump Review: Setup, frequency, and cleaning Initiated by:: LC / MAI - reviewed    Consult Status Consult Status: Follow-up Date: 08/21/17 Follow-up type:  In-patient    Matilde SprangMargaret Ann Lynn Recendiz 08/21/2017, 2:09 PM

## 2017-08-21 NOTE — Progress Notes (Signed)
Subjective: Postpartum Day 1: Cesarean Delivery Patient reports tolerating PO.   Requests circ.  Objective: Vital signs in last 24 hours: Temp:  [97 F (36.1 C)-98.4 F (36.9 C)] 98.1 F (36.7 C) (07/29 [id number]) Pulse Rate:  [79-99] 85 (07/28 2035) Resp:  [13-40] 18 (07/29 0623) BP: (122-142)/(66-97) 132/92 (07/29 0623) SpO2:  [94 %-100 %] 99 % (07/28 2145)  Physical Exam:  General: alert, cooperative and appears stated age Lochia: appropriate Uterine Fundus: firm Incision: healing well, no significant drainage, small amount of staining on honeycomb DVT Evaluation: No evidence of DVT seen on physical exam. Negative Homan's sign. No cords or calf tenderness.  Recent Labs    08/20/17 0850 08/21/17 0512  HGB 10.2* 7.4*  HCT 30.9* 22.7*    Assessment/Plan: Status post Cesarean section. Doing well postoperatively.  Continue current care. Patient counseled for circ including risk of bleeding, infection, and scarring.  All questions were answered and the patient wishes to proceed.  Constantine Ruddick 08/21/2017, 8:04 AM

## 2017-08-22 LAB — BIRTH TISSUE RECOVERY COLLECTION (PLACENTA DONATION)

## 2017-08-22 NOTE — Lactation Note (Signed)
This note was copied from a baby's chart. Lactation Consultation Note  Patient Name: Peggy Jimmy PicketShontia Kelly ZOXWR'UToday's Date: 08/22/2017 Reason for consult: Follow-up assessment;Term;Mother's request P1, c/section delivery , weight loss -10%, flat w/ short shaft nipples. LC did not use nipple shield.  LC had mom pre-pump breast for nipple stimulation/  eversion and using nipple roll on left  breast prior to latching infant. Mom hand express colostrum and latched infant to left breast in chair using football hold. After few attempts with latching , reinforcing mom to wait until infant's mouth is wide  w/ wide gape 'like biting into an apple', Infant latched well w/ chin pull down and adjusted.  Infant had rhythmic sucking and audible swallowing was heard by LC . Infant BF for 20 minutes. Mom pumped right breast with medela DEBP, while  infant was feeding on the right breast, mom expressed 6 ml of breast milk which infant was fed to infant ( 6 ml) EBM using a curve tip syringe by RN.  Mom felt confident and was happy with the feeding.  Mom shown how to use DEBP & how to disassemble, clean, & reassemble parts. Mom plans to feed infant by hunger cues, 8-12 times within/ 24 hours including nights. Mom plans to pump q3h for 15-20 minutes after feeding infant at breast.  Mom has my # if she has any questions or concerns.  Maternal Data Formula Feeding for Exclusion: No Has patient been taught Hand Expression?: Yes Does the patient have breastfeeding experience prior to this delivery?: No  Feeding Feeding Type: Breast Fed Length of feed: 20 min  LATCH Score Latch: Grasps breast easily, tongue down, lips flanged, rhythmical sucking.  Audible Swallowing: Spontaneous and intermittent  Type of Nipple: Flat(nipple roll and pre-hand pump)  Comfort (Breast/Nipple): Soft / non-tender  Hold (Positioning): Assistance needed to correctly position infant at breast and maintain latch.  LATCH Score:  8  Interventions Interventions: Assisted with latch;Skin to skin;Breast massage;Hand express;Pre-pump if needed;Support pillows;Position options;DEBP;Hand pump;Breast compression;Expressed milk  Lactation Tools Discussed/Used Tools: Flanges;Pump;Feeding cup Pump Review: Setup, frequency, and cleaning Initiated by:: Danelle Earthlyobin Marbin Olshefski, IBCLC Date initiated:: 08/22/17   Consult Status Consult Status: Follow-up Date: 08/23/17    Danelle EarthlyRobin Gerardine Peltz 08/22/2017, 9:47 PM

## 2017-08-22 NOTE — Progress Notes (Signed)
Patients consents to a Baby [employer] interview tomorrow on 08/22/17.

## 2017-08-22 NOTE — Progress Notes (Signed)
Subjective: Postpartum Day 2: Cesarean Delivery Patient reports tolerating PO, + flatus and no problems voiding.    Objective: Vital signs in last 24 hours: Temp:  [98.2 F (36.8 C)-98.4 F (36.9 C)] 98.4 F (36.9 C) (07/30 [id number]) Pulse Rate:  [83] 83 (07/30 0642) Resp:  [16] 16 (07/30 0642) BP: (121-131)/(74-78) 121/78 (07/30 0642) SpO2:  [99 %-100 %] 100 % (07/30 [id number])  Physical Exam:  General: alert, cooperative and appears stated age Lochia: appropriate Uterine Fundus: firm Incision: healing well, no significant drainage, no dehiscence, no significant erythema DVT Evaluation: No evidence of DVT seen on physical exam. Negative Homan's sign. No cords or calf tenderness. No significant calf/ankle edema.  Recent Labs    08/20/17 0850 08/21/17 0512  HGB 10.2* 7.4*  HCT 30.9* 22.7*    Assessment/Plan: Status post Cesarean section. Doing well postoperatively.  Continue current care.  Peggy Kelly 08/22/2017, 8:24 AM

## 2017-08-23 ENCOUNTER — Inpatient Hospital Stay (HOSPITAL_COMMUNITY): Admission: RE | Admit: 2017-08-23 | Payer: Managed Care, Other (non HMO) | Source: Ambulatory Visit

## 2017-08-23 MED ORDER — OXYCODONE-ACETAMINOPHEN 5-325 MG PO TABS: 1.0000 | ORAL_TABLET | ORAL | 0 refills | Status: DC | PRN

## 2017-08-23 MED ORDER — IBUPROFEN 600 MG PO TABS: 600.0000 mg | ORAL_TABLET | Freq: Four times a day (QID) | ORAL | 0 refills | Status: DC

## 2017-08-23 NOTE — Discharge Summary (Signed)
Obstetric Discharge Summary Reason for Admission: rupture of membranes Prenatal Procedures: ultrasound Intrapartum Procedures: cesarean: low cervical, transverse Postpartum Procedures: none Complications-Operative and Postpartum: none Hemoglobin  Date Value Ref Range Status  08/21/2017 7.4 (L) 12.0 - 15.0 g/dL Final    Comment:    DELTA CHECK NOTED REPEATED TO VERIFY   06/09/2017 9.5 (L) 11.6 - 15.9 g/dL Final   HGB  Date Value Ref Range Status  09/23/2015 11.2 (L) 11.6 - 15.9 g/dL Final   HCT  Date Value Ref Range Status  08/21/2017 22.7 (L) 36.0 - 46.0 % Final  09/23/2015 35.2 34.8 - 46.6 % Final    Physical Exam:  General: alert and cooperative Lochia: appropriate Uterine Fundus: firm Incision: healing well DVT Evaluation: No evidence of DVT seen on physical exam.  Discharge Diagnoses: Term Pregnancy-delivered  Discharge Information: Date: 08/23/2017 Activity: pelvic rest Diet: routine Medications: PNV, Ibuprofen and Percocet Condition: stable Instructions: refer to practice specific booklet Discharge to: home Follow-up Information    [place], [facility] Schedule an appointment as soon as possible for a visit in 2 week(s).   Contact information: [address] [place] [address] [phone]           Newborn Data: Live born female  Birth Weight: 7 lb 6 oz (3345 g) APGAR: 9, 9  Newborn Delivery   Birth date/time:  [birth year]/2017 15:49:00 Delivery type:  C-Section, Low Transverse Trial of labor:  Yes C-section categorization:  Primary     Home with mother.  Peggy Kelly 08/23/2017, 8:15 AM

## 2017-08-23 NOTE — Lactation Note (Signed)
This note was copied from a baby's chart. Lactation Consultation Note; Baby asleep in bassinet, mom eating breakfast. Reports breast feeding is going better. Reports breasts are feeling heavier this morning. Has about 15 ml EBM at bedside. Encouraged to feed all EBM to baby. Weight loss at 11.8 %- lost 2 oz. Encouraged frequent feeding whenever baby showing cues. To see Ped tomorrow. No questions at present. Reviewed our phone number, OP appointments and BFSg as resources for support after DC. To call prn  Patient Name: Peggy Kelly ZHYQM'VToday's Date: 08/23/2017 Reason for consult: Follow-up assessment   Maternal Data Formula Feeding for Exclusion: No Has patient been taught Hand Expression?: Yes Does the patient have breastfeeding experience prior to this delivery?: No  Feeding Feeding Type: Breast Milk  LATCH Score                   Interventions    Lactation Tools Discussed/Used Breast pump type: Double-Electric Breast Pump;Manual   Consult Status Consult Status: Complete    Pamelia HoitWeeks, Casandra Dallaire D 08/23/2017, 9:51 AM

## 2017-08-30 ENCOUNTER — Inpatient Hospital Stay (HOSPITAL_COMMUNITY)
Admission: AD | Admit: 2017-08-30 | Discharge: 2017-08-31 | Disposition: A | Discharge status: Home / Self Care | Payer: Managed Care, Other (non HMO) | Source: Ambulatory Visit | Attending: Obstetrics and Gynecology | Admitting: Obstetrics and Gynecology

## 2017-08-30 DIAGNOSIS — Z79891 Long term (current) use of opiate analgesic: Secondary | ICD-10-CM | POA: Insufficient documentation

## 2017-08-30 DIAGNOSIS — T148XXA Other injury of unspecified body region, initial encounter: Secondary | ICD-10-CM

## 2017-08-30 DIAGNOSIS — O9089 Other complications of the puerperium, not elsewhere classified: Secondary | ICD-10-CM | POA: Insufficient documentation

## 2017-08-30 DIAGNOSIS — Z79899 Other long term (current) drug therapy: Secondary | ICD-10-CM | POA: Insufficient documentation

## 2017-08-30 DIAGNOSIS — Z8042 Family history of malignant neoplasm of prostate: Secondary | ICD-10-CM | POA: Insufficient documentation

## 2017-08-30 DIAGNOSIS — Z8349 Family history of other endocrine, nutritional and metabolic diseases: Secondary | ICD-10-CM | POA: Insufficient documentation

## 2017-08-30 DIAGNOSIS — Z880 Allergy status to penicillin: Secondary | ICD-10-CM | POA: Insufficient documentation

## 2017-08-30 DIAGNOSIS — Z791 Long term (current) use of non-steroidal anti-inflammatories (NSAID): Secondary | ICD-10-CM | POA: Insufficient documentation

## 2017-08-30 DIAGNOSIS — T8189XA Other complications of procedures, not elsewhere classified, initial encounter: Secondary | ICD-10-CM | POA: Insufficient documentation

## 2017-08-30 DIAGNOSIS — O165 Unspecified maternal hypertension, complicating the puerperium: Secondary | ICD-10-CM | POA: Insufficient documentation

## 2017-08-30 DIAGNOSIS — X58XXXA Exposure to other specified factors, initial encounter: Secondary | ICD-10-CM | POA: Insufficient documentation

## 2017-08-30 DIAGNOSIS — S30811A Abrasion of abdominal wall, initial encounter: Secondary | ICD-10-CM | POA: Insufficient documentation

## 2017-08-30 DIAGNOSIS — Z9889 Other specified postprocedural states: Secondary | ICD-10-CM | POA: Insufficient documentation

## 2017-08-30 NOTE — MAU Note (Signed)
Pt states she had a c/s on last Sunday. Tonight she noticed a small amount of drainage. Just wanted a piece of mind to make sure there was not infection. Pt denies pain outside of regular incisional pain. Pt denies fever or odor from incision. Reports some discomfort with urination.

## 2017-08-31 ENCOUNTER — Encounter (HOSPITAL_COMMUNITY): Payer: Self-pay

## 2017-08-31 DIAGNOSIS — O165 Unspecified maternal hypertension, complicating the puerperium: Secondary | ICD-10-CM | POA: Diagnosis not present

## 2017-08-31 DIAGNOSIS — X58XXXA Exposure to other specified factors, initial encounter: Secondary | ICD-10-CM | POA: Diagnosis not present

## 2017-08-31 DIAGNOSIS — Z9889 Other specified postprocedural states: Secondary | ICD-10-CM | POA: Diagnosis not present

## 2017-08-31 DIAGNOSIS — Z791 Long term (current) use of non-steroidal anti-inflammatories (NSAID): Secondary | ICD-10-CM | POA: Diagnosis not present

## 2017-08-31 DIAGNOSIS — S30811A Abrasion of abdominal wall, initial encounter: Secondary | ICD-10-CM | POA: Diagnosis not present

## 2017-08-31 DIAGNOSIS — Z79899 Other long term (current) drug therapy: Secondary | ICD-10-CM | POA: Diagnosis not present

## 2017-08-31 DIAGNOSIS — O9089 Other complications of the puerperium, not elsewhere classified: Secondary | ICD-10-CM | POA: Diagnosis not present

## 2017-08-31 DIAGNOSIS — Z8349 Family history of other endocrine, nutritional and metabolic diseases: Secondary | ICD-10-CM | POA: Diagnosis not present

## 2017-08-31 DIAGNOSIS — Z79891 Long term (current) use of opiate analgesic: Secondary | ICD-10-CM | POA: Diagnosis not present

## 2017-08-31 DIAGNOSIS — Z880 Allergy status to penicillin: Secondary | ICD-10-CM | POA: Diagnosis not present

## 2017-08-31 DIAGNOSIS — Z8042 Family history of malignant neoplasm of prostate: Secondary | ICD-10-CM | POA: Diagnosis not present

## 2017-08-31 DIAGNOSIS — T8189XA Other complications of procedures, not elsewhere classified, initial encounter: Secondary | ICD-10-CM | POA: Diagnosis not present

## 2017-08-31 NOTE — Discharge Instructions (Signed)

## 2017-08-31 NOTE — MAU Provider Note (Signed)
Chief Complaint:  Postpartum Complications and Drainage from Incision   First Provider Initiated Contact with Patient 08/31/17 0005      HPI: Peggy Kelly is a 27 y.o. G1P1001 who presents to maternity admissions reporting drainage of fluid near incision.  Had a Cesarean section on 08/20/17.  States was in office this week. She reports no vaginal bleeding, vaginal itching/burning, urinary symptoms, h/a, dizziness, n/v, or fever/chills.    Other  This is a new problem. The current episode started today. The problem has been unchanged. Associated symptoms include abdominal pain. Pertinent negatives include no chills, fever, headaches, myalgias or nausea. Nothing aggravates the symptoms. She has tried nothing for the symptoms.    RN Note: Pt states she had a c/s on last Sunday. Tonight she noticed a small amount of drainage. Just wanted a piece of mind to make sure there was not infection. Pt denies pain outside of regular incisional pain. Pt denies fever or odor from incision. Reports some discomfort with urination  Past Medical History: Past Medical History:  Diagnosis Date  . Allergy   . Anemia   . Migraines   . Seasonal allergies   . Vaginal Pap smear, abnormal     Past obstetric history: OB History  Gravida Para Term Preterm AB Living  1 1 1     1   SAB TAB Ectopic Multiple Live Births        0 1    # Outcome Date GA Lbr Len/2nd Weight Sex Delivery Anes PTL Lv  1 Term 08/20/17 [id number]  7 lb 6 oz (3.345 kg) M CS-LTranv Spinal  LIV    Past Surgical History: Past Surgical History:  Procedure Laterality Date  . APPENDECTOMY    . CESAREAN SECTION N/A 08/20/2017   Procedure: CESAREAN SECTION;  Surgeon: Candice CampLowe, David, MD;  [facility];  Service: Obstetrics;  Laterality: N/A;  . COLPOSCOPY N/A 05/09/2013   Procedure: COLPOSCOPY WITH ECC;  Surgeon: Zelphia CairoGretchen Adkins, MD;  Location: [facility];  Service: Gynecology;  Laterality: N/A;  . DILATATION & CURETTAGE/HYSTEROSCOPY  WITH TRUECLEAR N/A 05/09/2013   Procedure: DILATATION & CURETTAGE/HYSTEROSCOPY WITH TRUCLEAR;  Surgeon: Zelphia CairoGretchen Adkins, MD;  Location: [facility];  Service: Gynecology;  Laterality: N/A;  . LEEP      Family History: Family History  Problem Relation Age of Onset  . Thyroid disease Mother   . Migraines Sister   . Other Sister        anti NMDA receptor encephalitis  . Thyroid disease Maternal Grandmother   . Cancer Maternal Grandfather        prostate    Social History: Social History   Tobacco Use  . Smoking status: Never Smoker  . Smokeless tobacco: Never Used  Substance Use Topics  . Alcohol use: Yes    Comment: occassional, 09/23/15 none now  . Drug use: No    Allergies:  Allergies  Allergen Reactions  . Penicillins Hives    Patient doesn't remember what type of reaction. Has patient had a PCN reaction causing immediate rash, facial/tongue/throat swelling, SOB or lightheadedness with hypotension: No Has patient had a PCN reaction causing severe rash involving mucus membranes or skin necrosis: No Has patient had a PCN reaction that required hospitalization: No Has patient had a PCN reaction occurring within the last 10 years: No If all of the above answers are "NO", then may proceed with Cephalosporin use.     Meds:  Medications Prior to Admission  Medication Sig Dispense Refill  Last Dose  . calcium carbonate (TUMS - DOSED IN MG ELEMENTAL CALCIUM) 500 MG chewable tablet Chew 1 tablet by mouth 2 (two) times daily as needed for indigestion or heartburn.   08/18/2017  . ibuprofen (ADVIL,MOTRIN) 600 MG tablet Take 1 tablet (600 mg total) by mouth every 6 (six) hours. 30 tablet 0   . neomycin-polymyxin-hydrocortisone (CORTISPORIN) OTIC solution Place 3 drops into the right ear 4 (four) times daily. 10 mL 0 08/20/2017 at Unknown time  . oxyCODONE-acetaminophen (PERCOCET/ROXICET) 5-325 MG tablet Take 1-2 tablets by mouth every 4 (four) hours as needed for severe pain. 15 tablet 0    . Prenatal Vit-Fe Fumarate-FA (PRENATAL VITAMIN PO) Take 1 capsule by mouth daily.   08/19/2017 at Unknown time  . ranitidine (ZANTAC) 150 MG tablet Take 150 mg by mouth 2 (two) times daily.   08/19/2017    I have reviewed patient's Past Medical Hx, Surgical Hx, Family Hx, Social Hx, medications and allergies.  ROS:  Review of Systems  Constitutional: Negative for chills and fever.  Gastrointestinal: Positive for abdominal pain. Negative for nausea.  Musculoskeletal: Negative for myalgias.  Neurological: Negative for headaches.   Other systems negative     Physical Exam   Patient Vitals for the past 24 hrs:  BP Temp Pulse Resp SpO2 Height Weight  08/30/17 2359 (!) 140/91 98.5 F (36.9 C) 89 18 100 % 5\' 4"  (1.626 m) 187 lb (84.8 kg)   Constitutional: Well-developed, well-nourished female in no acute distress.  Cardiovascular: normal rate and rhythm, no ectopy audible, S1 & S2 heard, no murmur Respiratory: normal effort, no distress. Lungs CTAB with no wheezes or crackles GI: Abd soft, non-tender.  Nondistended.  No rebound, No guarding. Incision is intact and well approximated.  Above incision, there are two spots where adhesive from dressing has pulled off a thin layer of epidermis. Not bleeding but moist. MS: Extremities nontender, no edema, normal ROM Neurologic: Alert and oriented x 4.   Grossly nonfocal. GU: Neg CVAT. Skin:  Warm and Dry Psych:  Affect appropriate.  PELVIC EXAM: deferred   Labs: No results found for this or any previous visit (from the past 24 hour(s)). --/--/A POS (07/28 0850)  Imaging:  No results found.  MAU Course/MDM: I have ordered labs as follows: none Imaging ordered: none   Consult Dr Elon Spanner.  .   Treatments in MAU included application of Telfa dressing.  Instructed to wash daily with soap and water and pat dry.  Keep neosporin or telfa on area until it heals.   Pt stable at time of discharge.  Assessment: Skin abrasion due to adhesive  dressing Mild postpartum hypertension, with no symptoms of preeclampsia  Plan: Discharge home Recommend watch for signs of headache, visual changes or RUQ pain Severa; telfa pads given for wound care  Encouraged to return here or to other Urgent Care/ED if she develops worsening of symptoms, increase in pain, fever, or other concerning symptoms.   Wynelle Bourgeois CNM, MSN Certified Nurse-Midwife 08/31/2017 12:05 AM

## 2017-09-20 ENCOUNTER — Ambulatory Visit: Payer: Self-pay

## 2017-09-20 NOTE — Lactation Note (Signed)
This note was copied from a baby's chart. 09/20/2017  Name: Peggy Kelly MRN: [id number] Date of Birth: 08/20/2018 Gestational Age: Gestational Age: [redacted]w[redacted]d Birth Weight: 118 oz Weight today:    9 pounds 4.8 ounces (4218 grams) with clean size 1 diaper   Peggy Kelly presents today with mom and dad for feeding assessment. Mom reports infant has a tongue tie and would like to have him evaluated.   Infant has gained 1267 grams in the last 29 days with an average daily weight gain of 44 grams a day.   Mom reports she is not BF infant currently. She reports infant has been latching but she became sore so she stopped a few days ago.   Infant with thick labial frenulum that inserts at the bottom of the gum ridge. Infant with divot to center of gum ridge. Upper lip is very tight with flanging. Upper lip needs flanging on the breast. Infant with thin short lingual frenulum that is anterior/posterior in appearance. Infant with good tongue extension and lateralization. Infant with some limited mid tongue elevation noted. Infant with tongue thrusting and needing time to organize suckle on gloved finger. Infant with strong suckle once he begins suckling. Mom reports pinching and pain to nipples with BF. Parents were shown oral structures. Parents given information on Tongue and lip restrictions and local provider list. Parents were informed of how tongue and lip restriction can effect BF, latch, and milk supply.   Infant latched to both breasts for about 5 minutes each. Mom reports some pinching with feeding. Infant transferred 8 ml on the left and 12 ml on the right. Mom reported more pain on the right breast. Mom has # 24 NS at home to use as needed. Infant finished feeding with Nanobebe bottle, he tolerated it well.   Infant to see Dr. Hyacinth Meeker today. [facility] have been out to see mom. Mom attends BF Support Groups. Mom to follow up with Lactation 1-2 days post tongue/lip release if completed.    Parents report all questions have been answered at this time. Mom to call with questions/concerns as needed.    General Information: Mother's reason for visit: Feeding assessment, tongue tie assessment Consult: Initial Lactation consultant: Noralee Stain RN,IBCLC Breastfeeding experience: Currently not latching, pumping and bottle feeding Maternal medical conditions: Infertility(infertility on the female part) Maternal medications: Pre-natal vitamin, Motrin (ibuprofen)  Breastfeeding History: Frequency of breast feeding: not currently latching    Supplementation: Supplement method: bottle(Dr. Brown's, [facility], Nanobebe)         Breast milk volume: 3-4 ounces Breast milk frequency: every 2 hours   Pump type: Spectra Pump frequency: every 2 hours on infant feeding schedule Pump volume: 8-10 ounces/pumping. Has milk stored in the freezer  Infant Output Assessment: Voids per 24 hours: 10 Urine color: Clear yellow Stools per 24 hours: 10 Stool color: Yellow  Breast Assessment: Breast: Soft Nipple: Erect Pain level: (pinching with latch) Pain interventions: Bra, Expressed breast milk, Lanolin, Coconut oil  Feeding Assessment: Infant oral assessment: Variance Infant oral assessment comment: Infant with thick labial frenulum that inserts at the bottom of the gum ridge. Infant with divot to center of gum ridge. Upper lip is very tight with flanging. Upper lip needs flanging on the breast. Infant with thin short lingual frenulum that is anterior/posterior in appearance. Infant with good tongue extension and lateralization. Infant with some limited mid tongue elevation noted. Infant with tongue thrusting and needing time to organize suckle on gloved finger. Infant with strong  suckle once he begins suckling. Mom reports pinching and pain to nipples with BF. Positioning: Cross cradle(left breast) Latch: 2 - Grasps breast easily, tongue down, lips flanged, rhythmical sucking. Audible  swallowing: 1 - A few with stimulation Type of nipple: 2 - Everted at rest and after stimulation Comfort: 2 - Soft/non-tender Hold: 1 - Assistance needed to correctly position infant at breast and maintain latch LATCH score: 8 Latch assessment: Deep Lips flanged: No(upper and lower lip needs flanging) Suck assessment: Displays both   Pre-feed weight: 4212 grams Post feed weight: 4220 grams Amount transferred: 8 ml    Additional Feeding Assessment: Infant oral assessment: Variance Infant oral assessment comment: Infant with thick labial frenulum that inserts at the bottom of the gum ridge. Infant with divot to center of gum ridge. Upper lip is very tight with flanging. Upper lip needs flanging on the breast. Infant with thin short lingual frenulum that is anterior/posterior in appearance. Infant with good tongue extension and lateralization. Infant with some limited mid tongue elevation noted. Infant with tongue thrusting and needing time to organize suckle on gloved finger. Infant with strong suckle once he begins suckling. Mom reports pinching and pain to nipples with BF. Positioning: Cross cradle(right breast) Latch: 1 - Repeated attempts neede to sustain latch, nipple held in mouth throughout feeding, stimulation needed to elicit sucking reflex. Audible swallowing: 2 - Spontaneous and intermittent Type of nipple: 2 - Everted at rest and after stimulation Comfort: 1 - Filling, red/small blisters or bruises, mild/mod discomfort Hold: 1 - Assistance needed to correctly position infant at breast and maintain latch LATCH score: 7 Latch assessment: Deep Lips flanged: No(nneds upper and lower lip flanged) Suck assessment: Displays both   Pre-feed weight: 4220 grams Post feed weight: 4232 grams Amount transferred: 12 ml    Totals: Total amount transferred: 20 ml   Total amount pumped post feed: did not pump   Plan: 1. Offer infant the breast with feeding cues when mom and baby  want, would encourage you to let infant practice at the breast and to use the Nipple Shield as needed for pain /pinching 2. Empty first breast before offering second breast 3. Keep infant awake at the breast as needed, feed infant skin to skin 4. Offer infant bottle of breast milk after breast feeding if he is still hungry after feeding at the breast 5. Continue pumping 8-12 x a day with your Spectra pump to protect milk supply until breasts are empty 6. Infant needs about 79-105 ml (2.5-3.5 ounces) for 8 feedings a day or 630-840 ml (21-28 ounces) in 24 hours. Infant may eat more or less depending on how often he eats 7. Continue using the paced bottle feeding method for feeding 8. Slow flow nipples are preferred (Dr. Theora GianottiBrown's is preferred) 9. Consider having infant evaluated by Oral Specialist 10. Keep up with good work 11. Thank you for allowing me to assist you today 12. Please call with any questions/concerns as needed [phone] 13. Follow up with Lactation 1-2 days post tongue/lip release if completed and as needed Ed BlalockSharon S Rochella Benner RN, IBCLC                                                      Silas FloodSharon S Landi Biscardi 09/20/2017, 2:32 PM

## 2017-10-25 ENCOUNTER — Ambulatory Visit: Payer: Self-pay

## 2017-10-25 NOTE — Lactation Note (Addendum)
This note was copied from a baby's chart. 10/25/2017  Name: Peggy Kelly MRN: [id number] Date of Birth: 08/20/2018 Gestational Age: Gestational Age: [redacted]w[redacted]d Birth Weight: 118 oz Weight today:    11 Pounds 8.8 ounces (5238 grams) with clean size 1 diaper  Peggy Kelly is a term infant who presents today for follow up post tongue and lip releases on 9/26 by Dr. Orland Mustard.   Infant has gained 1020 grams in the last 35 days with an average daily weight gain of 29 grams a day.   Mom reports infant has been latching better and BF every other feeding. Mom reports pain has improved with feeding.   Mom reports infant is fed with a bottle when not BF and tolerates it well. Mom reports infant not clicking as much on the bottle as he was. Mom reports infant was spitting a lot after feedings and that has improved recently since release.   Infant is being followed by Dr. Hermelinda Medicus, a Chiropractor in town.   Infant with granulation tissue to upper lip, upper lip is still tight with flanging and on the breast. Infant with small diamond shape under his tongue, it is difficult to get under infant tongue well. Infant did not have a strong suckle on gloved finger today. Infant with strong bite on gloved finger. Infant gaggy when finger inserted in mouth.   Mom was shown suck training exercises and was encouraged to preform finger sucking, down and out stroking, lateralizing and desensitizing gag reflex. Enc mom to perform 5-6 x a day for 1-2 minutes each exercise for 2-3 weeks .   Infant latched and fed briefly, he was content post feeding. Mom reports this is his usual feeding pattern, mom pumps post feeding when infant does not empty the breast to protect milk supply.   Infant to follow up with Pediatrician in December. Infant to follow up with Dr. Orland Mustard on 10/14. Infant to follow up with Lactation as needed. Mom aware of BF Support Groups. Mom with no further questions at this time. Mom to call with  questions/concerns as needed.     General Information: Mother's reason for visit: Follow up feeding assessment post tongue/lip release on 9/26 by Dr. Orland Mustard Consult: Follow-up Lactation consultant: Noralee Stain RN,IBCLC Breastfeeding experience: Latching has improved, pain has improved   Maternal medications: Pre-natal vitamin  Breastfeeding History: Frequency of breast feeding: every other feeding Duration of feeding: 15-20 minutes  Supplementation: Supplement method: bottle([facility], [place], Dr. Theora Gianotti )         Breast milk volume: 4 ounces Breast milk frequency: every other feeding   Pump type: Spectra Pump frequency: 3-4 x a day Pump volume: 5-6 ounces  Infant Output Assessment: Voids per 24 hours: 10 Urine color: Clear yellow Stools per 24 hours: 5 + smears Stool color: Yellow  Breast Assessment: Breast: Soft, Compressible Nipple: Erect Pain level: 0 Pain interventions: Bra, Coconut oil, Expressed breast milk, Lanolin  Feeding Assessment: Infant oral assessment: Variance Infant oral assessment comment: Infant with granulation tissue to upper lip, upper lip is still tight with flanging and on the breast. Infant with small diamond shape under his tongue, it is difficult to get under infant tongue well. Infant did not have a strong suckle on gloved finger today. Infant with strong bite on gloved finger. Infant gaggy when finger inserted in mouth.  Positioning: Cross cradle(left breast) Latch: 1 - Repeated attempts needed to sustain latch, nipple held in mouth throughout feeding, stimulation needed to elicit sucking reflex. Audible swallowing:  2 - Spontaneous and intermittent Type of nipple: 2 - Everted at rest and after stimulation Comfort: 2 - Soft/non-tender Hold: 2 - No assistance needed to correctly position infant at breast LATCH score: 9 Latch assessment: Deep Lips flanged: Yes     Pre-feed weight: 5238 grams Post feed weight: 5266 grams Amount  transferred: 28 ml Amount supplemented: 0  Additional Feeding Assessment:                                    Totals: Total amount transferred: 28 ml Total supplement given: 0 Total amount pumped post feed: did not pump   Plan: 1. Offer infant the breast with feeding cues when mom and baby want 2. Empty first breast before offering second breast 3. Keep infant awake at the breast as needed, feed infant skin to skin 4. Offer infant bottle of breast milk after breast feeding if he is still hungry after feeding at the breast or in place of breast feeding 5. Continue pumping anytime infant is getting a bottle to protect milk supply, pump after breast feeding if infant does not empty the breast 6. Continue using the paced bottle feeding method for feeding 7. Slow flow nipples are preferred 8. Continue stretches as prescribed by Dr. Orland Mustard 9. Perform suck training exercises 5-6 x a day, 1-2 minutes each exercised for 2-3 weeks 10. Keep up with good work 11. Thank you for allowing me to assist you today 12. Please call with any questions/concerns as needed [phone] 13. Follow up with Lactation as needed    Ed Blalock RN, IBCLC                                                      Peggy Kelly 10/25/2017, 11:59 AM

## 2018-03-22 ENCOUNTER — Encounter: Payer: Self-pay | Admitting: Medical

## 2018-03-22 ENCOUNTER — Ambulatory Visit: Payer: Managed Care, Other (non HMO) | Admitting: Medical

## 2018-03-22 VITALS — BP 120/70 | HR 76 | Temp 97.9°F | Resp 16 | Ht 65.5 in | Wt 160.2 lb

## 2018-03-22 DIAGNOSIS — H6690 Otitis media, unspecified, unspecified ear: Secondary | ICD-10-CM | POA: Diagnosis not present

## 2018-03-22 DIAGNOSIS — H7392 Unspecified disorder of tympanic membrane, left ear: Secondary | ICD-10-CM

## 2018-03-22 DIAGNOSIS — D509 Iron deficiency anemia, unspecified: Secondary | ICD-10-CM

## 2018-03-22 MED ORDER — FLUCONAZOLE 150 MG PO TABS: 150.0000 mg | ORAL_TABLET | Freq: Once | ORAL | 0 refills | Status: AC

## 2018-03-22 MED ORDER — CLARITHROMYCIN 500 MG PO TABS: 500.0000 mg | ORAL_TABLET | Freq: Two times a day (BID) | ORAL | 0 refills | Status: DC

## 2018-03-22 NOTE — Progress Notes (Signed)
  Subjective:     Patient ID: Peggy Kelly, female   DOB: 1990/09/17, 28 y.o.   MRN: [id number]  HPI Chief Complaint  Patient presents with  . NP    NP get established, ear pain cough    Referred by Cipriano Bunker and Ranae Palms  Having some issues with ears hurting, slight cough, some head congestion, x 3 week.   Sometime ear popping.  Had a sore throat but that is better.   No fever, no nausea, no vomiting.   Flew to [place], the week of Valentines Day.  Got sick when she got back.  Went to Urgent Care the Tuesday she came back from [place].   Was put on Zpak for sinus infection.   Rarely gets sick, but after son turned 26mo, started getting some illnesses when he started day care.  Breast feeding.        Baby is 7mo tomorrow, wants to keep from getting them sick.    Has hx/o iron deficiency, hx/o multiple iron IV infusions.  Past Medical History:  Diagnosis Date  . Allergy   . Anemia   . Anxiety   . Migraines   . Seasonal allergies   . Vaginal Pap smear, abnormal     Review of Systems ROS as in subjective      Objective:   Physical Exam BP 120/70   Pulse 76   Temp 97.9 F (36.6 C) (Oral)   Resp 16   Ht 5' 5.5" (1.664 m)   Wt 160 lb 3.2 oz (72.7 kg)   LMP 03/05/2018 (Exact Date)   SpO2 98%   BMI 26.25 kg/m   General appearance: alert, no distress, WD/WN, AA female HEENT: normocephalic, sclerae anicteric, left TM superiorly with erythema and 23mm x 73mm oval area of bruising, right TM superiorly with erythema, retraction of both TMs, nares patent, no discharge or erythema, pharynx normal Oral cavity: MMM, no lesions Neck: supple, no lymphadenopathy, no thyromegaly, no masses Heart: RRR, normal S1, S2, no murmurs Lungs: CTA bilaterally, no wheezes, rhonchi, or rales     Assessment:     Encounter Diagnoses  Name Primary?  . Abnormal tympanic membrane of left ear Yes  . Acute otitis media, unspecified otitis media type   . Iron deficiency anemia,  unspecified iron deficiency anemia type        Plan:     Discussed concerns, exam findings  Begin Biaxin, rest, hydrate well, and if yeast vaginitis in sues, can use Diflucan as she requested  Recheck labs today for hx/o iron deficiency anemia.  Peggy Kelly was seen today for np.  Diagnoses and all orders for this visit:  Abnormal tympanic membrane of left ear  Acute otitis media, unspecified otitis media type  Iron deficiency anemia, unspecified iron deficiency anemia type -     CBC -     Iron  Other orders -     clarithromycin (BIAXIN) 500 MG tablet; Take 1 tablet (500 mg total) by mouth 2 (two) times daily. -     fluconazole (DIFLUCAN) 150 MG tablet; Take 1 tablet (150 mg total) by mouth once for 1 dose.

## 2018-03-22 NOTE — Patient Instructions (Signed)
Recommendations  Drink plenty of water throughout the day  Consider nasal saline flush if head congestion, snotty nose  Begin Biaxin antibiotic twice daily for 7-10 days  If worse or not improving within the next week, then call or return  Avoid putting things in your ear canal

## 2018-03-23 ENCOUNTER — Other Ambulatory Visit: Payer: Self-pay | Admitting: Medical

## 2018-03-23 LAB — CBC
HEMOGLOBIN: 10.8 g/dL — AB (ref 11.1–15.9)
Hematocrit: 32.2 % — ABNORMAL LOW (ref 34.0–46.6)
MCH: 26.3 pg — AB (ref 26.6–33.0)
MCHC: 33.5 g/dL (ref 31.5–35.7)
MCV: 79 fL (ref 79–97)
PLATELETS: 374 10*3/uL (ref 150–450)
RBC: 4.1 x10E6/uL (ref 3.77–5.28)
RDW: 15.7 % — ABNORMAL HIGH (ref 11.7–15.4)
WBC: 7 10*3/uL (ref 3.4–10.8)

## 2018-03-23 LAB — IRON: Iron: 41 ug/dL (ref 27–159)

## 2018-07-18 ENCOUNTER — Other Ambulatory Visit: Payer: Self-pay | Admitting: Pediatric Intensive Care

## 2018-07-18 DIAGNOSIS — Z20822 Contact with and (suspected) exposure to covid-19: Secondary | ICD-10-CM

## 2018-07-21 LAB — NOVEL CORONAVIRUS, NAA: SARS-CoV-2, NAA: NOT DETECTED

## 2018-10-22 DIAGNOSIS — F330 Major depressive disorder, recurrent, mild: Secondary | ICD-10-CM | POA: Diagnosis not present

## 2018-10-29 DIAGNOSIS — F330 Major depressive disorder, recurrent, mild: Secondary | ICD-10-CM | POA: Diagnosis not present

## 2018-11-05 DIAGNOSIS — F330 Major depressive disorder, recurrent, mild: Secondary | ICD-10-CM | POA: Diagnosis not present

## 2018-11-09 DIAGNOSIS — Z6826 Body mass index (BMI) 26.0-26.9, adult: Secondary | ICD-10-CM | POA: Diagnosis not present

## 2018-11-09 DIAGNOSIS — Z01419 Encounter for gynecological examination (general) (routine) without abnormal findings: Secondary | ICD-10-CM | POA: Diagnosis not present

## 2018-11-19 DIAGNOSIS — F330 Major depressive disorder, recurrent, mild: Secondary | ICD-10-CM | POA: Diagnosis not present

## 2018-12-12 DIAGNOSIS — F330 Major depressive disorder, recurrent, mild: Secondary | ICD-10-CM | POA: Diagnosis not present

## 2018-12-26 DIAGNOSIS — F330 Major depressive disorder, recurrent, mild: Secondary | ICD-10-CM | POA: Diagnosis not present

## 2019-01-04 DIAGNOSIS — F411 Generalized anxiety disorder: Secondary | ICD-10-CM | POA: Diagnosis not present

## 2019-01-29 DIAGNOSIS — F411 Generalized anxiety disorder: Secondary | ICD-10-CM | POA: Diagnosis not present

## 2019-02-06 DIAGNOSIS — F330 Major depressive disorder, recurrent, mild: Secondary | ICD-10-CM | POA: Diagnosis not present

## 2019-02-10 DIAGNOSIS — R110 Nausea: Secondary | ICD-10-CM | POA: Diagnosis not present

## 2019-02-10 DIAGNOSIS — Z20822 Contact with and (suspected) exposure to covid-19: Secondary | ICD-10-CM | POA: Diagnosis not present

## 2019-02-10 DIAGNOSIS — R519 Headache, unspecified: Secondary | ICD-10-CM | POA: Diagnosis not present

## 2019-03-13 DIAGNOSIS — F321 Major depressive disorder, single episode, moderate: Secondary | ICD-10-CM | POA: Diagnosis not present

## 2019-03-20 DIAGNOSIS — F321 Major depressive disorder, single episode, moderate: Secondary | ICD-10-CM | POA: Diagnosis not present

## 2019-03-27 DIAGNOSIS — F411 Generalized anxiety disorder: Secondary | ICD-10-CM | POA: Diagnosis not present

## 2019-03-27 DIAGNOSIS — F321 Major depressive disorder, single episode, moderate: Secondary | ICD-10-CM | POA: Diagnosis not present

## 2019-04-10 DIAGNOSIS — F321 Major depressive disorder, single episode, moderate: Secondary | ICD-10-CM | POA: Diagnosis not present

## 2019-04-13 DIAGNOSIS — H60333 Swimmer's ear, bilateral: Secondary | ICD-10-CM | POA: Diagnosis not present

## 2019-04-17 DIAGNOSIS — F321 Major depressive disorder, single episode, moderate: Secondary | ICD-10-CM | POA: Diagnosis not present

## 2019-04-23 DIAGNOSIS — F321 Major depressive disorder, single episode, moderate: Secondary | ICD-10-CM | POA: Diagnosis not present

## 2019-05-16 ENCOUNTER — Ambulatory Visit: Payer: Medicaid Other | Attending: Internal Medicine

## 2019-05-16 DIAGNOSIS — Z20822 Contact with and (suspected) exposure to covid-19: Secondary | ICD-10-CM

## 2019-05-18 LAB — SARS-COV-2, NAA 2 DAY TAT

## 2019-05-18 LAB — NOVEL CORONAVIRUS, NAA: SARS-CoV-2, NAA: NOT DETECTED

## 2019-06-27 ENCOUNTER — Telehealth: Payer: Self-pay

## 2019-06-27 NOTE — Telephone Encounter (Signed)
I called and got her scheduled for a fasting CPE on 07/24/19.

## 2019-06-27 NOTE — Telephone Encounter (Signed)
Pt. Called stating that she needs a referral to be able to go back to the [facility] to get her Iron level checked. She tried calling them to schedule an apt. And they told her she would need a referral again since it has been a while since she was seen there.

## 2019-06-27 NOTE — Telephone Encounter (Signed)
I have not seen her in over a year and she has never seen me for a physical.  I recommend a physical and baseline labs to not only address the anemia issue but to address general health care recommendations and screenings

## 2019-07-05 ENCOUNTER — Other Ambulatory Visit: Payer: Self-pay | Admitting: Obstetrics and Gynecology

## 2019-07-05 IMAGING — US US OB COMP LESS 14 WK
1 series · 14 of 28 positions shown · non-contrast
Comparison: CT of the abdomen and pelvis performed 10/31/2015

CLINICAL DATA: Acute onset of vaginal bleeding.

EXAM:
OBSTETRIC <14 WK ULTRASOUND
TECHNIQUE: Transabdominal ultrasound was performed for evaluation of the
gestation as well as the maternal uterus and adnexal regions.

[Series 1: us ob comp less 14 wk · 0.23mm/px · 31 acquisitions, 14 frames shown]
[im 2/31]
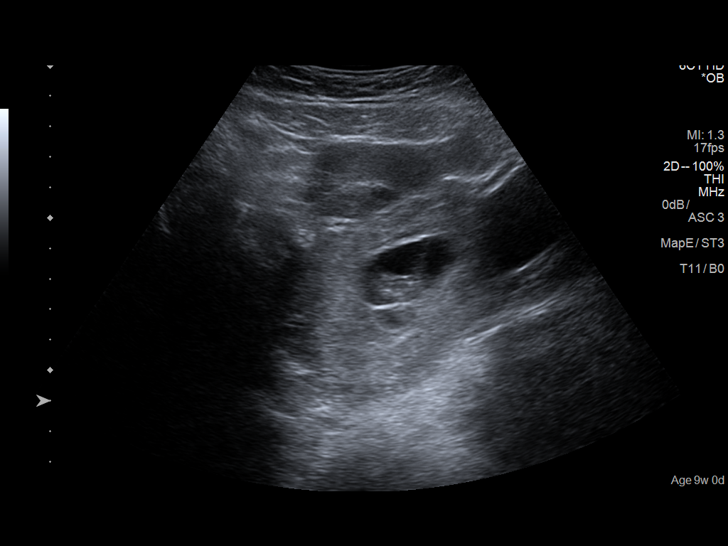
[im 4/31]
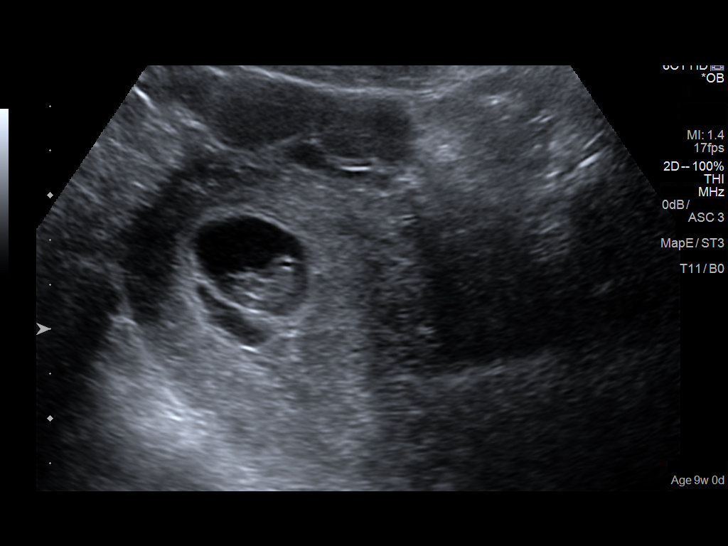
[im 6/31]
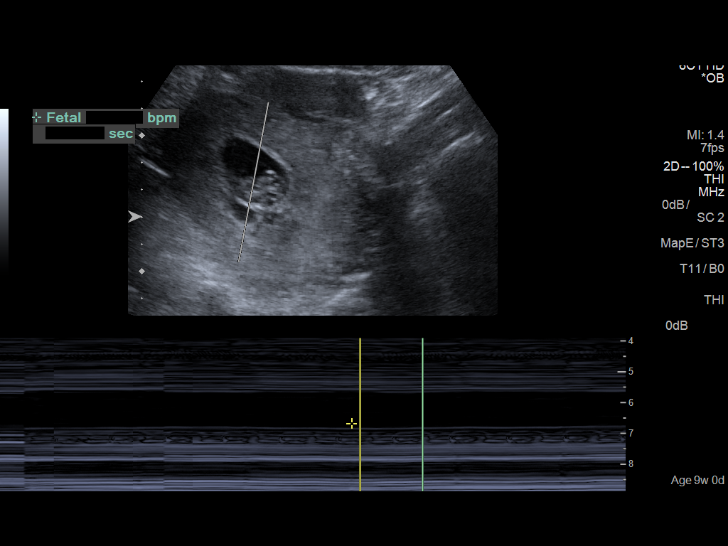
[im 8/31]
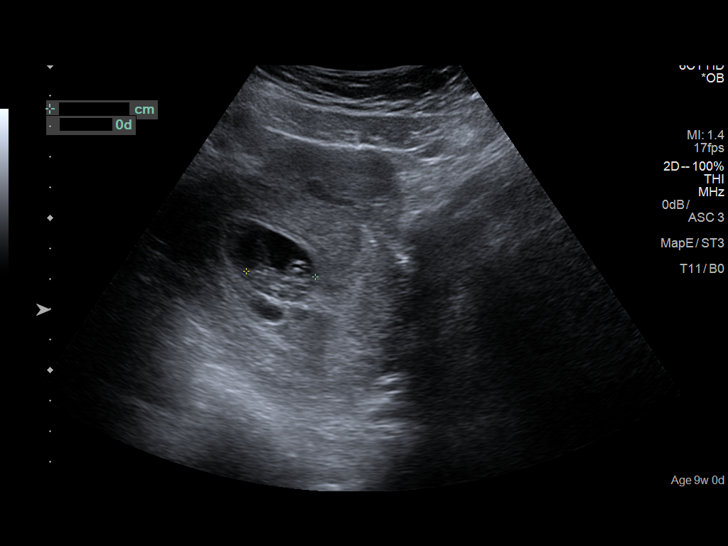
[im 11/31]
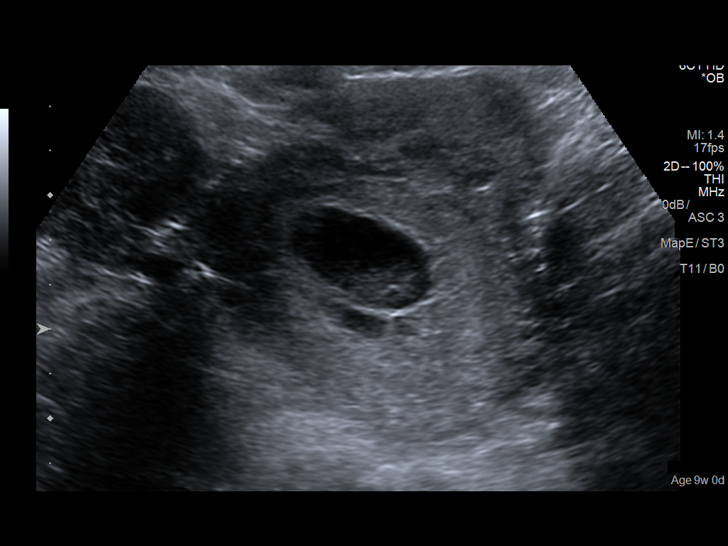
[im 13/31]
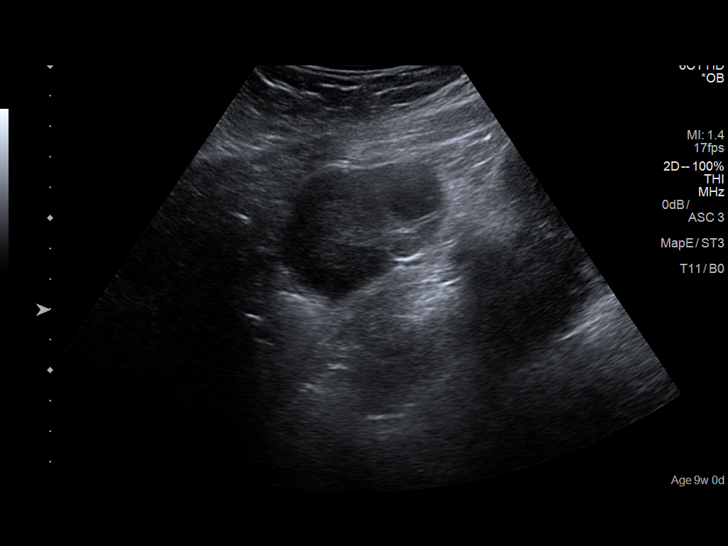
[im 15/31]
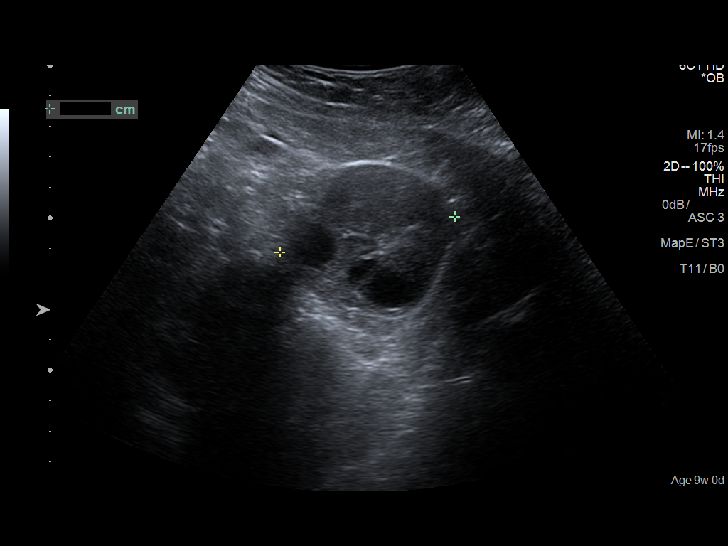
[im 17/31]
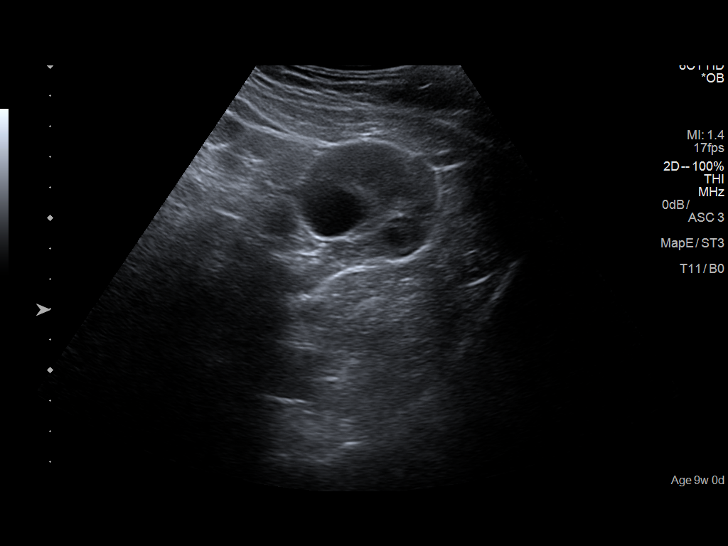
[im 19/31]
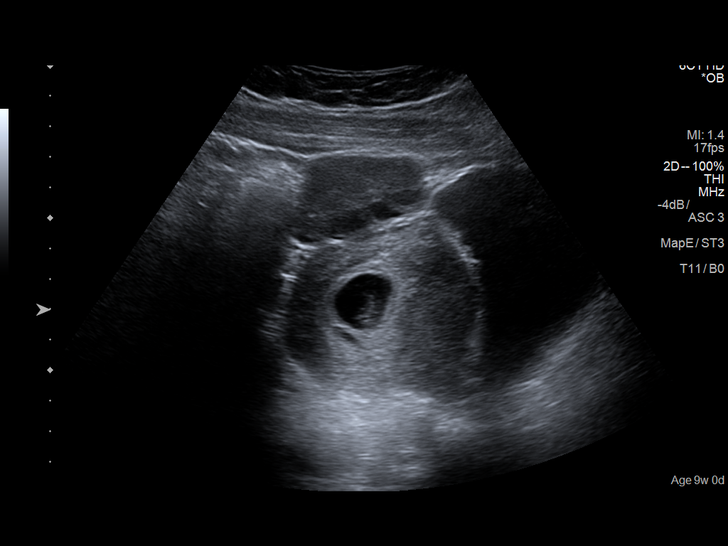
[im 22/31]
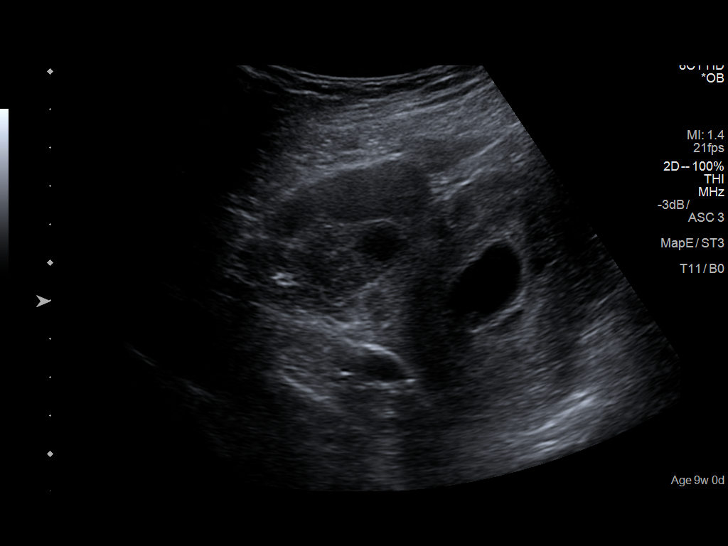
[im 24/31]
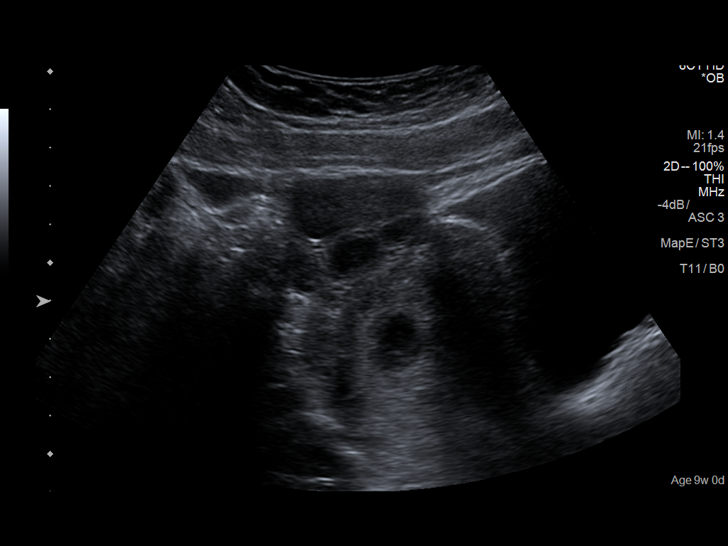
[im 26/31]
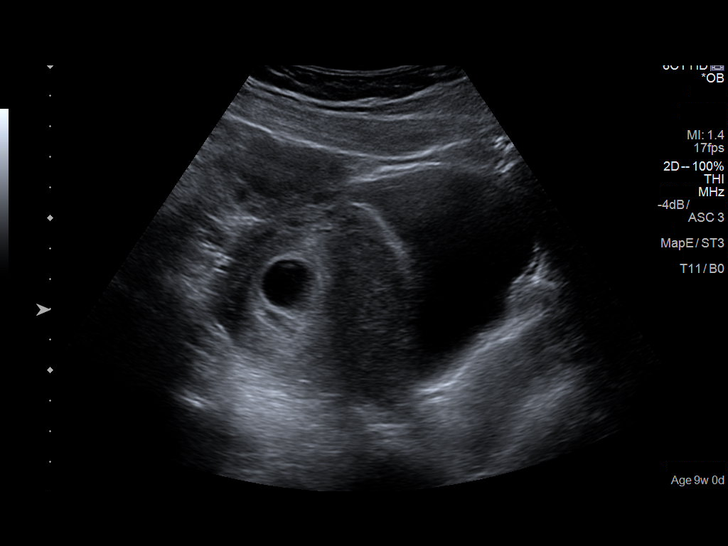
[im 28/31]
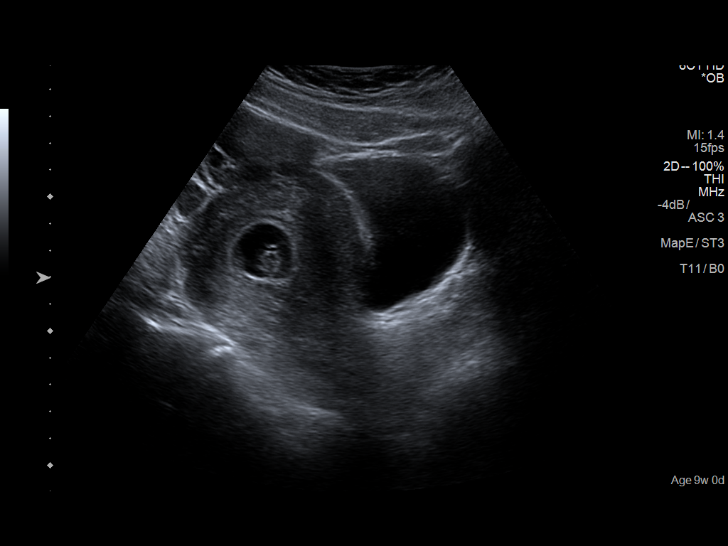
[im 31/31]
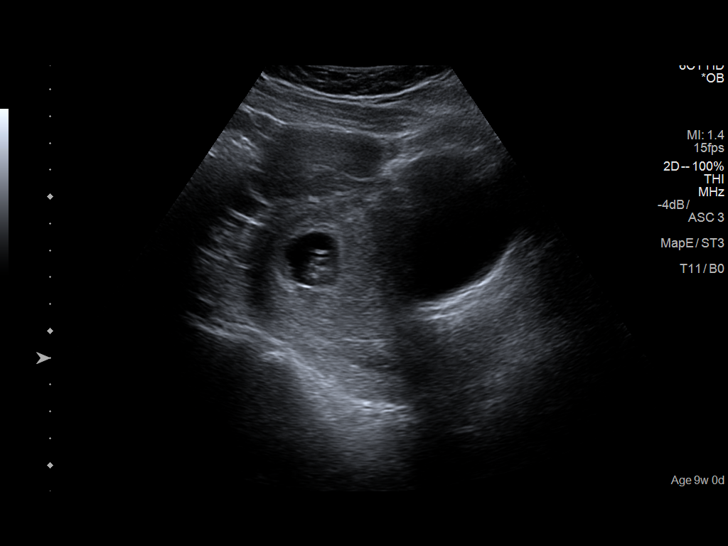

[14 of 28 positions shown; findings below may reference images not displayed]

FINDINGS: Intrauterine gestational sac: Single; visualized and normal in
shape.

Yolk sac:  Yes

Embryo:  Yes

Cardiac Activity: Yes

Heart Rate: 163 bpm

CRL:   2.26 cm   9 w 0 d                  US EDC: 08/19/2016

Subchorionic hemorrhage:  None visualized.

Maternal uterus/adnexae: The uterus is otherwise unremarkable. No
twin pregnancy is currently seen.

The ovaries are grossly unremarkable in appearance, measuring 5.6 x
3.2 x 4.2 cm on the right, and 6.9 x 4.2 x 5.9 cm on the left. No
suspicious adnexal masses are seen; there is no evidence for ovarian
torsion.

No free fluid is seen within the pelvic cul-de-sac.
IMPRESSION: Single live intrauterine pregnancy noted, with a crown-rump length
of 2.3 cm, corresponding to a gestational age of 9 weeks 0 days.
This matches the gestational age by LMP, reflecting an estimated
date of delivery August 19, 2016.

## 2019-07-09 ENCOUNTER — Ambulatory Visit (HOSPITAL_COMMUNITY)
Admission: RE | Admit: 2019-07-09 | Discharge: 2019-07-09 | Disposition: A | Discharge status: Home / Self Care | Payer: Managed Care, Other (non HMO) | Source: Ambulatory Visit | Attending: Internal Medicine | Admitting: Internal Medicine

## 2019-07-09 ENCOUNTER — Other Ambulatory Visit: Payer: Self-pay

## 2019-07-09 DIAGNOSIS — D509 Iron deficiency anemia, unspecified: Secondary | ICD-10-CM | POA: Insufficient documentation

## 2019-07-09 MED ORDER — SODIUM CHLORIDE 0.9 % IV SOLN
INTRAVENOUS | Status: DC | PRN
  Administered 2019-07-09: 250 mL via INTRAVENOUS

## 2019-07-09 MED ORDER — SODIUM CHLORIDE 0.9 % IV SOLN
510.0000 mg | Freq: Once | INTRAVENOUS | Status: AC
  Administered 2019-07-09: 510 mg via INTRAVENOUS
  Filled 2019-07-09: qty 510

## 2019-07-09 NOTE — Discharge Instructions (Signed)

## 2019-07-09 NOTE — Progress Notes (Signed)
PATIENT CARE CENTER NOTE    Provider: [name] Better, MD   Procedure: Feraheme infusion    Note: Patient received Feraheme infusion via PIV. Tolerated well with no adverse reaction. Observed patient for 30 minutes post-infusion. Vital signs stable. Discharge instructions given. Patient to come back next week for second infusion. Alert, oriented and ambulatory at discharge.

## 2019-07-16 ENCOUNTER — Ambulatory Visit (HOSPITAL_COMMUNITY)
Admission: RE | Admit: 2019-07-16 | Discharge: 2019-07-16 | Disposition: A | Discharge status: Home / Self Care | Payer: Managed Care, Other (non HMO) | Source: Ambulatory Visit | Attending: Internal Medicine | Admitting: Internal Medicine

## 2019-07-16 ENCOUNTER — Other Ambulatory Visit: Payer: Self-pay

## 2019-07-16 DIAGNOSIS — D509 Iron deficiency anemia, unspecified: Secondary | ICD-10-CM | POA: Diagnosis not present

## 2019-07-16 MED ORDER — SODIUM CHLORIDE 0.9 % IV SOLN
INTRAVENOUS | Status: DC | PRN
  Administered 2019-07-16: 250 mL via INTRAVENOUS

## 2019-07-16 MED ORDER — SODIUM CHLORIDE 0.9 % IV SOLN
510.0000 mg | Freq: Once | INTRAVENOUS | Status: AC
  Administered 2019-07-16: 510 mg via INTRAVENOUS
  Filled 2019-07-16: qty 510

## 2019-07-16 NOTE — Discharge Instructions (Signed)

## 2019-07-16 NOTE — Progress Notes (Signed)
PATIENT CARE CENTER NOTE:  Patient received IV Feraheme as ordered by [name] Better, MD. Observed for at least 30 minutes post infusion.Tolerated well, vitals stable, discharge instructions given, verbalized understanding. Patient alert, oriented and ambulatory at the time of discharge.

## 2019-07-24 ENCOUNTER — Encounter: Payer: Managed Care, Other (non HMO) | Admitting: Medical

## 2019-09-19 ENCOUNTER — Other Ambulatory Visit: Payer: 59

## 2019-09-19 ENCOUNTER — Other Ambulatory Visit: Payer: Self-pay

## 2019-09-19 DIAGNOSIS — Z20822 Contact with and (suspected) exposure to covid-19: Secondary | ICD-10-CM

## 2019-09-20 ENCOUNTER — Other Ambulatory Visit: Payer: Self-pay

## 2019-09-20 ENCOUNTER — Other Ambulatory Visit: Payer: Medicaid Other

## 2019-09-20 DIAGNOSIS — Z20822 Contact with and (suspected) exposure to covid-19: Secondary | ICD-10-CM

## 2019-09-21 LAB — NOVEL CORONAVIRUS, NAA
SARS-CoV-2, NAA: NOT DETECTED
SARS-CoV-2, NAA: NOT DETECTED

## 2019-09-21 LAB — SARS-COV-2, NAA 2 DAY TAT

## 2019-10-01 ENCOUNTER — Other Ambulatory Visit: Payer: Self-pay

## 2019-10-01 ENCOUNTER — Other Ambulatory Visit: Payer: 59

## 2019-10-01 DIAGNOSIS — Z20822 Contact with and (suspected) exposure to covid-19: Secondary | ICD-10-CM

## 2019-10-02 LAB — NOVEL CORONAVIRUS, NAA: SARS-CoV-2, NAA: NOT DETECTED

## 2019-10-02 LAB — SARS-COV-2, NAA 2 DAY TAT

## 2019-10-08 ENCOUNTER — Other Ambulatory Visit: Payer: Self-pay

## 2019-10-08 ENCOUNTER — Other Ambulatory Visit: Payer: Medicaid Other

## 2019-10-08 DIAGNOSIS — Z20822 Contact with and (suspected) exposure to covid-19: Secondary | ICD-10-CM

## 2019-10-10 LAB — NOVEL CORONAVIRUS, NAA: SARS-CoV-2, NAA: NOT DETECTED

## 2019-10-10 LAB — SARS-COV-2, NAA 2 DAY TAT

## 2019-10-15 ENCOUNTER — Other Ambulatory Visit: Payer: 59

## 2019-11-27 ENCOUNTER — Telehealth: Payer: Managed Care, Other (non HMO) | Admitting: Medical

## 2019-11-27 ENCOUNTER — Encounter: Payer: Self-pay | Admitting: Medical

## 2019-11-27 VITALS — Temp 98.4°F | Ht 64.0 in | Wt 177.0 lb

## 2019-11-27 DIAGNOSIS — Z8616 Personal history of COVID-19: Secondary | ICD-10-CM | POA: Insufficient documentation

## 2019-11-27 DIAGNOSIS — R059 Cough, unspecified: Secondary | ICD-10-CM | POA: Insufficient documentation

## 2019-11-27 MED ORDER — HYDROCODONE-HOMATROPINE 5-1.5 MG/5ML PO SYRP: 5.0000 mL | ORAL_SOLUTION | Freq: Three times a day (TID) | ORAL | 0 refills | Status: AC | PRN

## 2019-11-27 MED ORDER — DOXYCYCLINE HYCLATE 100 MG PO TABS: 100.0000 mg | ORAL_TABLET | Freq: Two times a day (BID) | ORAL | 0 refills | Status: DC

## 2019-11-27 MED ORDER — PREDNISONE 20 MG PO TABS: 40.0000 mg | ORAL_TABLET | Freq: Every day | ORAL | 0 refills | Status: DC

## 2019-11-27 NOTE — Progress Notes (Signed)
Subjective:     Patient ID: Peggy Kelly, female   DOB: 03-20-1990, 29 y.o.   MRN: [id number]  This visit type was conducted due to national recommendations for restrictions regarding the COVID-19 Pandemic (e.g. social distancing) in an effort to limit this patient's exposure and mitigate transmission in our community.  Due to their co-morbid illnesses, this patient is at least at moderate risk for complications without adequate follow up.  This format is felt to be most appropriate for this patient at this time.    Documentation for virtual audio and video telecommunications through [place] encounter:  The patient was located at home. The provider was located in the office. The patient did consent to this visit and is aware of possible charges through their insurance for this visit.  The other persons participating in this telemedicine service were none. Time spent on call was 20 minutes and in review of previous records 20 minutes total.  This virtual service is not related to other E/M service within previous 7 days.   HPI Chief Complaint  Patient presents with  . Cough    x3 weeks   Virtual consult today for cough.  She notes coughing for 2-3 weeks.  She did a consult with another provider last week, was prescribed Z-Pak and eardrops and Tessalon Perles for ear infection cough.  The ear pressure improved, overall felt better but she still has ongoing cough.  She denies current fever, nausea, vomiting, body aches, chills.  No sinus pain.  No wheezing.    Also has a child in daycare and the last couple times she has gotten sick has been from her child giving her the illness.  She is a non-smoker.  No history of asthma.  She did have Covid back in August 2021.  She feels like some of this cough could still be lingering from Covid infection.  Using ginger tea, cough drops.  No other aggravating or relieving factors. No other complaint.  Review of Systems As in subjective     Objective:   Physical Exam Due to coronavirus pandemic stay at home measures, patient visit was virtual and they were not examined in person.   Temp 98.4 F (36.9 C)   Ht 5\' 4"  (1.626 m)   Wt 177 lb (80.3 kg)   BMI 30.38 kg/m       Assessment:     Encounter Diagnoses  Name Primary?  . Cough Yes  . History of COVID-19        Plan:     We discussed limitations of virtual consult.  We discussed symptoms and concerns.  Begin 5 days of prednisone, Hycodan cough syrup as needed.  Cautioned on sedation.  Rest, hydrate well.  If not much improved within the next 7 to 10 days or if worse in the meantime, then call back as we would schedule a chest x-ray and follow-up.  If more colored mucus in the next 2 days she can add doxycycline antibiotic.  We discussed this prescription.   Peggy Kelly was seen today for cough.  Diagnoses and all orders for this visit:  Cough  History of COVID-19  Other orders -     predniSONE (DELTASONE) 20 MG tablet; Take 2 tablets (40 mg total) by mouth daily with breakfast. -     HYDROcodone-homatropine (HYCODAN) 5-1.5 MG/5ML syrup; Take 5 mLs by mouth every 8 (eight) hours as needed for up to 5 days. -     doxycycline (VIBRA-TABS) 100 MG tablet; Take  1 tablet (100 mg total) by mouth 2 (two) times daily.  Follow-up as needed

## 2019-12-26 ENCOUNTER — Telehealth: Payer: Managed Care, Other (non HMO) | Admitting: Medical

## 2019-12-26 ENCOUNTER — Encounter: Payer: Self-pay | Admitting: Medical

## 2019-12-26 ENCOUNTER — Other Ambulatory Visit: Payer: Self-pay

## 2019-12-26 VITALS — Ht 64.0 in | Wt 180.0 lb

## 2019-12-26 DIAGNOSIS — R059 Cough, unspecified: Secondary | ICD-10-CM | POA: Diagnosis not present

## 2019-12-26 DIAGNOSIS — R519 Headache, unspecified: Secondary | ICD-10-CM | POA: Diagnosis not present

## 2019-12-26 DIAGNOSIS — D509 Iron deficiency anemia, unspecified: Secondary | ICD-10-CM

## 2019-12-26 DIAGNOSIS — B999 Unspecified infectious disease: Secondary | ICD-10-CM | POA: Insufficient documentation

## 2019-12-26 NOTE — Patient Instructions (Signed)
Using Saline Nose Drops with Bulb Syringe A bulb syringe is used to clear your nose. You may use it when you have a stuffy nose, nasal congestion, sinus pressure, or sneezing.   SALINE SOLUTION You can buy nose drops at your local drug store. You can also make nose drops yourself. Mix 1 cup of water with  teaspoon of salt. Stir. Store this mixture at room temperature. Make a new batch daily.  USE THE BULB IN COMBINATION WITH SALINE NOSE DROPS  Squeeze the air out of the bulb before suctioning the saline mixture.  While still squeezing the bulb flat, place the tip of the bulb into the saline mixture.  Let air come back into the bulb.  This will suction up the saline mixture.  Gently flush one nostril at a time.  Salt water nose drops will then moisten your  congested nose and loosen secretions before suctioning.  Use the bulb syringe as directed below to suction.  USING THE BULB SYRINGE TO SUCTION  While still squeezing the bulb flat, place the tip of the bulb into a nostril. Let air come back into the bulb. The suction will pull snot out of the nose and into the bulb.  Repeat on the other nostril.  Squeeze syringe several times into a tissue.  CLEANING THE BULB SYRINGE  Clean the bulb syringe every day with hot soapy water.  Clean the inside of the bulb by squeezing the bulb while the tip is in soapy water.  Rinse by squeezing the bulb while the tip is in clean hot water.  Store the bulb with the tip side down on paper towel.  HOME CARE INSTRUCTIONS   Use saline nose drops often to keep the nose open and not stuffy.  Throw away used salt water. Make a new solution every time.  Do not use the same solution and dropper for another person  If you do not prefer to use nasal saline flush, other options include nasal saline spray or the Neti Pot, both of which are available over the counter at your pharmacy.      

## 2019-12-26 NOTE — Progress Notes (Signed)
Subjective:     Patient ID: Peggy Kelly, female   DOB: [birth year], 29 y.o.   MRN: [id number]  This visit type was conducted due to national recommendations for restrictions regarding the COVID-19 Pandemic (e.g. social distancing) in an effort to limit this patient's exposure and mitigate transmission in our community.  Due to their co-morbid illnesses, this patient is at least at moderate risk for complications without adequate follow up.  This format is felt to be most appropriate for this patient at this time.    Documentation for virtual audio and video telecommunications through [place] encounter:  The patient was located at home. The provider was located in the office. The patient did consent to this visit and is aware of possible charges through their insurance for this visit.  The other persons participating in this telemedicine service were none. Time spent on call was 20 minutes and in review of previous records 20 minutes total.  This virtual service is not related to other E/M service within previous 7 days.   HPI Chief Complaint  Patient presents with  . Cough    headache,sinus pressure,congestion,ear popping   Virtual consult for URI symptoms.  She notes 1 day hx/o ear popping, slight headache, sinus congestion, facial pressure.   No fever.  No NVD.  Had sore throat 1 day.  She notes 2 days hx/o of symptoms.  No wheezing, no SOB.  No change in smell or taste.  No sick contacts.  Son had slight cough but he is fine.   Using mucinex, zyrtec, nasonex.    We did consult for respiratory infection a month ago, and that resolved with treatment.   This is new symptoms.     Not pregnant or nursing.  Had some labs recently with gynecology, Dr. Cherly Hensen.    She does note some frequent URIs.   No other aggravating or relieving factors. No other complaint.  Past Medical History:  Diagnosis Date  . Allergy   . Anemia   . Anxiety   . Migraines   . Seasonal allergies    . Vaginal Pap smear, abnormal     Review of Systems As in subjective    Objective:   Physical Exam Due to coronavirus pandemic stay at home measures, patient visit was virtual and they were not examined in person.   Ht 5\' 4"  (1.626 m)   Wt 180 lb (81.6 kg)   BMI 30.90 kg/m       Assessment:     Encounter Diagnoses  Name Primary?  . Cough Yes  . Acute nonintractable headache, unspecified headache type   . Iron deficiency anemia, unspecified iron deficiency anemia type   . Recurrent infections        Plan:     Discussed symptoms, concerns, recommendations.  Currently symptoms suggest viral respiratory tract infection.     Recommendations:  Continue Zyrtec allergy pill daily at bedtime.  Of if you have been on this a while, consider changing to Xyzal or Allergra.  Sometime people get tolerant to one antihistamine and change to another one for a while  Continue Nasonex allergy nasal spray daily  Begin Sudafed /pseudoephedrine for congestion and sinus pressure x 5 days  You can continue Mucinex expectorant  Consider Tylenol if pain or sinus pressure  Add nasal saline flush or neti pot  If needed, if you can't breath through nose at bedtime due to congestion, you can use Afrin nasal spray at bedtime for up to 3 nights.  This can be hard to get off if you use it for more than 3-4 days at a time. So use this sparingly for immediate relief only  If not much improved by next week such as worse head pressure, green mucous, or other, then call back  Send me a copy of your recent labs.  Of note, she had recent IV iron infusion  Per patient reviewing her lab portal from gyn, Recent fasting glucose 97  F/u soon for fasting labs and physical  [name] was seen today for cough.  Diagnoses and all orders for this visit:  Cough  Acute nonintractable headache, unspecified headache type  Iron deficiency anemia, unspecified iron deficiency anemia type  Recurrent  infections

## 2020-01-27 ENCOUNTER — Other Ambulatory Visit: Payer: Self-pay

## 2020-01-27 ENCOUNTER — Other Ambulatory Visit: Payer: 59

## 2020-02-17 ENCOUNTER — Other Ambulatory Visit: Payer: 59

## 2020-02-17 ENCOUNTER — Other Ambulatory Visit: Payer: Self-pay

## 2020-02-17 DIAGNOSIS — Z20822 Contact with and (suspected) exposure to covid-19: Secondary | ICD-10-CM

## 2020-02-18 LAB — SARS-COV-2, NAA 2 DAY TAT

## 2020-02-18 LAB — NOVEL CORONAVIRUS, NAA: SARS-CoV-2, NAA: NOT DETECTED

## 2020-03-25 ENCOUNTER — Encounter: Payer: Self-pay | Admitting: Family Medicine

## 2020-03-25 ENCOUNTER — Telehealth: Payer: Managed Care, Other (non HMO) | Admitting: Family Medicine

## 2020-03-25 ENCOUNTER — Other Ambulatory Visit: Payer: Self-pay

## 2020-03-25 VITALS — Temp 97.7°F | Ht 64.0 in | Wt 180.0 lb

## 2020-03-25 DIAGNOSIS — J019 Acute sinusitis, unspecified: Secondary | ICD-10-CM | POA: Diagnosis not present

## 2020-03-25 DIAGNOSIS — J309 Allergic rhinitis, unspecified: Secondary | ICD-10-CM

## 2020-03-25 MED ORDER — AZITHROMYCIN 250 MG PO TABS: ORAL_TABLET | ORAL | 0 refills | Status: DC

## 2020-03-25 MED ORDER — FLUCONAZOLE 150 MG PO TABS: 150.0000 mg | ORAL_TABLET | Freq: Once | ORAL | 0 refills | Status: AC

## 2020-03-25 NOTE — Patient Instructions (Signed)
I recommend doing sinuses rinses once or twice daily Resume Mucinex Fast-max and take as often as directed. Continue your allergy medications. Stay well hydrated--drink lots of water.  If symptoms aren't improved in the next 24 hours, go ahead and start the antibiotics, and take for the full course. Take the diflucan only if/when yeast infection symptoms start . You may take the second pill ONE WEEK LATER, only if you have persistent yeast infection symptoms (usually not needed)  Contact us day 10 or 11 if you aren't 100% better, to get a refill on the zpak. Contact us on day 5 if zero better, to have the antibiotic changed.  Restart a prenatal vitamin since you are no longer using contraception.  Good luck with your move--I hope you feel better soon!

## 2020-03-25 NOTE — Progress Notes (Signed)
Start time: 12:19 End time: 12:46  Virtual Visit via Video Note  I connected with Peggy Kelly on 03/25/20 by a video enabled telemedicine application and verified that I am speaking with the correct person using two identifiers.  Location: Patient: home Provider: office   I discussed the limitations of evaluation and management by telemedicine and the availability of in person appointments. The patient expressed understanding and agreed to proceed.  History of Present Illness:  Chief Complaint  Patient presents with  . Sore Throat    VIRTUAL sore throat, nasal congestion and severe HA. Woke up Sat and did not have a voice. Symptoms started Thursday. HA is on and off. Took at home covid test and was negative-Sunday. Also has a slight cough, mucus is green in color.   She woke up with a headache 6 days ago, feels like a pressure across her forehead. This feels different than her typical migraines.  She also started with mild sore throat 6 days ago (like a tickle). The next 2 days were worse, developed laryngitis.  Congestion started the following day (3-4 days ago).  Mucus from nose is green, not much, mostly feels stuffy.  +PND, phlegm is green.  Not too much cough, just some at night.  Denies shortness of breath or wheezing.  Pt with allergies, and h/o sinus infections. Taking zyrtec and nasonex chronically.  She tried Mucinex cold, flu and sore throat--took 3-4 doses, didn't notice improvement.  (acetaminophen, dextromethorphan, guaifenesin, phenylephrine)  Moving to [place] on Saturday. Not dusty with packing (has bothered her in the past, not now). Weather changes usually contribute to her allergies and sinus infections.  Denies any sick contacts.  Has a 31 yo (had a slight cold 2 weeks ago, resolved).  Not using contraception, not using condoms LMP 2/18  z-paks are usually effective, last when she had COVID in 09/2019 Gets yeast infections with ABX.  PMH, PSH, SH  reviewed  Outpatient Encounter Medications as of 03/25/2020  Medication Sig Note  . acetaminophen (TYLENOL) 500 MG tablet Take 500 mg by mouth every 6 (six) hours as needed. 03/25/2020: Last dose 9:00am  . cetirizine (ZYRTEC) 10 MG tablet Take 10 mg by mouth daily.   . cholecalciferol (VITAMIN D3) 25 MCG (1000 UNIT) tablet Take 1,000 Units by mouth daily.   . fluocinolone (SYNALAR) 0.01 % external solution Apply topically daily.   Marland Kitchen ketoconazole (NIZORAL) 2 % shampoo Apply topically.   . mometasone (NASONEX) 50 MCG/ACT nasal spray Place 2 sprays into the nose daily.   . sertraline (ZOLOFT) 50 MG tablet Take 50 mg by mouth daily.   Marland Kitchen triamcinolone (KENALOG) 0.1 % Apply 1 application topically 2 (two) times daily.   Marland Kitchen Phenylephrine-DM-GG-APAP (MUCINEX FAST-MAX COLD FLU) 5-10-200-325 MG/10ML LIQD Take by mouth. (Patient not taking: Reported on 03/25/2020)    Facility-Administered Encounter Medications as of 03/25/2020  Medication  . alteplase (CATHFLO ACTIVASE) injection 2 mg  . heparin lock flush 100 unit/mL  . heparin lock flush 100 unit/mL  . sodium chloride 0.9 % injection 10 mL  . sodium chloride 0.9 % injection 3 mL   Allergies  Allergen Reactions  . Penicillins Hives    Patient doesn't remember what type of reaction. Has patient had a PCN reaction causing immediate rash, facial/tongue/throat swelling, SOB or lightheadedness with hypotension: No Has patient had a PCN reaction causing severe rash involving mucus membranes or skin necrosis: No Has patient had a PCN reaction that required hospitalization: No Has patient had  a PCN reaction occurring within the last 10 years: No If all of the above answers are "NO", then may proceed with Cephalosporin use.    ROS: no fever, chills, nausea, vomiting, diarrhea. +headache, head congestions, PND, cough.  No rashes or other complaints. Not currently having any vaginal discharge or itching.   Had COVID in 09/2019.     Observations/Objective:  Temp 97.7 F (36.5 C) (Tympanic)   Ht 5\' 4"  (1.626 m)   Wt 180 lb (81.6 kg)   LMP 03/13/2020 (Exact Date)   Breastfeeding No   BMI 30.90 kg/m   Pleasant, well-appearing female, in good spirits, in no distress She is alert and oriented, EOMI. Cranial nerves grossly intact. She has occasional throat clearing, no coughing Exam is limited due to virtual nature of the visit.    Assessment and Plan:  Acute non-recurrent sinusitis, unspecified location - Plan: azithromycin (ZITHROMAX) 250 MG tablet  Allergic rhinitis, unspecified seasonality, unspecified trigger   I recommend doing sinuses rinses once or twice daily Resume Mucinex Fast-max and take as often as directed. Continue your allergy medications. Stay well hydrated--drink lots of water.  If symptoms aren't improved in the next 24 hours, go ahead and start the antibiotics, and take for the full course. Take the diflucan only if/when yeast infection symptoms start . You may take the second pill ONE WEEK LATER, only if you have persistent yeast infection symptoms (usually not needed)  Contact 03/15/2020 day 10 or 11 if you aren't 100% better, to get a refill on the zpak. Contact us on day 5 if zero better, to have the antibiotic changed.  Restart a prenatal vitamin since you are no longer using contraception.  Good luck with your move--I hope you feel better soon!   Follow Up Instructions:    I discussed the assessment and treatment plan with the patient. The patient was provided an opportunity to ask questions and all were answered. The patient agreed with the plan and demonstrated an understanding of the instructions.   The patient was advised to call back or seek an in-person evaluation if the symptoms worsen or if the condition fails to improve as anticipated.  I spent 32 minutes dedicated to the care of this patient, including pre-visit review of records, face to face time, post-visit ordering of  testing and documentation.  [place], MD

## 2020-06-09 ENCOUNTER — Encounter: Payer: Self-pay | Admitting: Family Medicine

## 2021-07-20 ENCOUNTER — Encounter: Payer: Self-pay | Admitting: Oncology

## 2021-07-21 ENCOUNTER — Encounter: Payer: Self-pay | Admitting: Oncology

## 2021-07-23 ENCOUNTER — Ambulatory Visit: Payer: Self-pay | Admitting: Medical

## 2021-09-29 ENCOUNTER — Encounter: Payer: Self-pay | Admitting: Internal Medicine

## 2021-11-02 ENCOUNTER — Encounter: Payer: Self-pay | Admitting: Internal Medicine

## 2021-12-24 ENCOUNTER — Encounter: Payer: Self-pay | Admitting: Oncology

## 2021-12-27 DIAGNOSIS — L600 Ingrowing nail: Secondary | ICD-10-CM | POA: Diagnosis not present

## 2022-01-05 LAB — HM PAP SMEAR: HM Pap smear: NEGATIVE

## 2022-01-06 LAB — CYTOLOGY - PAP: Pap: NEGATIVE

## 2022-01-11 ENCOUNTER — Encounter: Payer: Self-pay | Admitting: Medical

## 2022-01-11 ENCOUNTER — Ambulatory Visit (INDEPENDENT_AMBULATORY_CARE_PROVIDER_SITE_OTHER): Payer: Managed Care, Other (non HMO) | Admitting: Medical

## 2022-01-11 ENCOUNTER — Encounter: Payer: Self-pay | Admitting: Oncology

## 2022-01-11 VITALS — BP 118/72 | HR 80 | Ht 65.0 in | Wt 182.2 lb

## 2022-01-11 DIAGNOSIS — Z1329 Encounter for screening for other suspected endocrine disorder: Secondary | ICD-10-CM | POA: Diagnosis not present

## 2022-01-11 DIAGNOSIS — L309 Dermatitis, unspecified: Secondary | ICD-10-CM

## 2022-01-11 DIAGNOSIS — R5383 Other fatigue: Secondary | ICD-10-CM | POA: Diagnosis not present

## 2022-01-11 DIAGNOSIS — Z0000 Encounter for general adult medical examination without abnormal findings: Secondary | ICD-10-CM | POA: Insufficient documentation

## 2022-01-11 DIAGNOSIS — Z13220 Encounter for screening for lipoid disorders: Secondary | ICD-10-CM | POA: Insufficient documentation

## 2022-01-11 DIAGNOSIS — D649 Anemia, unspecified: Secondary | ICD-10-CM | POA: Insufficient documentation

## 2022-01-11 DIAGNOSIS — L219 Seborrheic dermatitis, unspecified: Secondary | ICD-10-CM | POA: Diagnosis not present

## 2022-01-11 MED ORDER — FLUOCINOLONE ACETONIDE 0.01 % EX SOLN: Freq: Every day | CUTANEOUS | 1 refills | Status: DC

## 2022-01-11 MED ORDER — KETOCONAZOLE 2 % EX SHAM: MEDICATED_SHAMPOO | CUTANEOUS | 1 refills | Status: DC

## 2022-01-11 MED ORDER — VITAMIN D (ERGOCALCIFEROL) 1.25 MG (50000 UNIT) PO CAPS: 50000.0000 [IU] | ORAL_CAPSULE | ORAL | 3 refills | Status: DC

## 2022-01-11 MED ORDER — TRIAMCINOLONE ACETONIDE 0.1 % EX CREA: 1.0000 | TOPICAL_CREAM | Freq: Two times a day (BID) | CUTANEOUS | 1 refills | Status: DC

## 2022-01-11 MED ORDER — TANDEM PLUS 162-115.2-1 MG PO CAPS: 1.0000 | ORAL_CAPSULE | Freq: Every day | ORAL | 3 refills | Status: DC

## 2022-01-11 NOTE — Progress Notes (Signed)
Subjective:   HPI  Peggy Kelly is a 31 y.o. female who presents for Chief Complaint  Patient presents with   Annual Exam    Fasting annual exam, could not give urine. Had annual with Dr. Cherly Hensen last week and she did vit d and iron levels-everything was low. Has been having migraines for years-was going to [facility] and stopped going. Has been very fatigued lately. Has had IV iron infusions in the past. Also asking if you would refill 3 rx's that she typically gets from derm.    Patient Care Team: Ghina Bittinger, Kermit Balo, PA-C as PCP - General (Family Medicine) Zelphia Cairo, MD as Consulting Physician (Obstetrics and Gynecology) Dr. [name] Better, gyn Sees dentist Sees eye doctor  Concerns: Primary concern today for fatigue.  She has history of chronic anemia, chronic iron deficiency anemia with multiple dosing of IV iron in the past.  She does have heavy periods but no history of fibroids.  She sees gynecology.  She had blood drawn this past week showing anemia.  She declines birth control or hormonal contraception.  She still may want to get pregnant again going forward  Last vaccines through college admissions.  Last tetanus  05/25/2017.  She just had a Pap smear that was normal to Dr. Cherly Hensen.  I reviewed labs on her phone from 01/05/2022 showing hemoglobin 9.7, low indices, normal white blood cells and platelets.  Vitamin D was a little low as well  She has history of chronic migraines, was [facility]  She has seen dermatology in the past but would like a refill on cream that she uses for eczema and eczema findings of the scalp and neck  Reviewed their medical, surgical, family, social, medication, and allergy history and updated chart as appropriate.  Past Medical History:  Diagnosis Date   Allergy    Anemia    Anxiety    Migraines    Seasonal allergies    Vaginal Pap smear, abnormal     Family History  Problem  Relation Age of Onset   Thyroid disease Mother    Migraines Sister    Other Sister        anti NMDA receptor encephalitis   Thyroid disease Maternal Grandmother    Cancer Maternal Grandfather        prostate     Current Outpatient Medications:    acetaminophen (TYLENOL) 500 MG tablet, Take 500 mg by mouth every 6 (six) hours as needed., Disp: , Rfl:    FeFum-FePo-FA-B Cmp-C-Zn-Mn-Cu (TANDEM PLUS) 162-115.2-1 MG CAPS, Take 1 capsule by mouth daily., Disp: 90 capsule, Rfl: 3   ibuprofen (ADVIL) 200 MG tablet, Take 800 mg by mouth every 6 (six) hours as needed., Disp: , Rfl:    mometasone (NASONEX) 50 MCG/ACT nasal spray, Place 2 sprays into the nose daily., Disp: , Rfl:    sertraline (ZOLOFT) 50 MG tablet, Take 50 mg by mouth daily., Disp: , Rfl:    Vitamin D, Ergocalciferol, (DRISDOL) 1.25 MG (50000 UNIT) CAPS capsule, Take 1 capsule (50,000 Units total) by mouth every 7 (seven) days., Disp: 12 capsule, Rfl: 3   cetirizine (ZYRTEC) 10 MG tablet, Take 10 mg by mouth daily. (Patient not taking: Reported on 01/11/2022), Disp: , Rfl:    cholecalciferol (VITAMIN D3) 25 MCG (1000 UNIT) tablet, Take 1,000 Units by mouth daily. (Patient not taking: Reported on 01/11/2022), Disp: , Rfl:    fluocinolone (SYNALAR) 0.01 % external solution,  Apply topically daily., Disp: 60 mL, Rfl: 1   [START ON 01/13/2022] ketoconazole (NIZORAL) 2 % shampoo, Apply topically 2 (two) times a week., Disp: 120 mL, Rfl: 1   triamcinolone cream (KENALOG) 0.1 %, Apply 1 Application topically 2 (two) times daily., Disp: 45 g, Rfl: 1 No current facility-administered medications for this visit.  Facility-Administered Medications Ordered in Other Visits:    alteplase (CATHFLO ACTIVASE) injection 2 mg, 2 mg, Intracatheter, Once PRN, Shadad, Blenda Nicely, MD   heparin lock flush 100 unit/mL, 500 Units, Intracatheter, Once PRN, Shadad, Blenda Nicely, MD   heparin lock flush 100 unit/mL, 250 Units, Intracatheter, Once PRN, Shadad, Blenda Nicely,  MD   sodium chloride 0.9 % injection 10 mL, 10 mL, Intracatheter, PRN, Shadad, Blenda Nicely, MD   sodium chloride 0.9 % injection 3 mL, 3 mL, Intravenous, Once PRN, Clelia Croft, Blenda Nicely, MD  Allergies  Allergen Reactions   Penicillins Hives    Patient doesn't remember what type of reaction. Has patient had a PCN reaction causing immediate rash, facial/tongue/throat swelling, SOB or lightheadedness with hypotension: No Has patient had a PCN reaction causing severe rash involving mucus membranes or skin necrosis: No Has patient had a PCN reaction that required hospitalization: No Has patient had a PCN reaction occurring within the last 10 years: No If all of the above answers are "NO", then may proceed with Cephalosporin use.     Review of Systems  Constitutional:  Positive for malaise/fatigue. Negative for chills, fever and weight loss.  HENT:  Negative for congestion, ear pain, hearing loss, sore throat and tinnitus.   Eyes:  Negative for blurred vision, pain and redness.  Respiratory:  Negative for cough, hemoptysis and shortness of breath.   Cardiovascular:  Negative for chest pain, palpitations, orthopnea, claudication and leg swelling.  Gastrointestinal:  Negative for abdominal pain, blood in stool, constipation, diarrhea, nausea and vomiting.  Genitourinary:  Negative for dysuria, flank pain, frequency, hematuria and urgency.  Musculoskeletal:  Negative for falls, joint pain and myalgias.  Skin:  Negative for itching and rash.       eczema  Neurological:  Positive for headaches. Negative for dizziness, tingling, speech change and weakness.  Endo/Heme/Allergies:  Negative for polydipsia. Does not bruise/bleed easily.  Psychiatric/Behavioral:  Negative for depression and memory loss. The patient is not nervous/anxious and does not have insomnia.          01/11/2022   12:12 PM 03/22/2018   10:59 AM 06/06/2016    3:20 PM 05/25/2016    2:09 PM  Depression screen PHQ 2/9  Decreased Interest  0 0 0 0  Down, Depressed, Hopeless 0 0 0 0  PHQ - 2 Score 0 0 0 0       Objective:  BP 118/72   Pulse 80   Ht 5\' 5"  (1.651 m)   Wt 182 lb 3.2 oz (82.6 kg)   LMP 01/08/2022   BMI 30.32 kg/m   General appearance: alert, no distress, WD/WN, African American female Skin: Faint brownish rough skin on the bilateral neck in the supraclavicular region, no other rash today, tattoo right volar wrist, tattoos on abdomen, no other worrisome lesions HEENT: normocephalic, conjunctiva/corneas normal, sclerae anicteric, PERRLA, EOMi, nares patent, no discharge or erythema, pharynx normal Oral cavity: MMM, tongue normal, teeth normal Neck: supple, no lymphadenopathy, no thyromegaly, no masses, normal ROM, no bruits Chest: non tender, normal shape and expansion Heart: RRR, normal S1, S2, no murmurs Lungs: CTA bilaterally, no wheezes, rhonchi, or rales  Abdomen: +bs, soft, non tender, non distended, no masses, no hepatomegaly, no splenomegaly, no bruits Back: non tender, normal ROM, no scoliosis Musculoskeletal: upper extremities non tender, no obvious deformity, normal ROM throughout, lower extremities non tender, no obvious deformity, normal ROM throughout Extremities: no edema, no cyanosis, no clubbing Pulses: 2+ symmetric, upper and lower extremities, normal cap refill Neurological: alert, oriented x 3, CN2-12 intact, strength normal upper extremities and lower extremities, sensation normal throughout, DTRs 2+ throughout, no cerebellar signs, gait normal Psychiatric: normal affect, behavior normal, pleasant  Breast/gyn/rectal - deferred to gynecology      Assessment and Plan :   Encounter Diagnoses  Name Primary?   Encounter for health maintenance examination in adult Yes   Anemia, unspecified type    Eczema, unspecified type    Seborrheic eczema of scalp    Screening for lipid disorders    Screening for thyroid disorder    Fatigue, unspecified type      This visit was a preventative  care visit, also known as wellness visit or routine physical.   Topics typically include healthy lifestyle, diet, exercise, preventative care, vaccinations, sick and well care, proper use of emergency dept and after hours care, as well as other concerns.     Recommendations: Continue to return yearly for your annual wellness and preventative care visits.  This gives [place] a chance to discuss healthy lifestyle, exercise, vaccinations, review your chart record, and perform screenings where appropriate.  I recommend you see your eye doctor yearly for routine vision care.  I recommend you see your dentist yearly for routine dental care including hygiene visits twice yearly.  See your gynecologist yearly for routine gynecological care.   Vaccination recommendations were reviewed Immunization History  Administered Date(s) Administered   Influenza-Unspecified 10/21/2021   PFIZER(Purple Top)SARS-COV-2 Vaccination 09/15/2019, 10/06/2019   Pfizer Covid-19 Vaccine Bivalent Booster 86yrs & up 07/22/2021     Screening for cancer: Colon cancer screening: Age 45yo, possible other eval depending upon labs and eval today for anemia  Breast cancer screening: You should perform a self breast exam monthly.   We reviewed recommendations for regular mammograms and breast cancer screening.  Cervical cancer screening: We reviewed recommendations for pap smear screening.   Skin cancer screening: Check your skin regularly for new changes, growing lesions, or other lesions of concern Come in for evaluation if you have skin lesions of concern.  Lung cancer screening: If you have a greater than 20 pack year history of tobacco use, then you may qualify for lung cancer screening with a chest CT scan.   Please call your insurance company to inquire about coverage for this test.  We currently don't have screenings for other cancers besides breast, cervical, colon, and lung cancers.  If you have a strong family  history of cancer or have other cancer screening concerns, please let me know.    Bone health: Get at least 150 minutes of aerobic exercise weekly Get weight bearing exercise at least once weekly Bone density test:  A bone density test is an imaging test that uses a type of X-ray to measure the amount of calcium and other minerals in your bones. The test may be used to diagnose or screen you for a condition that causes weak or thin bones (osteoporosis), predict your risk for a broken bone (fracture), or determine how well your osteoporosis treatment is working. The bone density test is recommended for females 65 and older, or females or males <65 if certain risk  factors such as thyroid disease, long term use of steroids such as for asthma or rheumatological issues, vitamin D deficiency, estrogen deficiency, family history of osteoporosis, self or family history of fragility fracture in first degree relative.    Heart health: Get at least 150 minutes of aerobic exercise weekly Limit alcohol It is important to maintain a healthy blood pressure and healthy cholesterol numbers  Heart disease screening: Screening for heart disease includes screening for blood pressure, fasting lipids, glucose/diabetes screening, BMI height to weight ratio, reviewed of smoking status, physical activity, and diet.    Goals include blood pressure 120/80 or less, maintaining a healthy lipid/cholesterol profile, preventing diabetes or keeping diabetes numbers under good control, not smoking or using tobacco products, exercising most days per week or at least 150 minutes per week of exercise, and eating healthy variety of fruits and vegetables, healthy oils, and avoiding unhealthy food choices like fried food, fast food, high sugar and high cholesterol foods.    Other tests may possibly include EKG test, CT coronary calcium score, echocardiogram, exercise treadmill stress test.    Medical care options: I recommend  you continue to seek care here first for routine care.  We try really hard to have available appointments Monday through Friday daytime hours for sick visits, acute visits, and physicals.  Urgent care should be used for after hours and weekends for significant issues that cannot wait till the next day.  The emergency department should be used for significant potentially life-threatening emergencies.  The emergency department is expensive, can often have long wait times for less significant concerns, so try to utilize primary care, urgent care, or telemedicine when possible to avoid unnecessary trips to the emergency department.  Virtual visits and telemedicine have been introduced since the pandemic started in 2020, and can be convenient ways to receive medical care.  We offer virtual appointments as well to assist you in a variety of options to seek medical care.   Advanced Directives: I recommend you consider completing a Health Care Power of Attorney and Living Will.   These documents respect your wishes and help alleviate burdens on your loved ones if you were to become terminally ill or be in a position to need those documents enforced.    You can complete Advanced Directives yourself, have them notarized, then have copies made for our office, for you and for anybody you feel should have them in safe keeping.  Or, you can have an attorney prepare these documents.   If you haven't updated your Last Will and Testament in a while, it may be worthwhile having an attorney prepare these documents together and save on some costs.       Separate significant issues discussed: Anemia Begin tandem iron.  She has failed other over-the-counter iron preparations in the past including further iron ferrous sulfate We will likely pursue IV iron therapy as well Additional labs today Fecal stool test for blood today as well sent home with her  Eczema Use daily moisturizing lotion Use triamcinolone for flareup  of rash on skin but not face Use the Nizoral shampoo twice a week for several weeks when having worse problems Use the fluocinolone solution as needed for scalp dermatitis She has similar area using these regularly in the past  Fatigue Additional labs today Likely related to anemia, chronic iron deficiency anemia    Rachyl was seen today for annual exam.  Diagnoses and all orders for this visit:  Encounter for health maintenance examination  in adult -     Comprehensive metabolic panel -     Iron, TIBC and Ferritin Panel -     Lipid panel -     TSH -     Hepatitis C antibody -     Folate -     Vitamin B12 -     Fecal occult blood, imunochemical([facility])  Anemia, unspecified type -     Iron, TIBC and Ferritin Panel -     Folate -     Vitamin B12 -     Fecal occult blood, imunochemical([facility])  Eczema, unspecified type  Seborrheic eczema of scalp  Screening for lipid disorders -     Lipid panel  Screening for thyroid disorder -     TSH  Fatigue, unspecified type -     Iron, TIBC and Ferritin Panel -     TSH  Other orders -     fluocinolone (SYNALAR) 0.01 % external solution; Apply topically daily. -     triamcinolone cream (KENALOG) 0.1 %; Apply 1 Application topically 2 (two) times daily. -     ketoconazole (NIZORAL) 2 % shampoo; Apply topically 2 (two) times a week. -     FeFum-FePo-FA-B Cmp-C-Zn-Mn-Cu (TANDEM PLUS) 162-115.2-1 MG CAPS; Take 1 capsule by mouth daily. -     Vitamin D, Ergocalciferol, (DRISDOL) 1.25 MG (50000 UNIT) CAPS capsule; Take 1 capsule (50,000 Units total) by mouth every 7 (seven) days.   Follow-up pending labs, yearly for physical

## 2022-01-12 LAB — COMPREHENSIVE METABOLIC PANEL
ALT: 17 IU/L (ref 0–32)
AST: 20 IU/L (ref 0–40)
Albumin/Globulin Ratio: 1.4 (ref 1.2–2.2)
Albumin: 4 g/dL (ref 3.9–4.9)
Alkaline Phosphatase: 61 IU/L (ref 44–121)
BUN/Creatinine Ratio: 15 (ref 9–23)
BUN: 10 mg/dL (ref 6–20)
Bilirubin Total: 0.3 mg/dL (ref 0.0–1.2)
CO2: 22 mmol/L (ref 20–29)
Calcium: 9 mg/dL (ref 8.7–10.2)
Chloride: 106 mmol/L (ref 96–106)
Creatinine, Ser: 0.66 mg/dL (ref 0.57–1.00)
Globulin, Total: 2.9 g/dL (ref 1.5–4.5)
Glucose: 82 mg/dL (ref 70–99)
Potassium: 4.4 mmol/L (ref 3.5–5.2)
Sodium: 140 mmol/L (ref 134–144)
Total Protein: 6.9 g/dL (ref 6.0–8.5)
eGFR: 119 mL/min/{1.73_m2} (ref >59)

## 2022-01-12 LAB — IRON,TIBC AND FERRITIN PANEL
Ferritin: 9 ng/mL — ABNORMAL LOW (ref 15–150)
Iron Saturation: 13 % — ABNORMAL LOW (ref 15–55)
Iron: 45 ug/dL (ref 27–159)
Total Iron Binding Capacity: 348 ug/dL (ref 250–450)
UIBC: 303 ug/dL (ref 131–425)

## 2022-01-12 LAB — LIPID PANEL
Chol/HDL Ratio: 2.4 ratio (ref 0.0–4.4)
Cholesterol, Total: 151 mg/dL (ref 100–199)
HDL: 64 mg/dL (ref >39)
LDL Chol Calc (NIH): 78 mg/dL (ref 0–99)
Triglycerides: 40 mg/dL (ref 0–149)
VLDL Cholesterol Cal: 9 mg/dL (ref 5–40)

## 2022-01-12 LAB — HEPATITIS C ANTIBODY: Hep C Virus Ab: NONREACTIVE

## 2022-01-12 LAB — FOLATE: Folate: 8.2 ng/mL (ref >3.0)

## 2022-01-12 LAB — VITAMIN B12: Vitamin B-12: 513 pg/mL (ref 232–1245)

## 2022-01-12 LAB — TSH: TSH: 0.73 u[IU]/mL (ref 0.450–4.500)

## 2022-01-12 NOTE — Progress Notes (Signed)
Results sent through MyChart

## 2022-01-13 ENCOUNTER — Other Ambulatory Visit: Payer: Self-pay | Admitting: Medical

## 2022-01-13 ENCOUNTER — Telehealth: Payer: Self-pay | Admitting: Internal Medicine

## 2022-01-13 DIAGNOSIS — D649 Anemia, unspecified: Secondary | ICD-10-CM

## 2022-01-13 MED ORDER — POLYSACCHARIDE IRON COMPLEX 150 MG PO CAPS: 150.0000 mg | ORAL_CAPSULE | Freq: Every day | ORAL | 1 refills | Status: DC

## 2022-01-13 NOTE — Telephone Encounter (Signed)
Would like to do Iron infusion as well with iron medication. Ok to set this up

## 2022-01-13 NOTE — Telephone Encounter (Signed)
P.A. Tandem Plus. Sent through covermymeds. Waiting on response

## 2022-01-13 NOTE — Telephone Encounter (Signed)
This is not covered under pt's plan. Please advise

## 2022-01-14 NOTE — Telephone Encounter (Signed)
Faxed over order to Patient care center for Iron infusions

## 2022-01-18 ENCOUNTER — Encounter: Payer: Self-pay | Admitting: Oncology

## 2022-01-25 ENCOUNTER — Non-Acute Institutional Stay (HOSPITAL_COMMUNITY)
Admission: RE | Admit: 2022-01-25 | Discharge: 2022-01-25 | Disposition: A | Discharge status: Home / Self Care | Payer: Managed Care, Other (non HMO) | Source: Ambulatory Visit | Attending: Internal Medicine | Admitting: Internal Medicine

## 2022-01-25 DIAGNOSIS — D649 Anemia, unspecified: Secondary | ICD-10-CM | POA: Diagnosis not present

## 2022-01-25 MED ORDER — SODIUM CHLORIDE 0.9 % IV SOLN: INTRAVENOUS | Status: DC | PRN

## 2022-01-25 MED ORDER — SODIUM CHLORIDE 0.9 % IV SOLN
510.0000 mg | Freq: Once | INTRAVENOUS | Status: AC
  Administered 2022-01-25: 510 mg via INTRAVENOUS
  Filled 2022-01-25: qty 510

## 2022-01-25 NOTE — Progress Notes (Signed)
PATIENT CARE CENTER NOTE  Provider: Jacklynn Barnacle  Procedure: Feraheme 510 mg  Note: Patient received Feraheme infusion. No pre- medications per order. Pt tolerated infusion well with no adverse reaction. Pt observed for 30 minutes post infusion. Vital signs stable. Discharge instructions given.Patient advised to schedule next appointment for one month. Alert, oriented and ambulatory at discharge.

## 2022-02-01 ENCOUNTER — Encounter: Payer: Self-pay | Admitting: Oncology

## 2022-02-17 ENCOUNTER — Encounter: Payer: Self-pay | Admitting: Oncology

## 2022-02-24 ENCOUNTER — Non-Acute Institutional Stay (HOSPITAL_COMMUNITY)
Admission: RE | Admit: 2022-02-24 | Discharge: 2022-02-24 | Disposition: A | Discharge status: Home / Self Care | Payer: Managed Care, Other (non HMO) | Source: Ambulatory Visit | Attending: Internal Medicine | Admitting: Internal Medicine

## 2022-02-24 DIAGNOSIS — D649 Anemia, unspecified: Secondary | ICD-10-CM | POA: Insufficient documentation

## 2022-02-24 MED ORDER — SODIUM CHLORIDE 0.9 % IV SOLN: INTRAVENOUS | Status: DC | PRN

## 2022-02-24 MED ORDER — SODIUM CHLORIDE 0.9 % IV SOLN
510.0000 mg | Freq: Once | INTRAVENOUS | Status: AC
  Administered 2022-02-24: 510 mg via INTRAVENOUS
  Filled 2022-02-24: qty 510

## 2022-02-24 NOTE — Progress Notes (Signed)
PATIENT CARE CENTER NOTE    Provider: Jacklynn Barnacle  Diagnosis: D64.9   Procedure: Feraheme 510 mg   Note: Patient received Feraheme infusion (dose # 2 of 2) via PIV. No pre- medications per order. Pt tolerated infusion well with no adverse reaction. Observed patient for 30 minutes post infusion. Vital signs stable. AVS offered but patient refused. Patient alert, oriented and ambulatory at discharge.

## 2022-03-29 ENCOUNTER — Encounter: Payer: Self-pay | Admitting: Certified Nurse Midwife

## 2022-03-30 ENCOUNTER — Encounter: Payer: Self-pay | Admitting: Certified Nurse Midwife

## 2022-03-30 ENCOUNTER — Ambulatory Visit (INDEPENDENT_AMBULATORY_CARE_PROVIDER_SITE_OTHER): Payer: Managed Care, Other (non HMO) | Admitting: Certified Nurse Midwife

## 2022-03-30 ENCOUNTER — Other Ambulatory Visit: Payer: Self-pay

## 2022-03-30 VITALS — BP 131/88 | HR 67 | Ht 64.0 in | Wt 192.9 lb

## 2022-03-30 DIAGNOSIS — Z862 Personal history of diseases of the blood and blood-forming organs and certain disorders involving the immune mechanism: Secondary | ICD-10-CM

## 2022-03-30 DIAGNOSIS — Z01419 Encounter for gynecological examination (general) (routine) without abnormal findings: Secondary | ICD-10-CM | POA: Diagnosis not present

## 2022-03-30 DIAGNOSIS — F901 Attention-deficit hyperactivity disorder, predominantly hyperactive type: Secondary | ICD-10-CM | POA: Diagnosis not present

## 2022-03-30 DIAGNOSIS — F419 Anxiety disorder, unspecified: Secondary | ICD-10-CM

## 2022-03-30 DIAGNOSIS — Z8669 Personal history of other diseases of the nervous system and sense organs: Secondary | ICD-10-CM

## 2022-03-30 DIAGNOSIS — Z8639 Personal history of other endocrine, nutritional and metabolic disease: Secondary | ICD-10-CM

## 2022-03-30 MED ORDER — ATOMOXETINE HCL 18 MG PO CAPS: 18.0000 mg | ORAL_CAPSULE | Freq: Every day | ORAL | 11 refills | Status: DC

## 2022-03-30 MED ORDER — MAG-OXIDE 200 MG PO TABS: 400.0000 mg | ORAL_TABLET | Freq: Every day | ORAL | 3 refills | Status: DC

## 2022-03-30 MED ORDER — VITAMIN D 125 MCG (5000 UT) PO CAPS: 1.0000 | ORAL_CAPSULE | Freq: Every day | ORAL | 11 refills | Status: DC

## 2022-03-30 MED ORDER — METOPROLOL SUCCINATE ER 25 MG PO TB24: 25.0000 mg | ORAL_TABLET | Freq: Every day | ORAL | 0 refills | Status: DC

## 2022-03-30 MED ORDER — RIZATRIPTAN BENZOATE 10 MG PO TABS: 10.0000 mg | ORAL_TABLET | ORAL | 0 refills | Status: DC | PRN

## 2022-03-30 NOTE — Progress Notes (Signed)
ANNUAL EXAM Patient name: Peggy Kelly MRN [id number]  Date of birth: 09/30/1990 Chief Complaint:   Gynecologic Exam  History of Present Illness:   Peggy Kelly is a 32 y.o. G40P1001 African-American female being seen today for a routine annual exam.  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 Having migraines, was taking rizatriptan and would like to use PRN again as well as discuss preventative measures.  Patient's last menstrual period was 03/04/2022 (exact date).   Upstream - 03/30/22 1727       Pregnancy Intention Screening   Does the patient want to become pregnant in the next year? Yes    Does the patient's partner want to become pregnant in the next year? Yes    Would the patient like to discuss contraceptive options today? N/A      Contraception Wrap Up   Contraception Counseling Provided No    How was the end contraceptive method provided? N/A            Last pap 01/06/22. Results were: NILM w/ HRHPV negative. H/O abnormal pap: no Last mammogram: Never (age). Results were: N/A. Family h/o breast cancer: no Last colonoscopy: Never (age). Results were: N/A. Family h/o colorectal cancer: no     03/30/2022    2:30 PM 01/11/2022   12:12 PM 03/22/2018   10:59 AM 06/06/2016    3:20 PM 05/25/2016    2:09 PM  Depression screen PHQ 2/9  Decreased Interest 0 0 0 0 0  Down, Depressed, Hopeless 0 0 0 0 0  PHQ - 2 Score 0 0 0 0 0  Altered sleeping 0      Tired, decreased energy 1      Change in appetite 0      Feeling bad or failure about yourself  0      Trouble concentrating 1      Moving slowly or fidgety/restless 0      Suicidal thoughts 0      PHQ-9 Score 2            03/30/2022    2:31 PM  GAD 7 : Generalized Anxiety Score  Nervous, Anxious, on Edge 1  Control/stop worrying 0   Worry too much - different things 0  Trouble relaxing 0  Restless 0  Easily annoyed or irritable 0  Afraid - awful might happen 0  Total GAD 7 Score 1     Review of Systems:   Pertinent items are noted in HPI Denies any headaches, blurred vision, fatigue, shortness of breath, chest pain, abdominal pain, abnormal vaginal discharge/itching/odor/irritation, problems with periods, bowel movements, urination, or intercourse unless otherwise stated above. Pertinent History Reviewed:  Reviewed past medical,surgical, social and family history.  Reviewed problem list, medications and allergies. Physical Assessment:   Vitals:   03/30/22 1426  BP: 131/88  Pulse: 67  Weight: 192 lb 14.4 oz (87.5 kg)  Height: '5\' 4"'$  (1.626 m)   Body mass index is 33.11 kg/m.   Physical Examination:  General appearance - well appearing, and in no distress Mental status - alert, oriented to person, place, and time Psych:  She has a normal mood and affect Skin - warm and dry, normal color Chest - effort normal, no problems with respiration noted Heart - normal rate and regular rhythm, warm and well perfused Neck:  midline trachea, no  thyromegaly or nodules Abdomen - soft, nontender, non-distended Pelvic - exam deferred Extremities:  No swelling or varicosities noted  Chaperone present for exam  No results found for this or any previous visit (from the past 24 hour(s)).  Assessment & Plan:  1. Encounter for annual routine gynecological examination - Mammogram: @ 32yo, or sooner if problems - Colonoscopy: @ 32yo, or sooner if problems - Thyroid Panel With TSH  2. History of migraine headaches - Has taken rizatriptan in the past and it works well, stopped because she was planning on getting pregnant. Will not be doing IVF until May so will refill. Since she has test anxiety as well, suggested  metoprolol as preventative therapy, pt agreed. - aspirin-acetaminophen-caffeine (EXCEDRIN MIGRAINE) 250-250-65  MG tablet; Take by mouth every 6 (six) hours as needed for headache. - rizatriptan (MAXALT) 10 MG tablet; Take 1 tablet (10 mg total) by mouth as needed for migraine. May repeat in 2 hours if needed  Dispense: 10 tablet; Refill: 0 - Magnesium Oxide -Mg Supplement (MAG-OXIDE) 200 MG TABS; Take 2 tablets (400 mg total) by mouth at bedtime. If that amount causes loose stools in the am, switch to '200mg'$  daily at bedtime.  Dispense: 60 tablet; Refill: 3 - metoprolol succinate (TOPROL XL) 25 MG 24 hr tablet; Take 1 tablet (25 mg total) by mouth daily.  Dispense: 30 tablet; Refill: 0  3. Attention deficit hyperactivity disorder (ADHD), predominantly hyperactive type - Given symptom presentation, discussed switching from zoloft to Strattera for better coverage of both anxiety and ADHD symptoms. Pt also agreed to [facility] referral. - atomoxetine (STRATTERA) 18 MG capsule; Take 1 capsule (18 mg total) by mouth daily.  Dispense: 30 capsule; Refill: 11 - Ambulatory referral to [facility]  4. Anxiety - atomoxetine (STRATTERA) 18 MG capsule; Take 1 capsule (18 mg total) by mouth daily.  Dispense: 30 capsule; Refill: 11 - Ambulatory referral to [facility]  5. History of vitamin D deficiency - Vitamin D (25 hydroxy) - Cholecalciferol (VITAMIN D) 125 MCG (5000 UT) CAPS; Take 1 Capful by mouth daily after breakfast.  Dispense: 30 capsule; Refill: 11  6. History of anemia - CBC  Orders Placed This Encounter  Procedures   CBC   Vitamin D (25 hydroxy)   Thyroid Panel With TSH   Ambulatory referral to [facility]   Meds:  Meds ordered this encounter  Medications   rizatriptan (MAXALT) 10 MG tablet    Sig: Take 1 tablet (10 mg total) by mouth as needed for migraine. May repeat in 2 hours if needed    Dispense:  10 tablet    Refill:  0   Cholecalciferol (VITAMIN D) 125 MCG (5000 UT) CAPS    Sig: Take 1 Capful by mouth daily after breakfast.    Dispense:  30  capsule    Refill:  11   Magnesium Oxide -Mg Supplement (MAG-OXIDE) 200 MG TABS    Sig: Take 2 tablets (400 mg total) by mouth at bedtime. If that amount causes loose stools in the am, switch to '200mg'$  daily at bedtime.    Dispense:  60 tablet    Refill:  3   metoprolol succinate (TOPROL XL) 25 MG 24 hr tablet    Sig: Take 1 tablet (25 mg total) by mouth daily.    Dispense:  30 tablet    Refill:  0   atomoxetine (STRATTERA) 18 MG capsule    Sig: Take 1 capsule (18 mg total) by mouth  daily.    Dispense:  30 capsule    Refill:  11   Follow-up: Return if symptoms worsen or fail to improve.  Gabriel Carina, CNM 03/30/2022 5:40 PM

## 2022-03-31 LAB — CBC
Hematocrit: 36.4 % (ref 34.0–46.6)
Hemoglobin: 11.6 g/dL (ref 11.1–15.9)
MCH: 27 pg (ref 26.6–33.0)
MCHC: 31.9 g/dL (ref 31.5–35.7)
MCV: 85 fL (ref 79–97)
Platelets: 335 10*3/uL (ref 150–450)
RBC: 4.29 x10E6/uL (ref 3.77–5.28)
RDW: 16.7 % — ABNORMAL HIGH (ref 11.7–15.4)
WBC: 5.2 10*3/uL (ref 3.4–10.8)

## 2022-03-31 LAB — THYROID PANEL WITH TSH
Free Thyroxine Index: 1.9 (ref 1.2–4.9)
T3 Uptake Ratio: 24 % (ref 24–39)
T4, Total: 8 ug/dL (ref 4.5–12.0)
TSH: 0.924 u[IU]/mL (ref 0.450–4.500)

## 2022-03-31 LAB — VITAMIN D 25 HYDROXY (VIT D DEFICIENCY, FRACTURES): Vit D, 25-Hydroxy: 41.6 ng/mL (ref 30.0–100.0)

## 2022-04-01 ENCOUNTER — Encounter: Payer: Self-pay | Admitting: Oncology

## 2022-04-05 NOTE — BH Specialist Note (Signed)
Integrated Behavioral Health via Telemedicine Visit  04/19/2022 Peggy Kelly [id number]  Number of [place] Clinician visits: 1- Initial Visit  Session Start time: 0924   Session End time: 0948  Total time in minutes: 24   Referring Provider: Gaylan Gerold, CNM Patient/Family location: Home [facility] Provider location: [facility] [place] [facility]  All persons participating in visit: Patient Peggy Kelly and [name]   Types of Service: Individual psychotherapy and Video visit  I connected with [name] and/or [name]'s  n/a  via  Telephone or [employer]  (Video is Caregility application) and verified that I am speaking with the correct person using two identifiers. Discussed confidentiality: Yes   I discussed the limitations of telemedicine and the availability of in person appointments.  Discussed there is a possibility of technology failure and discussed alternative modes of communication if that failure occurs.  I discussed that engaging in this telemedicine visit, they consent to the provision of behavioral healthcare and the services will be billed under their insurance.  Patient and/or legal guardian expressed understanding and consented to Telemedicine visit: Yes   Presenting Concerns: Patient and/or family reports the following symptoms/concerns: Anxious about upcoming final exam prior to graduation in May; Began taking Strattera with mild dizziness side effect at first; noticing improvement in focused concentration; open to implementing additoinal self-coping strategy.  Duration of problem: Ongoing; Severity of problem: moderate  Patient and/or Family's Strengths/Protective Factors: Social connections, Concrete supports in place (healthy food, safe environments, etc.), Sense of purpose, and Physical Health (exercise, healthy diet, medication compliance,  etc.)  Goals Addressed: Patient will:  Reduce symptoms of: anxiety and stress   Increase knowledge and/or ability of: self-management skills   Demonstrate ability to: Increase healthy adjustment to current life circumstances  Progress towards Goals: Ongoing  Interventions: Interventions utilized:  Mindfulness or Relaxation Training Standardized Assessments completed: Not Needed  Patient and/or Family Response: Patient agrees with treatment plan.   Assessment: Patient currently experiencing ADHD (as previously diagnosed).   Patient may benefit from brief therapeutic intervention today .  Plan: Follow up with behavioral health clinician on : Two weeks Behavioral recommendations:  -Continue taking BH medication as prescribed -Continue healthy self-care daily through graduation date (May 10) -CALM relaxation breathing exercise twice daily (morning; at bedtime with sleep sounds); as needed throughout the day, as practice prior to preparing for upcoming exams  Referral(s): [place] (In Clinic)  I discussed the assessment and treatment plan with the patient and/or parent/guardian. They were provided an opportunity to ask questions and all were answered. They agreed with the plan and demonstrated an understanding of the instructions.   They were advised to call back or seek an in-person evaluation if the symptoms worsen or if the condition fails to improve as anticipated.  [place], LCSW      03/30/2022    2:30 PM 01/11/2022   12:12 PM 03/22/2018   10:59 AM 06/06/2016    3:20 PM 05/25/2016    2:09 PM  Depression screen PHQ 2/9  Decreased Interest 0 0 0 0 0  Down, Depressed, Hopeless 0 0 0 0 0  PHQ - 2 Score 0 0 0 0 0  Altered sleeping 0      Tired, decreased energy 1      Change in appetite 0      Feeling bad or failure about yourself  0  Trouble concentrating 1      Moving slowly or fidgety/restless 0      Suicidal thoughts 0       PHQ-9 Score 2          03/30/2022    2:31 PM  GAD 7 : Generalized Anxiety Score  Nervous, Anxious, on Edge 1  Control/stop worrying 0  Worry too much - different things 0  Trouble relaxing 0  Restless 0  Easily annoyed or irritable 0  Afraid - awful might happen 0  Total GAD 7 Score 1

## 2022-04-13 NOTE — Addendum Note (Signed)
Addended by: Gaylan Gerold on: 04/13/2022 10:56 AM   Modules accepted: Orders

## 2022-04-19 ENCOUNTER — Ambulatory Visit (INDEPENDENT_AMBULATORY_CARE_PROVIDER_SITE_OTHER): Payer: BLUE CROSS/BLUE SHIELD | Admitting: Clinical

## 2022-04-19 DIAGNOSIS — F901 Attention-deficit hyperactivity disorder, predominantly hyperactive type: Secondary | ICD-10-CM | POA: Diagnosis not present

## 2022-04-19 NOTE — Patient Instructions (Signed)
Center for [facility] [place], [place] [address] [phone] (main office) [phone] (Kamaal Cast's office)  CALM:  Chest, Arms, Legs, Mouth (go through each area, notice tension, then relax the muscles in that area) Breathe in through your nose, count to 5, hold it Breathe out through your mouth, count to 5 Repeat three breath cycles

## 2022-04-25 NOTE — BH Specialist Note (Deleted)
Integrated Behavioral Health via Telemedicine Visit  04/25/2022 TAZIYAH KOPLIN [id number]  Number of [place] Clinician visits: 1- Initial Visit  Session Start time: 0924   Session End time: 0948  Total time in minutes: 24   Referring Provider: *** Patient/Family location: [facility] Provider location: *** All persons participating in visit: *** Types of Service: {CHL AMB TYPE OF SERVICE:[phone]}  I connected with [name] and/or Marlowe Aschoff R Ong's {family members:20773} via  Telephone or Geologist, engineering  (Video is Tree surgeon) and verified that I am speaking with the correct person using two identifiers. Discussed confidentiality: {YES/NO:21197}  I discussed the limitations of telemedicine and the availability of in person appointments.  Discussed there is a possibility of technology failure and discussed alternative modes of communication if that failure occurs.  I discussed that engaging in this telemedicine visit, they consent to the provision of behavioral healthcare and the services will be billed under their insurance.  Patient and/or legal guardian expressed understanding and consented to Telemedicine visit: {YES/NO:21197}  Presenting Concerns: Patient and/or family reports the following symptoms/concerns: *** Duration of problem: ***; Severity of problem: {Mild/Moderate/Severe:20260}  Patient and/or Family's Strengths/Protective Factors: {CHL AMB BH PROTECTIVE FACTORS:[phone]}  Goals Addressed: Patient will:  Reduce symptoms of: {IBH Symptoms:21014056}   Increase knowledge and/or ability of: {IBH Patient Tools:21014057}   Demonstrate ability to: {IBH Goals:21014053}  Progress towards Goals: {CHL AMB BH PROGRESS TOWARDS GOALS:[phone]}  Interventions: Interventions utilized:  {IBH Interventions:21014054} Standardized Assessments completed: {IBH Screening Tools:21014051}  Patient and/or  Family Response: ***  Assessment: Patient currently experiencing ***.   Patient may benefit from ***.  Plan: Follow up with behavioral health clinician on : *** Behavioral recommendations: *** Referral(s): {IBH Referrals:21014055}  I discussed the assessment and treatment plan with the patient and/or parent/guardian. They were provided an opportunity to ask questions and all were answered. They agreed with the plan and demonstrated an understanding of the instructions.   They were advised to call back or seek an in-person evaluation if the symptoms worsen or if the condition fails to improve as anticipated.  Caroleen Hamman Tashana Haberl, LCSW

## 2022-04-28 ENCOUNTER — Encounter: Payer: Self-pay | Admitting: Oncology

## 2022-05-02 ENCOUNTER — Other Ambulatory Visit: Payer: Self-pay | Admitting: *Deleted

## 2022-05-02 DIAGNOSIS — Z8669 Personal history of other diseases of the nervous system and sense organs: Secondary | ICD-10-CM

## 2022-05-02 NOTE — Telephone Encounter (Signed)
Received fax for refill of Metoprolol. Will route to provider. Angelina Pih

## 2022-05-03 MED ORDER — METOPROLOL SUCCINATE ER 25 MG PO TB24: 25.0000 mg | ORAL_TABLET | Freq: Every day | ORAL | 0 refills | Status: DC

## 2022-05-18 ENCOUNTER — Other Ambulatory Visit: Payer: Self-pay | Admitting: Certified Nurse Midwife

## 2022-05-18 DIAGNOSIS — Z8669 Personal history of other diseases of the nervous system and sense organs: Secondary | ICD-10-CM

## 2022-05-19 MED ORDER — RIZATRIPTAN BENZOATE 10 MG PO TABS: 10.0000 mg | ORAL_TABLET | ORAL | 0 refills | Status: DC | PRN

## 2022-06-02 ENCOUNTER — Other Ambulatory Visit: Payer: Self-pay | Admitting: Certified Nurse Midwife

## 2022-06-02 DIAGNOSIS — Z8669 Personal history of other diseases of the nervous system and sense organs: Secondary | ICD-10-CM

## 2022-06-03 MED ORDER — METOPROLOL SUCCINATE ER 25 MG PO TB24: 25.0000 mg | ORAL_TABLET | Freq: Every day | ORAL | 2 refills | Status: DC

## 2022-07-18 ENCOUNTER — Encounter: Payer: Self-pay | Admitting: Certified Nurse Midwife

## 2022-07-18 ENCOUNTER — Encounter: Payer: Self-pay | Admitting: Oncology

## 2022-07-18 ENCOUNTER — Other Ambulatory Visit: Payer: BLUE CROSS/BLUE SHIELD

## 2022-07-18 ENCOUNTER — Other Ambulatory Visit: Payer: Self-pay | Admitting: Certified Nurse Midwife

## 2022-07-18 ENCOUNTER — Other Ambulatory Visit: Payer: Self-pay

## 2022-07-18 DIAGNOSIS — Z3183 Encounter for assisted reproductive fertility procedure cycle: Secondary | ICD-10-CM

## 2022-07-18 DIAGNOSIS — Z3141 Encounter for fertility testing: Secondary | ICD-10-CM | POA: Diagnosis not present

## 2022-07-18 NOTE — Progress Notes (Signed)
Patient needs preconception testing prior to planned embryo transfer with [facility] in [place]. Results to be faxed to [phone] or [phone]. Lab appt scheduled for today, 07/18/22. Referral sent to [facility] to scheduled  sonohysterogram. Edd Arbour, CNM to follow up on results prior to faxing to [facility].

## 2022-07-18 NOTE — Progress Notes (Addendum)
[facility] referral coordinator Jon Gills declined referral; stated [facility] can only perform on established patients, unlike [facility] which can be done on outside patients. Referral sent to [facility], who will not have availability before 12th day of current cycle but can see patient for sonohysterogram with next cycle. Pt should contact their office on day 1 of cycle.

## 2022-07-19 LAB — ABO AND RH: Rh Factor: POSITIVE

## 2022-07-21 ENCOUNTER — Other Ambulatory Visit: Payer: Self-pay | Admitting: Certified Nurse Midwife

## 2022-07-21 DIAGNOSIS — Z8669 Personal history of other diseases of the nervous system and sense organs: Secondary | ICD-10-CM

## 2022-07-21 MED ORDER — RIZATRIPTAN BENZOATE 10 MG PO TABS: 10.0000 mg | ORAL_TABLET | ORAL | 0 refills | Status: DC | PRN

## 2022-07-24 LAB — COMPREHENSIVE METABOLIC PANEL
ALT: 17 IU/L (ref 0–32)
AST: 20 IU/L (ref 0–40)
Albumin: 4.1 g/dL (ref 3.9–4.9)
Alkaline Phosphatase: 61 IU/L (ref 44–121)
BUN/Creatinine Ratio: 10 (ref 9–23)
BUN: 7 mg/dL (ref 6–20)
Bilirubin Total: <0.2 mg/dL (ref 0.0–1.2)
CO2: 23 mmol/L (ref 20–29)
Calcium: 8.9 mg/dL (ref 8.7–10.2)
Chloride: 105 mmol/L (ref 96–106)
Creatinine, Ser: 0.71 mg/dL (ref 0.57–1.00)
Globulin, Total: 2.7 g/dL (ref 1.5–4.5)
Glucose: 82 mg/dL (ref 70–99)
Potassium: 4.1 mmol/L (ref 3.5–5.2)
Sodium: 141 mmol/L (ref 134–144)
Total Protein: 6.8 g/dL (ref 6.0–8.5)
eGFR: 115 mL/min/{1.73_m2} (ref >59)

## 2022-07-24 LAB — HEMOGLOBIN A1C
Est. average glucose Bld gHb Est-mCnc: 120 mg/dL
Hgb A1c MFr Bld: 5.8 % — ABNORMAL HIGH (ref 4.8–5.6)

## 2022-07-24 LAB — PROLACTIN: Prolactin: 9.3 ng/mL (ref 4.8–33.4)

## 2022-07-24 LAB — TESTOSTERONE: Testosterone: 9 ng/dL (ref 8–60)

## 2022-07-24 LAB — ANTI MULLERIAN HORMONE: ANTI-MULLERIAN HORMONE (AMH): 2.04 ng/mL

## 2022-07-24 LAB — DHEA-SULFATE: DHEA-SO4: 113 ug/dL (ref 84.8–378.0)

## 2022-07-24 LAB — HIV ANTIBODY (ROUTINE TESTING W REFLEX): HIV Screen 4th Generation wRfx: NONREACTIVE

## 2022-07-24 LAB — SYPHILIS: RPR W/REFLEX TO RPR TITER AND TREPONEMAL ANTIBODIES, TRADITIONAL SCREENING AND DIAGNOSIS ALGORITHM: RPR Ser Ql: NONREACTIVE

## 2022-07-24 LAB — RUBELLA SCREEN: Rubella Antibodies, IGG: 1.69 index (ref >0.99)

## 2022-07-24 LAB — HEPATITIS B SURFACE ANTIGEN: Hepatitis B Surface Ag: NEGATIVE

## 2022-07-24 LAB — VARICELLA ZOSTER ANTIBODY, IGG: Varicella zoster IgG: 1811 index (ref >165)

## 2022-07-25 ENCOUNTER — Encounter: Payer: Self-pay | Admitting: Oncology

## 2022-07-25 ENCOUNTER — Ambulatory Visit
Admission: RE | Admit: 2022-07-25 | Discharge: 2022-07-25 | Disposition: A | Discharge status: Home / Self Care | Payer: Managed Care, Other (non HMO) | Source: Ambulatory Visit | Attending: Certified Nurse Midwife | Admitting: Certified Nurse Midwife

## 2022-07-25 DIAGNOSIS — Z01818 Encounter for other preprocedural examination: Secondary | ICD-10-CM | POA: Diagnosis not present

## 2022-07-25 DIAGNOSIS — Z3183 Encounter for assisted reproductive fertility procedure cycle: Secondary | ICD-10-CM

## 2022-08-16 ENCOUNTER — Other Ambulatory Visit: Payer: Self-pay | Admitting: Certified Nurse Midwife

## 2022-08-16 ENCOUNTER — Encounter: Payer: Self-pay | Admitting: Oncology

## 2022-08-16 DIAGNOSIS — Z8669 Personal history of other diseases of the nervous system and sense organs: Secondary | ICD-10-CM

## 2022-08-16 MED ORDER — RIZATRIPTAN BENZOATE 10 MG PO TABS
10.0000 mg | ORAL_TABLET | ORAL | 0 refills | Status: DC | PRN
Start: 2022-08-16 — End: 2022-09-23

## 2022-08-16 NOTE — Progress Notes (Unsigned)
Requesting refill of Maxalt today. Last refill 07/21/22 with 10 tablets. Refill sent today. Pt reports continuing metoprolol and magnesium nightly for headache prevention with no improvement. Has had current headache for 2 days. Does seem to have relation to menstrual cycle. Offered referral to Nada Maclachlan PA at [facility] for further workup; pt agreeable and appt scheduled. Will continue to follow up with Edd Arbour CNM as needed.

## 2022-08-24 ENCOUNTER — Encounter: Payer: Self-pay | Admitting: Oncology

## 2022-08-26 ENCOUNTER — Encounter: Payer: Self-pay | Admitting: Physician Assistant

## 2022-08-26 ENCOUNTER — Ambulatory Visit (INDEPENDENT_AMBULATORY_CARE_PROVIDER_SITE_OTHER): Payer: Managed Care, Other (non HMO) | Admitting: Physician Assistant

## 2022-08-26 VITALS — BP 119/81 | HR 70 | Wt 197.8 lb

## 2022-08-26 DIAGNOSIS — G43009 Migraine without aura, not intractable, without status migrainosus: Secondary | ICD-10-CM

## 2022-08-26 MED ORDER — BUTALBITAL-APAP-CAFFEINE 50-325-40 MG PO CAPS: 1.0000 | ORAL_CAPSULE | Freq: Four times a day (QID) | ORAL | 1 refills | Status: DC | PRN

## 2022-08-26 MED ORDER — CYCLOBENZAPRINE HCL 10 MG PO TABS: 10.0000 mg | ORAL_TABLET | Freq: Three times a day (TID) | ORAL | 2 refills | Status: DC | PRN

## 2022-08-26 MED ORDER — SUMATRIPTAN SUCCINATE 100 MG PO TABS: 100.0000 mg | ORAL_TABLET | Freq: Once | ORAL | 11 refills | Status: DC | PRN

## 2022-08-26 NOTE — Progress Notes (Signed)
Pt stating that she has had head aches for years  [facility]       Headaches can get bad with cycle   Pt having frontal headaches  Does notice aura with they sometimes and photophobia   History:  Peggy Kelly is a 32 y.o. G1P1001 who presents to clinic today for new headache evaluation.  Pt has had migraine since onset of menses and they do relate to hormonal change.  Her mom and grandma also get headaches.  She was treated at [facility] center and has utilized trigger point injections/nerve blocks.  She used topiramate in the past.  Currently using metoprolol for migraine prevention and to help with anxiety.  She is also taking Strattera and magnesium.  She uses rizatriptan with success but notes she needs more in a month than what she is able to get.  She is expecting to have IVF transfer within the next month or 2.   Headaches get to be severe.  Can be one side or the other, usually frontal.  There is throbbing, sensitive to light and noise, movement.  She sees stars.  Often headache wakes her first in the am.  Sometimes teeth hurt.  Some nausea, no vomiting. Feels really sluggish and tired. They can last up to 4 days.  She has a headache of some kind 21 days of every month.     Past Medical History:  Diagnosis Date   Allergy    Anemia    Anxiety    Cesarean delivery delivered 08/20/2017   Migraines    Seasonal allergies    Vaginal Pap smear, abnormal     Social History   Socioeconomic History   Marital status: Married    Spouse name: Nurse, adult   Number of children: 0   Years of education: 16   Highest education level: Not on file  Occupational History    Comment: Advertising copywriter- data specialist  Tobacco Use   Smoking status: Never   Smokeless tobacco: Never  Vaping Use   Vaping status: Never Used  Substance and Sexual Activity   Alcohol use: Not Currently    Comment: occassional, 09/23/15 none now   Drug use: No    Sexual activity: Not Currently  Other Topics Concern   Not on file  Social History Narrative   ** Merged History Encounter **    Lives at home with husband, has 1 child, 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  Christian.  Exercise 3 days per week.    12/2021.   Social Determinants of Health   Financial Resource Strain: Not on file  Food Insecurity: Not on file  Transportation Needs: Not on file  Physical Activity: Not on file  Stress: Not on file  Social Connections: Not on file  Intimate Partner Violence: Not on file    Family History  Problem Relation Age of Onset   Thyroid disease Mother    Migraines Sister    Other Sister        anti NMDA receptor encephalitis   Thyroid disease Maternal Grandmother    Cancer Maternal Grandfather        prostate    Allergies  Allergen Reactions   Penicillins Hives    Patient doesn't remember what type of reaction. Has patient had a PCN reaction causing immediate rash, facial/tongue/throat swelling, SOB or lightheadedness with hypotension: No Has patient had a PCN reaction  causing severe rash involving mucus membranes or skin necrosis: No Has patient had a PCN reaction that required hospitalization: No Has patient had a PCN reaction occurring within the last 10 years: No If all of the above answers are "NO", then may proceed with Cephalosporin use.     Current Outpatient Medications on File Prior to Visit  Medication Sig Dispense Refill   aspirin-acetaminophen-caffeine (EXCEDRIN MIGRAINE) 250-250-65 MG tablet Take by mouth every 6 (six) hours as needed for headache.     atomoxetine (STRATTERA) 18 MG capsule Take 1 capsule (18 mg total) by mouth daily. 30 capsule 11   Cholecalciferol (VITAMIN D) 125 MCG (5000 UT) CAPS Take 1 Capful by mouth daily after breakfast. 30 capsule 11   fluocinolone (SYNALAR) 0.01 % external solution Apply topically daily. 60 mL 1   ketoconazole  (NIZORAL) 2 % shampoo Apply topically 2 (two) times a week. 120 mL 1   Magnesium Oxide -Mg Supplement (MAG-OXIDE) 200 MG TABS Take 2 tablets (400 mg total) by mouth at bedtime. If that amount causes loose stools in the am, switch to 200mg  daily at bedtime. 60 tablet 3   metoprolol succinate (TOPROL XL) 25 MG 24 hr tablet Take 1 tablet (25 mg total) by mouth daily. 90 tablet 2   mometasone (NASONEX) 50 MCG/ACT nasal spray Place 2 sprays into the nose daily.     rizatriptan (MAXALT) 10 MG tablet Take 1 tablet (10 mg total) by mouth as needed for migraine. May repeat in 2 hours if needed 10 tablet 0   triamcinolone cream (KENALOG) 0.1 % Apply 1 Application topically 2 (two) times daily. 45 g 1   No current facility-administered medications on file prior to visit.     Review of Systems:  All pertinent positive/negative included in HPI, all other review of systems are negative   Objective:  Physical Exam BP 119/81   Pulse 70   Wt 197 lb 12.8 oz (89.7 kg)   BMI 33.95 kg/m  CONSTITUTIONAL: Well-developed, well-nourished female in no acute distress.  EYES: EOM intact ENT: Normocephalic CARDIOVASCULAR: Regular rate and rhythm with no adventitious sounds.  RESPIRATORY: Normal rate.  MUSCULOSKELETAL: Normal ROM, muscle spasm noted SKIN: Warm, dry without erythema  NEUROLOGICAL: Alert, oriented, CN II-XII grossly intact, Appropriate balance PSYCH: Normal behavior, mood   Assessment & Plan:  Assessment: 1. Migraine without aura and without status migrainosus, not intractable      Plan: May have chronic migraine.  Will re-eval next visit.  Add NSAID to triptan during time when not pregnant Trial Sumatriptan in place of rizatriptan.  Use Flexeril prn headache AND prior to bed to help reduce baseline muscle spasm.  Sedation precautions discussed.  Pt advised will need to d/c meds with pregnancy.  She may continue to use flexeril (cat B), magnesium nightly for migraine prevention and  Fioricet for abortive migraine therapy on a limited basis. Pt advised fioricet can cause dependence and she should limit use to 2 days per week.  Ice/meditation/stretching/self care all recommended.  Hopefully headaches will improve during pregnancy as last time.   PT to RTC prn or when no longer pregnant/seeking pregnancy.  Follow-up PRN  44 minutes spent in face to face direct contact with patient provided this encounter.   Bertram Denver, PA-C 08/26/2022 10:51 AM

## 2022-08-26 NOTE — Patient Instructions (Signed)
Migraine Headache A migraine headache is a very strong throbbing pain on one or both sides of your head. This type of headache can also cause other symptoms. It can last from 4 hours to 3 days. Talk with your doctor about what things may bring on (trigger) this condition. What are the causes? The exact cause of a migraine is not known. This condition may be brought on or caused by: Smoking. Medicines, such as: Medicine used to treat chest pain (nitroglycerin). Birth control pills. Estrogen. Some blood pressure medicines. Certain substances in some foods or drinks. Foods and drinks, such as: Cheese. Chocolate. Alcohol. Caffeine. Doing physical activity that is very hard. Other things that may trigger a migraine headache include: Periods. Pregnancy. Hunger. Stress. Getting too much or too little sleep. Weather changes. Feeling tired (fatigue). What increases the risk? Being 25-55 years old. Being female. Having a family history of migraine headaches. Being Caucasian. Having a mental health condition, such as being sad (depressed) or feeling worried or nervous (anxious). Being very overweight (obese). What are the signs or symptoms? A throbbing pain. This pain may: Happen in any area of the head, such as on one or both sides. Make it hard to do daily activities. Get worse with physical activity. Get worse around bright lights, loud noises, or smells. Other symptoms may include: Feeling like you may vomit (nauseous). Vomiting. Dizziness. Before a migraine headache starts, you may get warning signs (an aura). An aura may include: Seeing flashing lights or having blind spots. Seeing bright spots, halos, or zigzag lines. Having tunnel vision or blurred vision. Having numbness or a tingling feeling. Having trouble talking. Having weak muscles. After a migraine ends, you may have symptoms. These may include: Tiredness. Trouble thinking (concentrating). How is this  treated? Taking medicines that: Relieve pain. Relieve the feeling like you may vomit. Prevent migraine headaches. Treatment may also include: Acupuncture. Lifestyle changes like avoiding foods that bring on migraine headaches. Learning ways to control your body functions (biofeedback). Therapy to help you know and deal with negative thoughts (cognitive behavioral therapy). Follow these instructions at home: Medicines Take over-the-counter and prescription medicines only as told by your doctor. If told, take steps to prevent problems with pooping (constipation). You may need to: Drink enough fluid to keep your pee (urine) pale yellow. Take medicines. You will be told what medicines to take. Eat foods that are high in fiber. These include beans, whole grains, and fresh fruits and vegetables. Limit foods that are high in fat and sugar. These include fried or sweet foods. Ask your doctor if you should avoid driving or using machines while you are taking your medicine. Lifestyle  Do not drink alcohol. Do not smoke or use any products that contain nicotine or tobacco. If you need help quitting, ask your doctor. Get 7-9 hours of sleep each night, or the amount recommended by your doctor. Find ways to deal with stress, such as meditation, deep breathing, or yoga. Try to exercise often. This can help lessen how bad and how often your migraines happen. General instructions Keep a journal to find out what may bring on your migraine headaches. This can help you avoid those things. For example, write down: What you eat and drink. How much sleep you get. Any change to your medicines or diet. If you have a migraine headache: Avoid things that make your symptoms worse, such as bright lights. Lie down in a dark, quiet room. Do not drive or use machinery. Ask your   doctor what activities are safe for you. Where to find more information Coalition for Headache and Migraine Patients (CHAMP):  headachemigraine.org American Migraine Foundation: americanmigrainefoundation.org National Headache Foundation: headaches.org Contact a doctor if: You get a migraine headache that is different or worse than others you have had. You have more than 15 days of headaches in one month. Get help right away if: Your migraine headache gets very bad. Your migraine headache lasts more than 72 hours. You have a fever or stiff neck. You have trouble seeing. Your muscles feel weak or like you cannot control them. You lose your balance a lot. You have trouble walking. You faint. You have a seizure. This information is not intended to replace advice given to you by your health care provider. Make sure you discuss any questions you have with your health care provider. Document Revised: 09/06/2021 Document Reviewed: 09/06/2021 Elsevier Patient Education  2024 Elsevier Inc.  

## 2022-09-23 ENCOUNTER — Other Ambulatory Visit: Payer: Self-pay | Admitting: Certified Nurse Midwife

## 2022-09-23 DIAGNOSIS — Z8669 Personal history of other diseases of the nervous system and sense organs: Secondary | ICD-10-CM

## 2022-09-23 MED ORDER — RIZATRIPTAN BENZOATE 10 MG PO TABS: 10.0000 mg | ORAL_TABLET | ORAL | 0 refills | Status: DC | PRN

## 2022-09-23 NOTE — Telephone Encounter (Signed)
Spoke with patient who states sumatriptan given by headache specialist was not as effective at rizatriptan. I explained that effectiveness of triptans can be patient specific and this would be helpful for headache specialist to know. She plans to send MyChart message to Nada Maclachlan PA for future prescription of rizatriptan as she would like her to manage these medications. Will refill today in meantime.

## 2022-09-30 ENCOUNTER — Encounter: Payer: Self-pay | Admitting: Oncology

## 2022-09-30 ENCOUNTER — Other Ambulatory Visit: Payer: Self-pay

## 2022-09-30 DIAGNOSIS — Z3183 Encounter for assisted reproductive fertility procedure cycle: Secondary | ICD-10-CM

## 2022-09-30 NOTE — Progress Notes (Unsigned)
Patient reports LMP 09/30/22; needs labs and [place] on either cycle day 4 or 5. [place] scheduled for 10/03/22 for baseline prior to embryo transfer. Will need STAT labs that morning and STAT read of [place] so that results can be sent to [facility]. Once received by fertility they will communicate with patient preparation for embryo transfer.

## 2022-10-03 ENCOUNTER — Ambulatory Visit
Admission: RE | Admit: 2022-10-03 | Discharge: 2022-10-03 | Disposition: A | Discharge status: Home / Self Care | Payer: Managed Care, Other (non HMO) | Source: Ambulatory Visit | Attending: Certified Nurse Midwife | Admitting: Certified Nurse Midwife

## 2022-10-03 ENCOUNTER — Other Ambulatory Visit: Payer: Self-pay

## 2022-10-03 ENCOUNTER — Other Ambulatory Visit: Payer: Managed Care, Other (non HMO)

## 2022-10-03 ENCOUNTER — Other Ambulatory Visit: Payer: Self-pay | Admitting: Certified Nurse Midwife

## 2022-10-03 DIAGNOSIS — Z0189 Encounter for other specified special examinations: Secondary | ICD-10-CM | POA: Diagnosis not present

## 2022-10-03 DIAGNOSIS — Z3183 Encounter for assisted reproductive fertility procedure cycle: Secondary | ICD-10-CM

## 2022-10-03 LAB — PROGESTERONE: Progesterone: 0.2 ng/mL

## 2022-10-03 LAB — FOLLICLE STIMULATING HORMONE: FSH: 8.1 m[IU]/mL

## 2022-10-03 LAB — ESTRADIOL: Estradiol: 24.2 pg/mL

## 2022-10-03 LAB — LUTEINIZING HORMONE: LH: 5.8 m[IU]/mL

## 2022-10-03 LAB — BETA HCG QUANT (REF LAB): hCG Quant: <1 m[IU]/mL

## 2022-10-03 LAB — TSH: TSH: 0.608 u[IU]/mL (ref 0.450–4.500)

## 2022-10-07 ENCOUNTER — Other Ambulatory Visit: Payer: Managed Care, Other (non HMO)

## 2022-10-07 ENCOUNTER — Telehealth: Payer: Self-pay

## 2022-10-07 DIAGNOSIS — Z3183 Encounter for assisted reproductive fertility procedure cycle: Secondary | ICD-10-CM

## 2022-10-11 ENCOUNTER — Other Ambulatory Visit: Payer: Managed Care, Other (non HMO)

## 2022-10-11 ENCOUNTER — Ambulatory Visit
Admission: RE | Admit: 2022-10-11 | Discharge: 2022-10-11 | Disposition: A | Discharge status: Home / Self Care | Payer: Managed Care, Other (non HMO) | Source: Ambulatory Visit | Attending: Certified Nurse Midwife | Admitting: Certified Nurse Midwife

## 2022-10-11 ENCOUNTER — Ambulatory Visit: Payer: BLUE CROSS/BLUE SHIELD | Admitting: Dermatology

## 2022-10-11 DIAGNOSIS — Z3183 Encounter for assisted reproductive fertility procedure cycle: Secondary | ICD-10-CM | POA: Insufficient documentation

## 2022-10-11 DIAGNOSIS — N838 Other noninflammatory disorders of ovary, fallopian tube and broad ligament: Secondary | ICD-10-CM | POA: Diagnosis not present

## 2022-10-11 LAB — LUTEINIZING HORMONE: LH: 9.9 m[IU]/mL

## 2022-10-11 LAB — ESTRADIOL: Estradiol: 175 pg/mL

## 2022-10-11 LAB — PROGESTERONE: Progesterone: 0.2 ng/mL

## 2022-10-11 NOTE — Telephone Encounter (Addendum)
Patient reports beginning medications prescribed by fertility including estrogen and Lovenox daily injections on 10/03/22 following previous US and lab results.  Patient needs ultrasound to assess follicle size, number, and endometrial lining per [facility] (see orders in media). Scheduled for 10/11/22 (cycle day 12). Will also need labs drawn that AM. Plans to come into office for labs prior to [place]. All results will be reviewed by Edd Arbour, CNM and forwarded to [facility].

## 2022-10-14 ENCOUNTER — Other Ambulatory Visit: Payer: Managed Care, Other (non HMO)

## 2022-10-14 DIAGNOSIS — Z3183 Encounter for assisted reproductive fertility procedure cycle: Secondary | ICD-10-CM | POA: Diagnosis not present

## 2022-10-14 DIAGNOSIS — D649 Anemia, unspecified: Secondary | ICD-10-CM

## 2022-10-14 LAB — LUTEINIZING HORMONE: LH: 17.5 m[IU]/mL

## 2022-10-14 LAB — ESTRADIOL: Estradiol: 152 pg/mL

## 2022-10-14 LAB — PROGESTERONE: Progesterone: 0.1 ng/mL

## 2022-10-24 ENCOUNTER — Other Ambulatory Visit: Payer: Managed Care, Other (non HMO)

## 2022-10-24 DIAGNOSIS — Z3183 Encounter for assisted reproductive fertility procedure cycle: Secondary | ICD-10-CM

## 2022-10-24 LAB — PROGESTERONE: Progesterone: 36.5 ng/mL

## 2022-10-24 LAB — ESTRADIOL: Estradiol: 205 pg/mL

## 2022-10-26 ENCOUNTER — Other Ambulatory Visit: Payer: Self-pay | Admitting: Certified Nurse Midwife

## 2022-10-26 DIAGNOSIS — R110 Nausea: Secondary | ICD-10-CM

## 2022-10-26 MED ORDER — BONJESTA 20-20 MG PO TBCR
1.0000 | EXTENDED_RELEASE_TABLET | Freq: Every day | ORAL | 3 refills | Status: DC
Start: 2022-10-26 — End: 2022-12-14

## 2022-10-31 ENCOUNTER — Other Ambulatory Visit: Payer: Self-pay

## 2022-10-31 ENCOUNTER — Other Ambulatory Visit: Payer: Managed Care, Other (non HMO)

## 2022-10-31 DIAGNOSIS — Z3200 Encounter for pregnancy test, result unknown: Secondary | ICD-10-CM

## 2022-10-31 LAB — PROGESTERONE: Progesterone: 34.6 ng/mL

## 2022-10-31 LAB — BETA HCG QUANT (REF LAB): hCG Quant: 77 m[IU]/mL

## 2022-11-02 ENCOUNTER — Ambulatory Visit: Payer: Managed Care, Other (non HMO) | Admitting: Certified Nurse Midwife

## 2022-11-02 ENCOUNTER — Encounter: Payer: Self-pay | Admitting: Certified Nurse Midwife

## 2022-11-02 VITALS — BP 118/82 | HR 83

## 2022-11-02 DIAGNOSIS — R110 Nausea: Secondary | ICD-10-CM

## 2022-11-02 DIAGNOSIS — L219 Seborrheic dermatitis, unspecified: Secondary | ICD-10-CM

## 2022-11-02 DIAGNOSIS — Z3490 Encounter for supervision of normal pregnancy, unspecified, unspecified trimester: Secondary | ICD-10-CM | POA: Diagnosis not present

## 2022-11-02 DIAGNOSIS — Z3183 Encounter for assisted reproductive fertility procedure cycle: Secondary | ICD-10-CM | POA: Diagnosis not present

## 2022-11-02 LAB — ESTRADIOL: Estradiol: 246 pg/mL

## 2022-11-02 LAB — TSH: TSH: 0.826 u[IU]/mL (ref 0.450–4.500)

## 2022-11-02 LAB — BETA HCG QUANT (REF LAB): hCG Quant: 101 m[IU]/mL

## 2022-11-02 LAB — PROGESTERONE: Progesterone: 34.2 ng/mL

## 2022-11-02 MED ORDER — KETOCONAZOLE 2 % EX SHAM: MEDICATED_SHAMPOO | CUTANEOUS | 1 refills | Status: AC

## 2022-11-02 MED ORDER — FLUOCINOLONE ACETONIDE 0.01 % EX SOLN
Freq: Every day | CUTANEOUS | 1 refills | Status: AC
Start: 2022-11-02 — End: ?

## 2022-11-02 NOTE — Progress Notes (Signed)
History:  Peggy Kelly is a 32 y.o. G2P1001 who presents to clinic today for follow-up bloodwork - she is undergoing IVF and needs trending lab values per the fertility specialist. Order scanned into chart.   The following portions of the patient's history were reviewed and updated as appropriate: allergies, current medications, family history, past medical history, social history, past surgical history and problem list.  Review of Systems:  Review of Systems  Gastrointestinal:  Positive for nausea. Negative for vomiting.  Neurological:  Positive for headaches (mild).      Objective:  Physical Exam BP 118/82   Pulse 83   LMP 09/30/2022 (Exact Date)  Physical Exam Vitals and nursing note reviewed.  Constitutional:      Appearance: Normal appearance.  Eyes:     Pupils: Pupils are equal, round, and reactive to light.  Cardiovascular:     Rate and Rhythm: Normal rate and regular rhythm.  Pulmonary:     Effort: Pulmonary effort is normal.  Abdominal:     Palpations: Abdomen is soft.     Tenderness: There is no abdominal tenderness.  Genitourinary:    General: Normal vulva.     Rectum: Normal.  Musculoskeletal:        General: Normal range of motion.  Skin:    General: Skin is warm and dry.     Capillary Refill: Capillary refill takes less than 2 seconds.  Neurological:     General: No focal deficit present.     Mental Status: She is alert and oriented to person, place, and time.  Psychiatric:        Mood and Affect: Mood normal.        Behavior: Behavior normal.    Labs and Imaging Results for orders placed or performed in visit on 11/02/22 (from the past 24 hour(s))  Progesterone     Status: None   Collection Time: 11/02/22  9:57 AM  Result Value Ref Range   Progesterone 34.2 ng/mL   Narrative   Performed at:  [address] [facility] [address] [place], [place]  [id number] Lab Director: Jolene Schimke MD, Phone:  [phone]  Estradiol     Status: None    Collection Time: 11/02/22  9:57 AM  Result Value Ref Range   Estradiol 246.0 pg/mL   Narrative   Performed at:  [address] - [facility] [address] [place], [place]  [id number] Lab Director: Jolene Schimke MD, Phone:  [phone]  TSH     Status: None   Collection Time: 11/02/22  9:57 AM  Result Value Ref Range   TSH 0.826 0.450 - 4.500 uIU/mL   Narrative   Performed at:  [address] [place], [place]  [id number] Lab Director: Jolene Schimke MD, Phone:  [phone]  Beta hCG quant (ref lab)     Status: None   Collection Time: 11/02/22  9:57 AM  Result Value Ref Range   hCG Quant 101 mIU/mL   Narrative   Performed at:  [address] [facility] [address] [place], [place]  [id number] Lab Director: Jolene Schimke MD, Phone:  [phone]   Assessment & Plan:  1. Patient undergoing in vitro fertilization - Labs ordered per scanned order. HCG resulted just over the 30% increase, will likely repeat in 48hrs. - Progesterone - Estradiol - TSH - Beta hCG quant (ref lab)  2. Nausea - Prescribed diclegis, will pick up today. Mostly having excessive saliva and nausea but no vomiting yet. Ptalysis currently  helped by chewing gum.  3. Seborrheic eczema of scalp - fluocinolone (SYNALAR) 0.01 % external solution; Apply topically daily.  Dispense: 60 mL; Refill: 1 - ketoconazole (NIZORAL) 2 % shampoo; Apply topically 2 (two) times a week.  Dispense: 120 mL; Refill: 1  Follow up for repeat labs or to establish routine OB care.  Bernerd Limbo, CNM 11/02/2022 5:18 PM

## 2022-11-04 ENCOUNTER — Other Ambulatory Visit: Payer: Managed Care, Other (non HMO)

## 2022-11-04 DIAGNOSIS — Z3183 Encounter for assisted reproductive fertility procedure cycle: Secondary | ICD-10-CM

## 2022-11-04 LAB — PROGESTERONE: Progesterone: 44.1 ng/mL

## 2022-11-04 LAB — ESTRADIOL: Estradiol: 229.6 pg/mL

## 2022-11-04 LAB — BETA HCG QUANT (REF LAB): hCG Quant: 169 m[IU]/mL

## 2022-11-07 ENCOUNTER — Other Ambulatory Visit: Payer: Managed Care, Other (non HMO)

## 2022-11-07 DIAGNOSIS — Z3183 Encounter for assisted reproductive fertility procedure cycle: Secondary | ICD-10-CM | POA: Diagnosis not present

## 2022-11-07 LAB — PROGESTERONE: Progesterone: 53.1 ng/mL

## 2022-11-07 LAB — ESTRADIOL: Estradiol: 243.7 pg/mL

## 2022-11-07 LAB — BETA HCG QUANT (REF LAB): hCG Quant: 374 m[IU]/mL

## 2022-11-08 ENCOUNTER — Other Ambulatory Visit: Payer: Self-pay

## 2022-11-08 DIAGNOSIS — O3680X0 Pregnancy with inconclusive fetal viability, not applicable or unspecified: Secondary | ICD-10-CM

## 2022-11-08 DIAGNOSIS — Z3183 Encounter for assisted reproductive fertility procedure cycle: Secondary | ICD-10-CM

## 2022-11-10 ENCOUNTER — Ambulatory Visit (HOSPITAL_COMMUNITY)
Admission: RE | Admit: 2022-11-10 | Discharge: 2022-11-10 | Disposition: A | Discharge status: Home / Self Care | Payer: Managed Care, Other (non HMO) | Source: Ambulatory Visit | Attending: Certified Nurse Midwife | Admitting: Certified Nurse Midwife

## 2022-11-10 ENCOUNTER — Other Ambulatory Visit: Payer: Managed Care, Other (non HMO)

## 2022-11-10 DIAGNOSIS — O3680X0 Pregnancy with inconclusive fetal viability, not applicable or unspecified: Secondary | ICD-10-CM | POA: Insufficient documentation

## 2022-11-10 DIAGNOSIS — Z3183 Encounter for assisted reproductive fertility procedure cycle: Secondary | ICD-10-CM | POA: Insufficient documentation

## 2022-11-10 DIAGNOSIS — O09811 Supervision of pregnancy resulting from assisted reproductive technology, first trimester: Secondary | ICD-10-CM | POA: Diagnosis not present

## 2022-11-10 DIAGNOSIS — Z3A01 Less than 8 weeks gestation of pregnancy: Secondary | ICD-10-CM | POA: Diagnosis not present

## 2022-11-10 LAB — ESTRADIOL: Estradiol: 237.3 pg/mL

## 2022-11-10 LAB — BETA HCG QUANT (REF LAB): hCG Quant: 878 m[IU]/mL

## 2022-11-10 LAB — PROGESTERONE: Progesterone: 35.8 ng/mL

## 2022-11-16 ENCOUNTER — Other Ambulatory Visit: Payer: Self-pay

## 2022-11-16 DIAGNOSIS — Z3183 Encounter for assisted reproductive fertility procedure cycle: Secondary | ICD-10-CM

## 2022-11-16 DIAGNOSIS — O3680X0 Pregnancy with inconclusive fetal viability, not applicable or unspecified: Secondary | ICD-10-CM

## 2022-11-16 NOTE — Progress Notes (Signed)
OB Transvaginal US scheduled per Edd Arbour CNM following orders from [facility]. Assessment of fetal viability needing following embryo transfer. Will also need HCG, Estradiol, and Progesterone labs same day. See scanned order labeled "[facility] Orders" on 11/10/22.

## 2022-11-17 ENCOUNTER — Ambulatory Visit (HOSPITAL_COMMUNITY)
Admission: RE | Admit: 2022-11-17 | Discharge: 2022-11-17 | Disposition: A | Discharge status: Home / Self Care | Payer: Managed Care, Other (non HMO) | Source: Ambulatory Visit | Attending: Certified Nurse Midwife | Admitting: Certified Nurse Midwife

## 2022-11-17 ENCOUNTER — Other Ambulatory Visit: Payer: Managed Care, Other (non HMO)

## 2022-11-17 DIAGNOSIS — Z3183 Encounter for assisted reproductive fertility procedure cycle: Secondary | ICD-10-CM

## 2022-11-17 DIAGNOSIS — Z3689 Encounter for other specified antenatal screening: Secondary | ICD-10-CM | POA: Diagnosis not present

## 2022-11-17 DIAGNOSIS — Z3A01 Less than 8 weeks gestation of pregnancy: Secondary | ICD-10-CM | POA: Diagnosis not present

## 2022-11-17 DIAGNOSIS — O3680X0 Pregnancy with inconclusive fetal viability, not applicable or unspecified: Secondary | ICD-10-CM | POA: Diagnosis present

## 2022-11-17 LAB — BETA HCG QUANT (REF LAB): hCG Quant: 4931 m[IU]/mL

## 2022-11-17 LAB — PROGESTERONE: Progesterone: 29.6 ng/mL

## 2022-11-17 LAB — ESTRADIOL: Estradiol: 216 pg/mL

## 2022-11-23 ENCOUNTER — Other Ambulatory Visit: Payer: Self-pay | Admitting: Certified Nurse Midwife

## 2022-11-23 ENCOUNTER — Other Ambulatory Visit: Payer: Managed Care, Other (non HMO)

## 2022-11-23 DIAGNOSIS — O09811 Supervision of pregnancy resulting from assisted reproductive technology, first trimester: Secondary | ICD-10-CM

## 2022-11-24 ENCOUNTER — Other Ambulatory Visit: Payer: Managed Care, Other (non HMO)

## 2022-11-24 ENCOUNTER — Ambulatory Visit (HOSPITAL_COMMUNITY)
Admission: RE | Admit: 2022-11-24 | Discharge: 2022-11-24 | Disposition: A | Discharge status: Home / Self Care | Payer: Managed Care, Other (non HMO) | Source: Ambulatory Visit | Attending: Certified Nurse Midwife | Admitting: Certified Nurse Midwife

## 2022-11-24 DIAGNOSIS — O09811 Supervision of pregnancy resulting from assisted reproductive technology, first trimester: Secondary | ICD-10-CM | POA: Insufficient documentation

## 2022-11-24 DIAGNOSIS — Z3A01 Less than 8 weeks gestation of pregnancy: Secondary | ICD-10-CM | POA: Diagnosis not present

## 2022-11-24 DIAGNOSIS — Z3682 Encounter for antenatal screening for nuchal translucency: Secondary | ICD-10-CM | POA: Diagnosis not present

## 2022-11-24 LAB — BETA HCG QUANT (REF LAB): hCG Quant: 17321 m[IU]/mL

## 2022-11-24 LAB — PROGESTERONE: Progesterone: 41.8 ng/mL

## 2022-11-24 LAB — ESTRADIOL: Estradiol: 346 pg/mL

## 2022-11-28 ENCOUNTER — Other Ambulatory Visit: Payer: Self-pay

## 2022-11-28 DIAGNOSIS — O219 Vomiting of pregnancy, unspecified: Secondary | ICD-10-CM

## 2022-11-28 MED ORDER — ONDANSETRON 4 MG PO TBDP
4.0000 mg | ORAL_TABLET | Freq: Three times a day (TID) | ORAL | 0 refills | Status: DC | PRN
Start: 2022-11-28 — End: 2022-12-14

## 2022-11-28 NOTE — Progress Notes (Unsigned)
Patient has been released from fertility care provider to begin routine OB care. Desires midwifery care with Edd Arbour, CNM. Unable to get Bonjesta due to very high co-pay. Has tried Unisom with B6 but continues to have nausea and vomiting in the AM. Will continue Unisom B6 combo and add Zofran 4 mg ODT. New OB scheduled for 12/21/22. May come after 9 weeks for [facility] check for reassurance.   Fleet Contras RN

## 2022-12-07 ENCOUNTER — Ambulatory Visit: Payer: Managed Care, Other (non HMO)

## 2022-12-07 DIAGNOSIS — Z3490 Encounter for supervision of normal pregnancy, unspecified, unspecified trimester: Secondary | ICD-10-CM

## 2022-12-07 NOTE — Progress Notes (Unsigned)
Here for FHR check for reassurance. Discussed possibility of not being able to auscultate FHR at [redacted]w[redacted]d and needing ultrasound. Unable to hear FHR with doppler. Pt moved to [place] room for informal [place] with [place], CNM.  Fleet Contras RN 12/07/22

## 2022-12-14 ENCOUNTER — Encounter (HOSPITAL_COMMUNITY): Payer: Self-pay | Admitting: Obstetrics and Gynecology

## 2022-12-14 ENCOUNTER — Other Ambulatory Visit: Payer: Self-pay

## 2022-12-14 ENCOUNTER — Ambulatory Visit: Payer: Managed Care, Other (non HMO) | Admitting: Certified Nurse Midwife

## 2022-12-14 DIAGNOSIS — F419 Anxiety disorder, unspecified: Secondary | ICD-10-CM

## 2022-12-14 DIAGNOSIS — Z3A08 8 weeks gestation of pregnancy: Secondary | ICD-10-CM | POA: Diagnosis not present

## 2022-12-14 DIAGNOSIS — O3680X0 Pregnancy with inconclusive fetal viability, not applicable or unspecified: Secondary | ICD-10-CM

## 2022-12-14 MED ORDER — TRAZODONE HCL 50 MG PO TABS
50.0000 mg | ORAL_TABLET | Freq: Two times a day (BID) | ORAL | 0 refills | Status: DC | PRN
Start: 2022-12-14 — End: 2022-12-22

## 2022-12-14 NOTE — Progress Notes (Signed)
Here to attempt Doppler of baby, could not hear with Doppler and bedside U/S out of service. Requested Dr. Vergie Living to do viability scan (asap for mental health of patient). He noted embryo measured about 8wks without heart tones. He gave pt news, they were appropriately upset given they've seen a FHR on ultrasound prior. Dr. Vergie Living recommended a formal u/s then [facility]. Pt declined formal ultrasound today but agreed to ultrasound in OR prior to procedure to verify his findings. Also requested something for anxiety as she is unlikely to sleep prior to the procedure. Trazadone sent in, pt aware of community mental health resources. FOB present for entire visit, very engaged and supportive. Will get D&C scheduled and send message re ultrasound prior to procedure.  Edd Arbour, CNM, MSN, IBCLC Certified Nurse Midwife, [facility]

## 2022-12-14 NOTE — Progress Notes (Signed)
OB Note I was asked to see Ms. Peggy Kelly by Gevena Cotton b/c of inability to get FHTs on doppler. I did a TAUS and I saw an IUP that looked small and no e/o cardiac motion. She consented to a TVUS; I was chaperoned by RN Day.  TVUS done and no cardiac motion visually and with doppler flow. Baby only looked about 8 weeks. Patient understandably emotional with partner. I told her I recommend getting a formal u/s but she declines currently.   Will have CNM Walker f/u with her. If confirmed, I recommend an outpatient D&C in an ASC.    Cornelia Copa MD Attending Center for [employer] (Faculty Practice) 12/14/2022 Time: 10am

## 2022-12-14 NOTE — Progress Notes (Signed)
SDW CALL  Patient was given pre-op instructions over the phone. The opportunity was given for the patient to ask questions. No further questions asked. Patient verbalized understanding of instructions given.   PCP - none Cardiologist - denies  PPM/ICD - denies Device Orders -  Rep Notified -   Chest x-ray - na EKG - na Stress Test - denies ECHO - denies Cardiac Cath - deneis  Sleep Study -  CPAP -   Fasting Blood Sugar - na Checks Blood Sugar _____ times a day  Blood Thinner Instructions:na Aspirin Instructions:na  ERAS Protcol -clears until 1130 PRE-SURGERY Ensure or G2- no  COVID TEST- na   Anesthesia review: no  Patient denies shortness of breath, fever, cough and chest pain over the phone call    Surgical Instructions    Your procedure is scheduled on November 21  Report to [facility] Main Entrance "A" at 1200P.M., then check in with the Admitting office.  Call this number if you have problems the morning of surgery:  [phone]    Remember:  Do not eat after midnight the night before your surgery  You may drink clear liquids until 1130 the morning of your surgery.   Clear liquids allowed are: Water, Non-Citrus Juices (without pulp), Carbonated Beverages, Clear Tea, Black Coffee ONLY (NO MILK, CREAM OR POWDERED CREAMER of any kind), and Gatorade   Take these medicines the morning of surgery with A SIP OF WATER:  PRN- Tylenol,Maxalt,Imitrex As of today, STOP taking any Aspirin (unless otherwise instructed by your surgeon) Aleve, Naproxen, Ibuprofen, Motrin, Advil, Goody's, BC's, all herbal medications, fish oil, and all vitamins.   is not responsible for any belongings or valuables. .   Do NOT Smoke (Tobacco/Vaping)  24 hours prior to your procedure  If you use a CPAP at night, you may bring your mask for your overnight stay.   Contacts, glasses, hearing aids, dentures or partials may not be worn into surgery, please bring cases for  these belongings   Patients discharged the day of surgery will not be allowed to drive home, and someone needs to stay with them for 24 hours.   Special instructions:    Oral Hygiene is also important to reduce your risk of infection.  Remember - BRUSH YOUR TEETH THE MORNING OF SURGERY WITH YOUR REGULAR TOOTHPASTE   Day of Surgery:  Take a shower the day of or night before with antibacterial soap. Wear Clean/Comfortable clothing the morning of surgery Do not apply any deodorants/lotions.   Do not wear jewelry or makeup Do not wear lotions, powders, perfumes/colognes, or deodorant. Do not shave 48 hours prior to surgery.  Men may shave face and neck. Do not bring valuables to the hospital. Do not wear nail polish, gel polish, artificial nails, or any other type of covering on natural nails (fingers and toes) If you have artificial nails or gel coating that need to be removed by a nail salon, please have this removed prior to surgery. Artificial nails or gel coating may interfere with anesthesia's ability to adequately monitor your vital signs. Remember to brush your teeth WITH YOUR REGULAR TOOTHPASTE.

## 2022-12-15 ENCOUNTER — Ambulatory Visit (HOSPITAL_BASED_OUTPATIENT_CLINIC_OR_DEPARTMENT_OTHER): Payer: Managed Care, Other (non HMO) | Admitting: Anesthesiology

## 2022-12-15 ENCOUNTER — Ambulatory Visit (HOSPITAL_COMMUNITY): Payer: Managed Care, Other (non HMO) | Admitting: Anesthesiology

## 2022-12-15 ENCOUNTER — Ambulatory Visit (HOSPITAL_COMMUNITY)
Admission: RE | Admit: 2022-12-15 | Discharge: 2022-12-15 | Disposition: A | Discharge status: Home / Self Care | Payer: Managed Care, Other (non HMO) | Attending: Obstetrics and Gynecology | Admitting: Obstetrics and Gynecology

## 2022-12-15 ENCOUNTER — Encounter (HOSPITAL_COMMUNITY): Payer: Self-pay | Admitting: Obstetrics and Gynecology

## 2022-12-15 ENCOUNTER — Other Ambulatory Visit: Payer: Self-pay

## 2022-12-15 ENCOUNTER — Encounter (HOSPITAL_COMMUNITY)
Admission: RE | Disposition: A | Discharge status: Home / Self Care | Payer: Self-pay | Source: Home / Self Care | Attending: Obstetrics and Gynecology

## 2022-12-15 DIAGNOSIS — D649 Anemia, unspecified: Secondary | ICD-10-CM | POA: Diagnosis not present

## 2022-12-15 DIAGNOSIS — Z3A08 8 weeks gestation of pregnancy: Secondary | ICD-10-CM

## 2022-12-15 DIAGNOSIS — Z3A00 Weeks of gestation of pregnancy not specified: Secondary | ICD-10-CM | POA: Diagnosis not present

## 2022-12-15 DIAGNOSIS — O021 Missed abortion: Secondary | ICD-10-CM

## 2022-12-15 DIAGNOSIS — N882 Stricture and stenosis of cervix uteri: Secondary | ICD-10-CM | POA: Diagnosis not present

## 2022-12-15 DIAGNOSIS — G43909 Migraine, unspecified, not intractable, without status migrainosus: Secondary | ICD-10-CM | POA: Diagnosis not present

## 2022-12-15 HISTORY — DX: Missed abortion: O02.1

## 2022-12-15 HISTORY — PX: DILATION AND EVACUATION: SHX1459

## 2022-12-15 LAB — CBC
HCT: 35.8 % — ABNORMAL LOW (ref 36.0–46.0)
Hemoglobin: 11.4 g/dL — ABNORMAL LOW (ref 12.0–15.0)
MCH: 26.8 pg (ref 26.0–34.0)
MCHC: 31.8 g/dL (ref 30.0–36.0)
MCV: 84 fL (ref 80.0–100.0)
Platelets: 363 K/uL (ref 150–400)
RBC: 4.26 MIL/uL (ref 3.87–5.11)
RDW: 13.7 % (ref 11.5–15.5)
WBC: 5.5 K/uL (ref 4.0–10.5)
nRBC: 0 % (ref 0.0–0.2)

## 2022-12-15 LAB — TYPE AND SCREEN
ABO/RH(D): A POS
Antibody Screen: NEGATIVE

## 2022-12-15 SURGERY — DILATION AND EVACUATION, UTERUS
Anesthesia: General | Site: Vagina

## 2022-12-15 MED ORDER — ORAL CARE MOUTH RINSE: 15.0000 mL | Freq: Once | OROMUCOSAL | Status: AC

## 2022-12-15 MED ORDER — FENTANYL CITRATE (PF) 100 MCG/2ML IJ SOLN
INTRAMUSCULAR | Status: DC | PRN
  Administered 2022-12-15: 50 ug via INTRAVENOUS
  Administered 2022-12-15: 100 ug via INTRAVENOUS
  Administered 2022-12-15: 50 ug via INTRAVENOUS
  Administered 2022-12-15 (×2): 25 ug via INTRAVENOUS

## 2022-12-15 MED ORDER — DEXAMETHASONE SODIUM PHOSPHATE 4 MG/ML IJ SOLN
INTRAMUSCULAR | Status: DC | PRN
  Administered 2022-12-15: 10 mg via INTRAVENOUS

## 2022-12-15 MED ORDER — OXYCODONE HCL 5 MG PO TABS: 5.0000 mg | ORAL_TABLET | Freq: Once | ORAL | Status: AC | PRN

## 2022-12-15 MED ORDER — LIDOCAINE 2% (20 MG/ML) 5 ML SYRINGE
INTRAMUSCULAR | Status: DC | PRN
  Administered 2022-12-15: 60 mg via INTRAVENOUS

## 2022-12-15 MED ORDER — FENTANYL CITRATE (PF) 100 MCG/2ML IJ SOLN
INTRAMUSCULAR | Status: AC
  Filled 2022-12-15: qty 2

## 2022-12-15 MED ORDER — KETOROLAC TROMETHAMINE 30 MG/ML IJ SOLN
INTRAMUSCULAR | Status: DC | PRN
  Administered 2022-12-15: 30 mg via INTRAVENOUS

## 2022-12-15 MED ORDER — HYDROMORPHONE HCL 1 MG/ML IJ SOLN
INTRAMUSCULAR | Status: AC
  Filled 2022-12-15: qty 1

## 2022-12-15 MED ORDER — KETOROLAC TROMETHAMINE 30 MG/ML IJ SOLN
30.0000 mg | Freq: Once | INTRAMUSCULAR | Status: DC | PRN
Start: 2022-12-15 — End: 2022-12-15

## 2022-12-15 MED ORDER — MEPERIDINE HCL 25 MG/ML IJ SOLN: 6.2500 mg | INTRAMUSCULAR | Status: DC | PRN

## 2022-12-15 MED ORDER — ACETAMINOPHEN 325 MG PO TABS
325.0000 mg | ORAL_TABLET | ORAL | Status: DC | PRN
Start: 2022-12-15 — End: 2022-12-15

## 2022-12-15 MED ORDER — DEXMEDETOMIDINE HCL IN NACL 80 MCG/20ML IV SOLN
INTRAVENOUS | Status: DC | PRN
  Administered 2022-12-15: 20 ug via INTRAVENOUS

## 2022-12-15 MED ORDER — MIDAZOLAM HCL 5 MG/5ML IJ SOLN
INTRAMUSCULAR | Status: DC | PRN
  Administered 2022-12-15: 2 mg via INTRAVENOUS

## 2022-12-15 MED ORDER — DOXYCYCLINE HYCLATE 100 MG IV SOLR
200.0000 mg | INTRAVENOUS | Status: AC
  Administered 2022-12-15: 200 mg via INTRAVENOUS
  Filled 2022-12-15: qty 200

## 2022-12-15 MED ORDER — POVIDONE-IODINE 10 % EX SWAB
2.0000 | Freq: Once | CUTANEOUS | Status: AC
  Administered 2022-12-15: 2 via TOPICAL

## 2022-12-15 MED ORDER — ACETAMINOPHEN 160 MG/5ML PO SOLN: 325.0000 mg | ORAL | Status: DC | PRN

## 2022-12-15 MED ORDER — MIDAZOLAM HCL 2 MG/2ML IJ SOLN
INTRAMUSCULAR | Status: AC
  Filled 2022-12-15: qty 2

## 2022-12-15 MED ORDER — ONDANSETRON HCL 4 MG/2ML IJ SOLN
4.0000 mg | Freq: Once | INTRAMUSCULAR | Status: DC | PRN
Start: 2022-12-15 — End: 2022-12-15

## 2022-12-15 MED ORDER — ACETAMINOPHEN 500 MG PO TABS
1000.0000 mg | ORAL_TABLET | ORAL | Status: AC
  Administered 2022-12-15: 1000 mg via ORAL
  Filled 2022-12-15: qty 2

## 2022-12-15 MED ORDER — LACTATED RINGERS IV SOLN: INTRAVENOUS | Status: DC

## 2022-12-15 MED ORDER — SOD CITRATE-CITRIC ACID 500-334 MG/5ML PO SOLN
30.0000 mL | ORAL | Status: AC
  Administered 2022-12-15: 30 mL via ORAL
  Filled 2022-12-15: qty 30

## 2022-12-15 MED ORDER — CHLORHEXIDINE GLUCONATE 0.12 % MT SOLN
15.0000 mL | Freq: Once | OROMUCOSAL | Status: AC
  Administered 2022-12-15: 15 mL via OROMUCOSAL
  Filled 2022-12-15: qty 15

## 2022-12-15 MED ORDER — IBUPROFEN 600 MG PO TABS: 600.0000 mg | ORAL_TABLET | Freq: Four times a day (QID) | ORAL | 2 refills | Status: DC | PRN

## 2022-12-15 MED ORDER — OXYCODONE HCL 5 MG/5ML PO SOLN
ORAL | Status: AC
  Filled 2022-12-15: qty 5

## 2022-12-15 MED ORDER — FENTANYL CITRATE (PF) 100 MCG/2ML IJ SOLN
25.0000 ug | INTRAMUSCULAR | Status: DC | PRN
  Administered 2022-12-15 (×3): 50 ug via INTRAVENOUS

## 2022-12-15 MED ORDER — OXYCODONE HCL 5 MG/5ML PO SOLN
5.0000 mg | Freq: Once | ORAL | Status: AC | PRN
  Administered 2022-12-15: 5 mg via ORAL

## 2022-12-15 MED ORDER — ONDANSETRON HCL 4 MG/2ML IJ SOLN
INTRAMUSCULAR | Status: DC | PRN
  Administered 2022-12-15: 4 mg via INTRAVENOUS

## 2022-12-15 MED ORDER — 0.9 % SODIUM CHLORIDE (POUR BTL) OPTIME
TOPICAL | Status: DC | PRN
  Administered 2022-12-15: 1000 mL

## 2022-12-15 MED ORDER — HYDROMORPHONE HCL 1 MG/ML IJ SOLN
0.5000 mg | INTRAMUSCULAR | Status: AC | PRN
  Administered 2022-12-15 (×2): 0.5 mg via INTRAVENOUS

## 2022-12-15 MED ORDER — PROPOFOL 10 MG/ML IV BOLUS
INTRAVENOUS | Status: DC | PRN
  Administered 2022-12-15: 200 mg via INTRAVENOUS

## 2022-12-15 MED ORDER — FENTANYL CITRATE (PF) 250 MCG/5ML IJ SOLN
INTRAMUSCULAR | Status: AC
  Filled 2022-12-15: qty 5

## 2022-12-15 SURGICAL SUPPLY — 15 items
CATH ROBINSON RED A/P 16FR (CATHETERS) IMPLANT
FILTER UTR ASPR ASSEMBLY (MISCELLANEOUS) ×1 IMPLANT
GLOVE SURG ORTHO 8.0 STRL STRW (GLOVE) ×1 IMPLANT
GOWN STRL REUS W/ TWL LRG LVL3 (GOWN DISPOSABLE) ×1 IMPLANT
GOWN STRL REUS W/ TWL XL LVL3 (GOWN DISPOSABLE) ×1 IMPLANT
HOSE CONNECTING 18IN BERKELEY (TUBING) ×1 IMPLANT
KIT BERKELEY 1ST TRI 3/8 NO TR (MISCELLANEOUS) ×1 IMPLANT
KIT BERKELEY 1ST TRIMESTER 3/8 (MISCELLANEOUS) ×1 IMPLANT
NS IRRIG 1000ML POUR BTL (IV SOLUTION) ×1 IMPLANT
PACK VAGINAL MINOR WOMEN LF (CUSTOM PROCEDURE TRAY) ×1 IMPLANT
PAD OB MATERNITY 4.3X12.25 (PERSONAL CARE ITEMS) ×1 IMPLANT
SET BERKELEY SUCTION TUBING (SUCTIONS) ×1 IMPLANT
TOWEL GREEN STERILE FF (TOWEL DISPOSABLE) ×1 IMPLANT
UNDERPAD 30X36 HEAVY ABSORB (UNDERPADS AND DIAPERS) ×1 IMPLANT
VACURETTE 8 RIGID CVD (CANNULA) IMPLANT

## 2022-12-15 NOTE — Anesthesia Procedure Notes (Signed)
Procedure Name: LMA Insertion Date/Time: 12/15/2022 12:40 PM  Performed by: Caren Macadam, CRNAPre-anesthesia Checklist: Patient identified, Emergency Drugs available, Suction available and Patient being monitored Patient Re-evaluated:Patient Re-evaluated prior to induction Oxygen Delivery Method: Circle system utilized Preoxygenation: Pre-oxygenation with 100% oxygen Induction Type: IV induction Ventilation: Mask ventilation without difficulty LMA: LMA inserted LMA Size: 4.0 Number of attempts: 1 Airway Equipment and Method: Bite block Placement Confirmation: positive ETCO2 Tube secured with: Tape Dental Injury: Teeth and Oropharynx as per pre-operative assessment  Comments: Lauren Cozart, SRNA placed LMA under direct supervision. CRNA and MDA at bedside.

## 2022-12-15 NOTE — Transfer of Care (Signed)
Immediate Anesthesia Transfer of Care Note  Patient: Peggy Kelly  Procedure(s) Performed: DILATATION AND EVACUATION (Vagina )  Patient Location: PACU  Anesthesia Type:General  Level of Consciousness: drowsy  Airway & Oxygen Therapy: Patient Spontanous Breathing and Patient connected to nasal cannula oxygen  Post-op Assessment: Report given to RN and Post -op Vital signs reviewed and stable  Post vital signs: Reviewed and stable  Last Vitals:  Vitals Value Taken Time  BP    Temp    Pulse    Resp    SpO2      Last Pain:  Vitals:   12/15/22 1015  TempSrc: Oral         Complications: No notable events documented.

## 2022-12-15 NOTE — H&P (Signed)
OB/GYN Pre-Op History and Physical  Peggy Kelly is a 32 y.o. G2P1001 presenting for suction dilation and evacuation due to missed miscarriage.  Pt was seen by Dr. Vergie Living and Kayleen Memos on 12/14/22.  Bedside ultrasound was performed by Dr. Vergie Living which showed nonviable 8 week IUP.  Pt was counseled regarding D and C.  Risks and benefits given including bleeding, infection, involvement of other organs as well as uterine perforation.  Of note this was an IVF pregnancy.  Offered to try and get repeat bedside ultrasound, but patient declined.        Past Medical History:  Diagnosis Date   Allergy    Anemia    Anxiety    Cesarean delivery delivered 08/20/2017   Migraines    Seasonal allergies    Vaginal Pap smear, abnormal     Past Surgical History:  Procedure Laterality Date   APPENDECTOMY     CESAREAN SECTION N/A 08/20/2017   Procedure: CESAREAN SECTION;  Surgeon: Candice Camp, MD;  Location: [facility]IR[facility];  Service: Obstetrics;  Laterality: N/A;   COLPOSCOPY N/A 05/09/2013   Procedure: COLPOSCOPY WITH ECC;  Surgeon: Zelphia Cairo, MD;  Location: [facility] ORS;  Service: Gynecology;  Laterality: N/A;   DILATATION & CURETTAGE/HYSTEROSCOPY WITH TRUECLEAR N/A 05/09/2013   Procedure: DILATATION & CURETTAGE/HYSTEROSCOPY WITH TRUCLEAR;  Surgeon: Zelphia Cairo, MD;  Location: [facility] ORS;  Service: Gynecology;  Laterality: N/A;   LEEP      OB History  Gravida Para Term Preterm AB Living  2 1 1  0 0 1  SAB IAB Ectopic Multiple Live Births  0 0 0   1    # Outcome Date GA Lbr Len/2nd Weight Sex Type Anes PTL Lv  2 Current           1 Term 08/20/17 [redacted]w[redacted]d  3345 g M CS-LTranv Spinal  LIV    Social History   Socioeconomic History   Marital status: Married    Spouse name: Nurse, adult   Number of children: 0   Years of education: 16   Highest education level: Not on file  Occupational History    Comment: Advertising copywriter- data specialist  Tobacco Use   Smoking status: Never    Smokeless tobacco: Never  Vaping Use   Vaping status: Never Used  Substance and Sexual Activity   Alcohol use: Not Currently    Comment: occassional, 09/23/15 none now   Drug use: No   Sexual activity: Not Currently  Other Topics Concern   Not on file  Social History Narrative   ** Merged History Encounter **    Lives at home with husband, has 1 child, 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  Christian.  Exercise 3 days per week.    12/2021.   Social Determinants of Health   Financial Resource Strain: Not on file  Food Insecurity: Not on file  Transportation Needs: Not on file  Physical Activity: Not on file  Stress: Not on file  Social Connections: Not on file    Family History  Problem Relation Age of Onset   Thyroid disease Mother    Migraines Sister    Other Sister        anti NMDA receptor encephalitis   Thyroid disease Maternal Grandmother    Cancer Maternal Grandfather        prostate    Medications Prior to Admission  Medication Sig Dispense Refill Last Dose  acetaminophen (TYLENOL) 500 MG tablet Take 500-1,000 mg by mouth every 6 (six) hours as needed (pain/migraine).   12/14/2022   Butalbital-APAP-Caffeine 50-325-40 MG capsule Take 1-2 capsules by mouth every 6 (six) hours as needed for headache. 30 capsule 1    ketoconazole (NIZORAL) 2 % shampoo Apply topically 2 (two) times a week. (Patient taking differently: Apply 1 Application topically daily as needed (scalp irritation.).) 120 mL 1 Past Month   Magnesium Oxide -Mg Supplement (MAG-OXIDE) 200 MG TABS Take 2 tablets (400 mg total) by mouth at bedtime. If that amount causes loose stools in the am, switch to 200mg  daily at bedtime. (Patient taking differently: Take 200 mg by mouth at bedtime.) 60 tablet 3 Past Week   traZODone (DESYREL) 50 MG tablet Take 1 tablet (50 mg total) by mouth 2 (two) times daily as needed for sleep. 15 tablet 0 12/14/2022    VITAMIN D PO Take 1 tablet by mouth at bedtime.   Past Week   fluocinolone (SYNALAR) 0.01 % external solution Apply topically daily. (Patient taking differently: Apply 1 Application topically daily as needed (scalp irritation.).) 60 mL 1 More than a month   rizatriptan (MAXALT) 10 MG tablet Take 1 tablet (10 mg total) by mouth as needed for migraine. May repeat in 2 hours if needed 10 tablet 0 More than a month   SUMAtriptan (IMITREX) 100 MG tablet Take 1 tablet (100 mg total) by mouth once as needed for up to 1 dose for migraine. May repeat in 2 hours if headache persists or recurs. 9 tablet 11 More than a month    Allergies  Allergen Reactions   Penicillins Hives    Patient doesn't remember what type of reaction. Has patient had a PCN reaction causing immediate rash, facial/tongue/throat swelling, SOB or lightheadedness with hypotension: No Has patient had a PCN reaction causing severe rash involving mucus membranes or skin necrosis: No Has patient had a PCN reaction that required hospitalization: No Has patient had a PCN reaction occurring within the last 10 years: No If all of the above answers are "NO", then may proceed with Cephalosporin use.     Review of Systems: Negative except for what is mentioned in HPI.     Physical Exam: BP 134/86   Pulse 90   Temp 97.9 F (36.6 C) (Oral)   Resp 18   Ht 5\' 4"  (1.626 m)   Wt 83.9 kg   LMP 09/30/2022 (Exact Date)   SpO2 99%   BMI 31.76 kg/m  CONSTITUTIONAL: Well-developed, well-nourished female in no acute distress.  HENT:  Normocephalic, atraumatic, External right and left ear normal. Oropharynx is clear and moist EYES: Conjunctivae and EOM are normal. NECK: Normal range of motion, supple, no masses SKIN: Skin is warm and dry. No rash noted. Not diaphoretic. No erythema. No pallor. NEUROLGIC: Alert and oriented to person, place, and time. Normal reflexes, muscle tone coordination. No cranial nerve deficit noted. PSYCHIATRIC:  Normal mood and affect. Normal behavior. Normal judgment and thought content. CARDIOVASCULAR: Normal heart rate noted, regular rhythm RESPIRATORY: Effort and breath sounds normal, no problems with respiration noted ABDOMEN: Soft, nontender, nondistended,Well-healed Pfannenstiel incision. PELVIC: Deferred MUSCULOSKELETAL: Normal range of motion. No edema and no tenderness. 2+ distal pulses.   Pertinent Labs/Studies:   Results for orders placed or performed during the hospital encounter of 12/15/22 (from the past 72 hour(s))  CBC     Status: Abnormal   Collection Time: 12/15/22 10:06 AM  Result Value Ref Range  WBC 5.5 4.0 - 10.5 K/uL   RBC 4.26 3.87 - 5.11 MIL/uL   Hemoglobin 11.4 (L) 12.0 - 15.0 g/dL   HCT 53.6 (L) 64.4 - 03.4 %   MCV 84.0 80.0 - 100.0 fL   MCH 26.8 26.0 - 34.0 pg   MCHC 31.8 30.0 - 36.0 g/dL   RDW 74.2 59.5 - 63.8 %   Platelets 363 150 - 400 K/uL   nRBC 0.0 0.0 - 0.2 %    Comment: Performed at [facility], [address], [place], [place] [address]  Type and screen [facility]     Status: None (Preliminary result)   Collection Time: 12/15/22 10:26 AM  Result Value Ref Range   ABO/RH(D) PENDING    Antibody Screen PENDING    Sample Expiration      12/18/2022,2359 Performed at [facility], [address] [place], [place] [address]        Assessment and Plan :BOYCE MURDOUGH is a 32 y.o. G2P1001 here for suction dilation and evacuation.   Plan for Suction D and E NPO Admission labs ordered VS Q4 Risk and benefits as above.  Pt desires ANORA testing and order was placed.   Mariel Aloe, M.D. Attending Obstetrician & Gynecologist, [facility] for [employer], [facility]

## 2022-12-15 NOTE — Anesthesia Preprocedure Evaluation (Signed)
Anesthesia Evaluation  Patient identified by MRN, date of birth, ID band Patient awake    Reviewed: Allergy & Precautions, H&P , NPO status , Patient's Chart, lab work & pertinent test results  Airway Mallampati: II       Dental no notable dental hx.    Pulmonary neg pulmonary ROS   Pulmonary exam normal        Cardiovascular negative cardio ROS Normal cardiovascular exam     Neuro/Psych  Headaches PSYCHIATRIC DISORDERS Anxiety        GI/Hepatic negative GI ROS, Neg liver ROS,,,  Endo/Other  negative endocrine ROS    Renal/GU negative Renal ROS  negative genitourinary   Musculoskeletal negative musculoskeletal ROS (+)    Abdominal  (+) + obese  Peds negative pediatric ROS (+)  Hematology  (+) Blood dyscrasia, anemia   Anesthesia Other Findings   Reproductive/Obstetrics (+) Pregnancy                             Anesthesia Physical Anesthesia Plan  ASA: 2  Anesthesia Plan: General   Post-op Pain Management:    Induction: Intravenous  PONV Risk Score and Plan: 4 or greater and Ondansetron, Dexamethasone and Midazolam  Airway Management Planned: LMA  Additional Equipment: None  Intra-op Plan:   Post-operative Plan: Extubation in OR  Informed Consent: I have reviewed the patients History and Physical, chart, labs and discussed the procedure including the risks, benefits and alternatives for the proposed anesthesia with the patient or authorized representative who has indicated his/her understanding and acceptance.     Dental advisory given  Plan Discussed with: CRNA  Anesthesia Plan Comments:        Anesthesia Quick Evaluation

## 2022-12-15 NOTE — Op Note (Signed)
Peggy Kelly PROCEDURE DATE: 12/15/2022  PREOPERATIVE DIAGNOSIS: 8 week missed abortion POSTOPERATIVE DIAGNOSIS: The same with mildly stenotic cervix PROCEDURE:     Dilation and Evacuation with ANORA testing SURGEON:  Mariel Aloe, MD  INDICATIONS: 32 y.o. G2P1001 with MAB at [redacted] weeks gestation, needing surgical completion.  Risks of surgery were discussed with the patient including but not limited to: bleeding which may require transfusion; infection which may require antibiotics; injury to uterus or surrounding organs; need for additional procedures including laparotomy or laparoscopy; possibility of intrauterine scarring which may impair future fertility; and other postoperative/anesthesia complications. Written informed consent was obtained.    FINDINGS:  A 9 week size uterus, moderate amounts of products of conception, specimen sent to pathology and for [facility] genetic testing  ANESTHESIA: General INTRAVENOUS FLUIDS:  700 ml of LR ESTIMATED BLOOD LOSS:  Less than 30 ml. UOP: 150 ml SPECIMENS:  Products of conception sent to pathology and some products of conception were sent for [facility] genetic analysis COMPLICATIONS:  None immediate.  PROCEDURE DETAILS:  The patient received intravenous Doxycycline while in the preoperative area.  She was then taken to the operating room where general anesthesia was administered and was found to be adequate.  After an adequate timeout was performed, she was placed in the dorsal lithotomy position and examined; then prepped and draped in the sterile manner.   Her bladder was catheterized for 150 ml of clear, yellow urine. A vaginal speculum was then placed in the patient's vagina and a single tooth tenaculum was applied to the anterior lip of the cervix. The cervix was dilated to a 25 hegar size.  Uterus was sounded to 9 cm.  An 8 mm suction curette was gently advanced to the uterine fundus. The suction device was then activated and curette slowly rotated to  clear the uterus of products of conception.  Suction curettage was done until complete emptying of the uterus was confirmed and the fluid in the tubing had a frothy appearance. A gentle sharp curettage was performed until a gritty texture was felt throughout.  A final suction evacuation was performed to ensure full clearance of uterine contents.There was minimal bleeding noted and the tenaculum removed with good hemostasis noted.   All instruments were removed from the patient's vagina.  Sponge and instrument counts were correct times two  The patient tolerated the procedure well and was taken to the recovery area extubated, awake, and in stable condition.  The patient will be discharged to home as per PACU criteria.  Routine postoperative instructions given.  She was prescribed Ibuprofen.  She will follow up in the office in 2-3 weeks for postoperative evaluation  Mariel Aloe, MD, FACOG Obstetrician & Gynecologist, [facility] for [facility], [facility]

## 2022-12-16 ENCOUNTER — Encounter (HOSPITAL_COMMUNITY): Payer: Self-pay | Admitting: Obstetrics and Gynecology

## 2022-12-17 NOTE — Anesthesia Postprocedure Evaluation (Signed)
Anesthesia Post Note  Patient: Peggy Kelly  Procedure(s) Performed: DILATATION AND EVACUATION (Vagina )     Patient location during evaluation: PACU Anesthesia Type: General Level of consciousness: awake and sedated Pain management: pain level controlled Vital Signs Assessment: post-procedure vital signs reviewed and stable Respiratory status: spontaneous breathing Cardiovascular status: stable Postop Assessment: no apparent nausea or vomiting Anesthetic complications: no  No notable events documented.  Last Vitals:  Vitals:   12/15/22 1415 12/15/22 1430  BP: 106/70 109/68  Pulse: 69 67  Resp: 19 18  Temp:  36.7 C  SpO2: 97% 98%    Last Pain:  Vitals:   12/15/22 1415  TempSrc:   PainSc: Asleep   Pain Goal:                   Caren Macadam

## 2022-12-19 LAB — SURGICAL PATHOLOGY

## 2022-12-21 ENCOUNTER — Encounter: Payer: Managed Care, Other (non HMO) | Admitting: Certified Nurse Midwife

## 2022-12-22 ENCOUNTER — Encounter (HOSPITAL_COMMUNITY): Payer: Self-pay

## 2022-12-22 ENCOUNTER — Emergency Department (HOSPITAL_COMMUNITY): Payer: Managed Care, Other (non HMO)

## 2022-12-22 ENCOUNTER — Other Ambulatory Visit: Payer: Self-pay | Admitting: Certified Nurse Midwife

## 2022-12-22 ENCOUNTER — Emergency Department (HOSPITAL_COMMUNITY)
Admission: EM | Admit: 2022-12-22 | Discharge: 2022-12-22 | Disposition: A | Discharge status: Home / Self Care | Payer: Managed Care, Other (non HMO) | Attending: Emergency Medicine | Admitting: Emergency Medicine

## 2022-12-22 DIAGNOSIS — R519 Headache, unspecified: Secondary | ICD-10-CM | POA: Diagnosis present

## 2022-12-22 DIAGNOSIS — F419 Anxiety disorder, unspecified: Secondary | ICD-10-CM

## 2022-12-22 DIAGNOSIS — G43809 Other migraine, not intractable, without status migrainosus: Secondary | ICD-10-CM | POA: Diagnosis not present

## 2022-12-22 LAB — CBC WITH DIFFERENTIAL/PLATELET
Abs Immature Granulocytes: 0.04 10*3/uL (ref 0.00–0.07)
Basophils Absolute: 0 10*3/uL (ref 0.0–0.1)
Basophils Relative: 1 %
Eosinophils Absolute: 0.2 10*3/uL (ref 0.0–0.5)
Eosinophils Relative: 2 %
HCT: 31.2 % — ABNORMAL LOW (ref 36.0–46.0)
Hemoglobin: 9.7 g/dL — ABNORMAL LOW (ref 12.0–15.0)
Immature Granulocytes: 1 %
Lymphocytes Relative: 39 %
Lymphs Abs: 2.8 10*3/uL (ref 0.7–4.0)
MCH: 26.9 pg (ref 26.0–34.0)
MCHC: 31.1 g/dL (ref 30.0–36.0)
MCV: 86.4 fL (ref 80.0–100.0)
Monocytes Absolute: 0.5 10*3/uL (ref 0.1–1.0)
Monocytes Relative: 7 %
Neutro Abs: 3.6 10*3/uL (ref 1.7–7.7)
Neutrophils Relative %: 50 %
Platelets: 346 10*3/uL (ref 150–400)
RBC: 3.61 MIL/uL — ABNORMAL LOW (ref 3.87–5.11)
RDW: 13.7 % (ref 11.5–15.5)
WBC: 7.1 10*3/uL (ref 4.0–10.5)
nRBC: 0 % (ref 0.0–0.2)

## 2022-12-22 LAB — COMPREHENSIVE METABOLIC PANEL
ALT: 17 U/L (ref 0–44)
AST: 16 U/L (ref 15–41)
Albumin: 3 g/dL — ABNORMAL LOW (ref 3.5–5.0)
Alkaline Phosphatase: 43 U/L (ref 38–126)
Anion gap: 6 (ref 5–15)
BUN: 8 mg/dL (ref 6–20)
CO2: 25 mmol/L (ref 22–32)
Calcium: 8.9 mg/dL (ref 8.9–10.3)
Chloride: 106 mmol/L (ref 98–111)
Creatinine, Ser: 0.65 mg/dL (ref 0.44–1.00)
GFR, Estimated: >60 mL/min (ref >60)
Glucose, Bld: 97 mg/dL (ref 70–99)
Potassium: 3.7 mmol/L (ref 3.5–5.1)
Sodium: 137 mmol/L (ref 135–145)
Total Bilirubin: 0.5 mg/dL (ref <1.2)
Total Protein: 6.4 g/dL — ABNORMAL LOW (ref 6.5–8.1)

## 2022-12-22 MED ORDER — METOCLOPRAMIDE HCL 5 MG/ML IJ SOLN
10.0000 mg | Freq: Once | INTRAMUSCULAR | Status: AC
  Administered 2022-12-22: 10 mg via INTRAVENOUS
  Filled 2022-12-22: qty 2

## 2022-12-22 MED ORDER — LACTATED RINGERS IV BOLUS
1000.0000 mL | Freq: Once | INTRAVENOUS | Status: AC
Start: 2022-12-22 — End: 2022-12-22
  Administered 2022-12-22: 1000 mL via INTRAVENOUS

## 2022-12-22 MED ORDER — DIPHENHYDRAMINE HCL 50 MG/ML IJ SOLN
25.0000 mg | Freq: Once | INTRAMUSCULAR | Status: AC
  Administered 2022-12-22: 25 mg via INTRAVENOUS
  Filled 2022-12-22: qty 1

## 2022-12-22 MED ORDER — DEXAMETHASONE SODIUM PHOSPHATE 10 MG/ML IJ SOLN
10.0000 mg | Freq: Once | INTRAMUSCULAR | Status: AC
  Administered 2022-12-22: 10 mg via INTRAVENOUS
  Filled 2022-12-22: qty 1

## 2022-12-22 MED ORDER — KETOROLAC TROMETHAMINE 15 MG/ML IJ SOLN
15.0000 mg | Freq: Once | INTRAMUSCULAR | Status: AC
  Administered 2022-12-22: 15 mg via INTRAVENOUS
  Filled 2022-12-22: qty 1

## 2022-12-22 NOTE — Discharge Instructions (Addendum)
You were seen today for headache.  Your CT scan and lab work were reassuring.  You can continue to take Tylenol or Motrin at home.  If you develop persistent fever, severe headache that is worsening, or any other new concerning symptoms you should return to the ED.

## 2022-12-22 NOTE — ED Provider Notes (Signed)
[place] EMERGENCY DEPARTMENT AT [facility] Provider Note   CSN: [id number] Arrival date & time: 12/22/22  2029     History  Chief Complaint  Patient presents with   Migraine    Peggy Kelly is a 32 y.o. female.   Migraine  32 year old female history of migraines presenting for headache.  She is headache for about 3 days.  It is anterior although she has some pain along the lateral sides below the ears.  No ear pain or sore throat.  No neck stiffness.  No fevers or chills.  No vomiting.  No vision changes or weakness or numbness.  A week ago she had a D&C for a miscarriage.  Her bleeding is improving, no vaginal discharge and no significant abdominal pain.  No chest pain or shortness of breath.  Headache is somewhat similar to prior migraines but worse.  She is taken her triptan at home without significant relief as well as Tylenol and ibuprofen.     Home Medications Prior to Admission medications   Medication Sig Start Date End Date Taking? Authorizing Provider  Butalbital-APAP-Caffeine [phone] MG capsule Take 1-2 capsules by mouth every 6 (six) hours as needed for headache. 08/26/22   Glyn Ade, Scot Jun, PA-C  fluocinolone (SYNALAR) 0.01 % external solution Apply topically daily. Patient taking differently: Apply 1 Application topically daily as needed (scalp irritation.). 11/02/22   Bernerd Limbo, CNM  ibuprofen (ADVIL) 600 MG tablet Take 1 tablet (600 mg total) by mouth every 6 (six) hours as needed for headache, mild pain (pain score 1-3), moderate pain (pain score 4-6) or cramping. 12/15/22   Warden Fillers, MD  ketoconazole (NIZORAL) 2 % shampoo Apply topically 2 (two) times a week. Patient taking differently: Apply 1 Application topically daily as needed (scalp irritation.). 11/03/22   Bernerd Limbo, CNM  Magnesium Oxide -Mg Supplement (MAG-OXIDE) 200 MG TABS Take 2 tablets (400 mg total) by mouth at bedtime. If that amount causes loose stools  in the am, switch to 200mg  daily at bedtime. Patient taking differently: Take 200 mg by mouth at bedtime. 03/30/22   Bernerd Limbo, CNM  rizatriptan (MAXALT) 10 MG tablet Take 1 tablet (10 mg total) by mouth as needed for migraine. May repeat in 2 hours if needed 09/23/22   Bernerd Limbo, CNM  SUMAtriptan (IMITREX) 100 MG tablet Take 1 tablet (100 mg total) by mouth once as needed for up to 1 dose for migraine. May repeat in 2 hours if headache persists or recurs. 08/26/22   Glyn Ade, Scot Jun, PA-C  traZODone (DESYREL) 50 MG tablet TAKE 1 TABLET(50 MG) BY MOUTH TWICE DAILY AS NEEDED FOR SLEEP 12/22/22   Edd Arbour R, CNM      Allergies    Penicillins    Review of Systems   Review of Systems Review of systems completed and notable as per HPI.  ROS otherwise negative.   Physical Exam Updated Vital Signs BP (!) 134/90   Pulse 86   Temp 98.4 F (36.9 C)   Resp 18   LMP 09/30/2022 (Exact Date)   SpO2 100%  Physical Exam Vitals and nursing note reviewed.  Constitutional:      General: She is not in acute distress.    Appearance: She is well-developed.  HENT:     Head: Normocephalic and atraumatic.     Nose: Nose normal.     Mouth/Throat:     Mouth: Mucous membranes are moist.  Pharynx: Oropharynx is clear. No oropharyngeal exudate or posterior oropharyngeal erythema.  Eyes:     Extraocular Movements: Extraocular movements intact.     Conjunctiva/sclera: Conjunctivae normal.     Pupils: Pupils are equal, round, and reactive to light.  Neck:     Comments: No meningismus, negative Kernig and Brudzinski Cardiovascular:     Rate and Rhythm: Normal rate and regular rhythm.     Pulses: Normal pulses.     Heart sounds: Normal heart sounds. No murmur heard. Pulmonary:     Effort: Pulmonary effort is normal. No respiratory distress.     Breath sounds: Normal breath sounds.  Abdominal:     Palpations: Abdomen is soft.     Tenderness: There is no abdominal tenderness.  There is no guarding or rebound.  Musculoskeletal:        General: No swelling.     Cervical back: Normal range of motion and neck supple. No rigidity or tenderness.     Right lower leg: No edema.     Left lower leg: No edema.  Lymphadenopathy:     Cervical: No cervical adenopathy.  Skin:    General: Skin is warm and dry.     Capillary Refill: Capillary refill takes less than 2 seconds.  Neurological:     General: No focal deficit present.     Mental Status: She is alert and oriented to person, place, and time. Mental status is at baseline.     Cranial Nerves: No cranial nerve deficit.     Sensory: No sensory deficit.     Motor: No weakness.     Coordination: Coordination normal.     Gait: Gait normal.     Deep Tendon Reflexes: Reflexes normal.  Psychiatric:        Mood and Affect: Mood normal.     ED Results / Procedures / Treatments   Labs (all labs ordered are listed, but only abnormal results are displayed) Labs Reviewed  CBC WITH DIFFERENTIAL/PLATELET - Abnormal; Notable for the following components:      Result Value   RBC 3.61 (*)    Hemoglobin 9.7 (*)    HCT 31.2 (*)    All other components within normal limits  COMPREHENSIVE METABOLIC PANEL - Abnormal; Notable for the following components:   Total Protein 6.4 (*)    Albumin 3.0 (*)    All other components within normal limits    EKG None  Radiology CT Head Wo Contrast  Result Date: 12/22/2022 CLINICAL DATA:  Headache EXAM: CT HEAD WITHOUT CONTRAST TECHNIQUE: Contiguous axial images were obtained from the base of the skull through the vertex without intravenous contrast. RADIATION DOSE REDUCTION: This exam was performed according to the departmental dose-optimization program which includes automated exposure control, adjustment of the mA and/or kV according to patient size and/or use of iterative reconstruction technique. COMPARISON:  None Available. FINDINGS: Brain: No evidence of acute infarction, hemorrhage,  hydrocephalus, extra-axial collection or mass lesion/mass effect. Vascular: No hyperdense vessel or unexpected calcification. Skull: Normal. Negative for fracture or focal lesion. Sinuses/Orbits: No acute finding. Other: None IMPRESSION: Negative non contrasted CT appearance of the brain. Electronically Signed   By: Jasmine Pang M.D.   On: 12/22/2022 23:02    Procedures Procedures    Medications Ordered in ED Medications  lactated ringers bolus 1,000 mL (0 mLs Intravenous Stopped 12/22/22 2324)  metoCLOPramide (REGLAN) injection 10 mg (10 mg Intravenous Given 12/22/22 2210)  diphenhydrAMINE (BENADRYL) injection 25 mg (25 mg Intravenous Given  12/22/22 2210)  dexamethasone (DECADRON) injection 10 mg (10 mg Intravenous Given 12/22/22 2211)  ketorolac (TORADOL) 15 MG/ML injection 15 mg (15 mg Intravenous Given 12/22/22 2322)    ED Course/ Medical Decision Making/ A&P Clinical Course as of 12/22/22 2333  Thu Dec 22, 2022  2321 On reassessment headache improved.  Labwork is reassuring.  Remains with normal neurologic exam.  CT head reviewed, no concerning findings.  Still has mild headache, will give dose of Toradol here.  Recommend close PCP follow-up.  Strict turn precautions given. [JD]    Clinical Course User Index [JD] Laurence Spates, MD                                 Medical Decision Making Amount and/or Complexity of Data Reviewed Radiology: ordered.  Risk Prescription drug management.   Medical Decision Making:   YANISHA PERRON is a 32 y.o. female who presented to the ED today with 3 days of headache.  Vital signs reviewed.  On exam she is well-appearing.  Normal neurologic exam without deficit.  She is not any fevers, no meningismus have low concern for meningitis.  No leukocytosis either.  She had recent D&C, no abdominal tenderness or pain I do not think that is causing her symptoms.  She is history of migraines which could be causing her pain, as well as increase  stress.  However given change in headache and worsening headache obtain CT head to evaluate.  Was not sudden onset, lower suspicion for subarachnoid hemorrhage.   Patient placed on continuous vitals and telemetry monitoring while in ED which was reviewed periodically.  Reviewed and confirmed nursing documentation for past medical history, family history, social history.  Reassessment and Plan:   Lower abdominal for hemoglobin down at 1 point.  She had recent D&C, no signs of hemoglobin significant blood loss or presyncope.  I discussed this with the patient and recommend she keep track of any vaginal bleeding if she has any worsening symptoms related this to return.  Her headache is improved.  Remains with normal neurologic exam.  Mild headache remains, given normal CT head will add on Toradol.  CT head does not show any concerning findings.  I have low special for subarachnoid hemorrhage, dural venous sinus thrombosis or other acute intracranial pathology.  I think she can follow-up with her PCP as an outpatient.  I did give her strict return precautions for develop fever, neck stiffness, neurologic changes or any other concerning findings.   Patient's presentation is most consistent with acute complicated illness / injury requiring diagnostic workup.           Final Clinical Impression(s) / ED Diagnoses Final diagnoses:  Other migraine without status migrainosus, not intractable    Rx / DC Orders ED Discharge Orders     None         Laurence Spates, MD 12/22/22 2333

## 2022-12-22 NOTE — ED Notes (Signed)
Patient verbalizes understanding of discharge instructions. Opportunity for questioning and answers were provided. Armband removed by staff, pt discharged from ED. Wheeled out to car with family

## 2022-12-22 NOTE — ED Triage Notes (Signed)
Peggy Kelly is coming in for a migraine that started 3 days ago, she says it is worse than her typical migraines, She also mentions that her migraines are usually due to hormonal reasons. This could be related to a recent miscarriage that she had a D/c for on Thursday. She is having some neck tightness, vision blurriness and 10/10 headache.

## 2022-12-23 ENCOUNTER — Other Ambulatory Visit: Payer: Self-pay | Admitting: Certified Nurse Midwife

## 2022-12-23 DIAGNOSIS — Z8669 Personal history of other diseases of the nervous system and sense organs: Secondary | ICD-10-CM

## 2022-12-26 MED ORDER — RIZATRIPTAN BENZOATE 10 MG PO TABS: 10.0000 mg | ORAL_TABLET | ORAL | 0 refills | Status: DC | PRN

## 2022-12-30 DIAGNOSIS — Z0289 Encounter for other administrative examinations: Secondary | ICD-10-CM

## 2023-01-05 ENCOUNTER — Ambulatory Visit: Payer: Managed Care, Other (non HMO) | Admitting: Obstetrics and Gynecology

## 2023-01-06 LAB — ANORA MISCARRIAGE TEST - FRESH

## 2023-01-10 ENCOUNTER — Encounter: Payer: Self-pay | Admitting: Certified Nurse Midwife

## 2023-01-10 DIAGNOSIS — Z8759 Personal history of other complications of pregnancy, childbirth and the puerperium: Secondary | ICD-10-CM | POA: Insufficient documentation

## 2023-01-11 ENCOUNTER — Encounter: Payer: Self-pay | Admitting: Certified Nurse Midwife

## 2023-01-11 ENCOUNTER — Other Ambulatory Visit: Payer: Self-pay | Admitting: Certified Nurse Midwife

## 2023-01-11 ENCOUNTER — Other Ambulatory Visit: Payer: Self-pay

## 2023-01-11 ENCOUNTER — Ambulatory Visit (INDEPENDENT_AMBULATORY_CARE_PROVIDER_SITE_OTHER): Payer: Managed Care, Other (non HMO) | Admitting: Certified Nurse Midwife

## 2023-01-11 VITALS — BP 124/74 | HR 93

## 2023-01-11 DIAGNOSIS — Z8759 Personal history of other complications of pregnancy, childbirth and the puerperium: Secondary | ICD-10-CM

## 2023-01-11 DIAGNOSIS — N939 Abnormal uterine and vaginal bleeding, unspecified: Secondary | ICD-10-CM

## 2023-01-11 DIAGNOSIS — F419 Anxiety disorder, unspecified: Secondary | ICD-10-CM

## 2023-01-11 DIAGNOSIS — O039 Complete or unspecified spontaneous abortion without complication: Secondary | ICD-10-CM

## 2023-01-11 DIAGNOSIS — G43009 Migraine without aura, not intractable, without status migrainosus: Secondary | ICD-10-CM

## 2023-01-11 DIAGNOSIS — Z3A08 8 weeks gestation of pregnancy: Secondary | ICD-10-CM

## 2023-01-12 LAB — CBC
Hematocrit: 31.7 % — ABNORMAL LOW (ref 34.0–46.6)
Hemoglobin: 10.3 g/dL — ABNORMAL LOW (ref 11.1–15.9)
MCH: 27.4 pg (ref 26.6–33.0)
MCHC: 32.5 g/dL (ref 31.5–35.7)
MCV: 84 fL (ref 79–97)
Platelets: 361 10*3/uL (ref 150–450)
RBC: 3.76 x10E6/uL — ABNORMAL LOW (ref 3.77–5.28)
RDW: 12.7 % (ref 11.7–15.4)
WBC: 5.2 10*3/uL (ref 3.4–10.8)

## 2023-01-12 LAB — BETA HCG QUANT (REF LAB): hCG Quant: 4 m[IU]/mL

## 2023-01-13 ENCOUNTER — Ambulatory Visit (HOSPITAL_COMMUNITY)
Admission: RE | Admit: 2023-01-13 | Discharge: 2023-01-13 | Disposition: A | Discharge status: Home / Self Care | Payer: Managed Care, Other (non HMO) | Source: Ambulatory Visit | Attending: Certified Nurse Midwife | Admitting: Certified Nurse Midwife

## 2023-01-13 DIAGNOSIS — N939 Abnormal uterine and vaginal bleeding, unspecified: Secondary | ICD-10-CM | POA: Insufficient documentation

## 2023-01-13 DIAGNOSIS — N888 Other specified noninflammatory disorders of cervix uteri: Secondary | ICD-10-CM | POA: Diagnosis not present

## 2023-01-13 DIAGNOSIS — N83291 Other ovarian cyst, right side: Secondary | ICD-10-CM | POA: Diagnosis not present

## 2023-01-13 DIAGNOSIS — N8301 Follicular cyst of right ovary: Secondary | ICD-10-CM | POA: Diagnosis not present

## 2023-01-13 NOTE — Progress Notes (Signed)
   Subjective:   Patient Name: Peggy Kelly, female   DOB: 18-Feb-1990, 32 y.o.  MRN: [id number]  HPI Seen for follow up of SAB 4 weeks ago. Has continued bleeding off and on (mostly on, has never been less than brown spotting) since her D&C. Has passed several large clots (has pictures) and is getting concerned. Does not plan on getting pregnant again soon, does not need birth control as husband has secondary infertility.  Review of Systems Pertinent items noted in HPI and remainder of comprehensive ROS otherwise negative.     Objective:  BP 124/74   Pulse 93   LMP 09/30/2022 (Exact Date)   Breastfeeding Unknown   Physical Exam  Constitutional: She is oriented to person, place, and time. She appears well-developed and well-nourished.  Cardiovascular: Normal rate.  Abdominal: Soft. She exhibits no distension.  Neurological: She is alert and oriented to person, place, and time.  Skin: Skin is warm and dry.  Psychiatric: She has a normal mood and affect. Her behavior is normal. Judgment and thought content normal.   Assessment & Plan:  1. Miscarriage (Primary) - Continued bleeding, still feeling anxious and upset about situation. Taking trazadone for sleep and has restarted her strattera. - CBC  2. Migraine without aura and without status migrainosus, not intractable - Stable on meds  4. Abnormal uterine bleeding (AUB) - Discussed with Dr. Macon Large who recommended repeat bHCG and TVUS, will follow and manage accordingly - Beta hCG quant (ref lab) - US PELVIC COMPLETE WITH TRANSVAGINAL; Future   Follow up PRN.  Edd Arbour, CNM, MSN, IBCLC Certified Nurse Midwife, [facility]

## 2023-01-29 ENCOUNTER — Other Ambulatory Visit: Payer: Self-pay | Admitting: Certified Nurse Midwife

## 2023-01-29 DIAGNOSIS — Z8669 Personal history of other diseases of the nervous system and sense organs: Secondary | ICD-10-CM

## 2023-02-06 DIAGNOSIS — M25562 Pain in left knee: Secondary | ICD-10-CM | POA: Insufficient documentation

## 2023-02-07 DIAGNOSIS — F419 Anxiety disorder, unspecified: Secondary | ICD-10-CM | POA: Insufficient documentation

## 2023-02-07 NOTE — Progress Notes (Signed)
 History:  Ms. Peggy Kelly is a 33 y.o. G2P1001 who presents to clinic today for follow up after her miscarriage in November.   She continues to have a significant amount of anxiety related to returning to work in the same position. She continues to need trazadone to sleep well at night, and requests an increase in her ADHD/anxiety meds. She is still having intrusive thoughts and processing her grief and trauma around the loss. She has recently started care with a new therapist and feels hopeful/energized about that but is not ready to return to work.    She also began bleeding again, this is much more like a normal period but she is feeling fatigued easily.  The following portions of the patient's history were reviewed and updated as appropriate: allergies, current medications, family history, past medical history, social history, past surgical history and problem list.  Review of Systems:  Pertinent items noted in HPI and remainder of comprehensive ROS otherwise negative.  Objective:  Physical Exam BP 127/74   Pulse 88   Wt 197 lb 14.4 oz (89.8 kg)   LMP 02/02/2023 (Exact Date)   Breastfeeding No   BMI 33.97 kg/m  Physical Exam Constitutional:      Appearance: Normal appearance.  Cardiovascular:     Rate and Rhythm: Normal rate and regular rhythm.  Pulmonary:     Effort: Pulmonary effort is normal.  Musculoskeletal:        General: Normal range of motion.  Skin:    General: Skin is warm and dry.     Capillary Refill: Capillary refill takes less than 2 seconds.  Neurological:     Mental Status: She is alert and oriented to person, place, and time.  Psychiatric:        Mood and Affect: Mood normal.        Behavior: Behavior normal.    Labs and Imaging No results found for this or any previous visit (from the past 24 hours).  No results found.   Assessment & Plan:  1. History of miscarriage (Primary) - Continues to process her grief, which is exacerbated by this  potentially being the last time they try for another baby.  2. Migraine without aura and without status migrainosus, not intractable - Standard meds have stopped working, discussed possibility that her migraines are related to a food sensitivity, she is going to experiment with possible foodborne triggers  3. Anxiety - Will increase Strattera  and order refills for trazadone - Recommend remaining out of work until at least Feb 15 as she adjusts to her med increase and additional (weekly) therapy. - atomoxetine  (STRATTERA ) 25 MG capsule; Take 1 capsule (25 mg total) by mouth daily.  Dispense: 30 capsule; Refill: 6 - traZODone  (DESYREL ) 50 MG tablet; Take 1 tablet (50 mg total) by mouth at bedtime.  Dispense: 30 tablet; Refill: 4  4. History of anemia - CBC   Follow up PRN.  Derick Fleeting, [place] 02/10/2023 6:32 PM

## 2023-02-08 ENCOUNTER — Other Ambulatory Visit: Payer: Self-pay

## 2023-02-08 ENCOUNTER — Encounter: Payer: Self-pay | Admitting: Certified Nurse Midwife

## 2023-02-08 ENCOUNTER — Ambulatory Visit: Payer: Managed Care, Other (non HMO) | Admitting: Certified Nurse Midwife

## 2023-02-08 VITALS — BP 127/74 | HR 88 | Wt 197.9 lb

## 2023-02-08 DIAGNOSIS — Z862 Personal history of diseases of the blood and blood-forming organs and certain disorders involving the immune mechanism: Secondary | ICD-10-CM

## 2023-02-08 DIAGNOSIS — F419 Anxiety disorder, unspecified: Secondary | ICD-10-CM | POA: Diagnosis not present

## 2023-02-08 DIAGNOSIS — Z1331 Encounter for screening for depression: Secondary | ICD-10-CM | POA: Diagnosis not present

## 2023-02-08 DIAGNOSIS — G43009 Migraine without aura, not intractable, without status migrainosus: Secondary | ICD-10-CM

## 2023-02-08 DIAGNOSIS — Z8759 Personal history of other complications of pregnancy, childbirth and the puerperium: Secondary | ICD-10-CM

## 2023-02-08 LAB — CBC
Hematocrit: 32.3 % — ABNORMAL LOW (ref 34.0–46.6)
Hemoglobin: 10.1 g/dL — ABNORMAL LOW (ref 11.1–15.9)
MCH: 26 pg — ABNORMAL LOW (ref 26.6–33.0)
MCHC: 31.3 g/dL — ABNORMAL LOW (ref 31.5–35.7)
MCV: 83 fL (ref 79–97)
Platelets: 384 10*3/uL (ref 150–450)
RBC: 3.89 x10E6/uL (ref 3.77–5.28)
RDW: 12.7 % (ref 11.7–15.4)
WBC: 5 10*3/uL (ref 3.4–10.8)

## 2023-02-10 MED ORDER — TRAZODONE HCL 50 MG PO TABS: 50.0000 mg | ORAL_TABLET | Freq: Every day | ORAL | 4 refills | Status: DC

## 2023-02-10 MED ORDER — ATOMOXETINE HCL 25 MG PO CAPS
25.0000 mg | ORAL_CAPSULE | Freq: Every day | ORAL | 6 refills | Status: DC
Start: 2023-02-10 — End: 2023-06-21

## 2023-02-17 DIAGNOSIS — M25562 Pain in left knee: Secondary | ICD-10-CM | POA: Diagnosis not present

## 2023-02-18 ENCOUNTER — Other Ambulatory Visit: Payer: Self-pay | Admitting: Certified Nurse Midwife

## 2023-02-18 DIAGNOSIS — F419 Anxiety disorder, unspecified: Secondary | ICD-10-CM

## 2023-02-21 ENCOUNTER — Telehealth: Payer: Self-pay | Admitting: Family Medicine

## 2023-02-21 NOTE — Telephone Encounter (Signed)
Attempted to contact the patient regarding questions about her FMLA paperwork. Left a voicemail requesting her to call the office.

## 2023-03-01 ENCOUNTER — Other Ambulatory Visit: Payer: Self-pay | Admitting: Medical

## 2023-03-01 ENCOUNTER — Encounter: Payer: Self-pay | Admitting: Internal Medicine

## 2023-03-01 DIAGNOSIS — Z8669 Personal history of other diseases of the nervous system and sense organs: Secondary | ICD-10-CM

## 2023-03-06 DIAGNOSIS — M25562 Pain in left knee: Secondary | ICD-10-CM | POA: Diagnosis not present

## 2023-03-08 DIAGNOSIS — G43101 Migraine with aura, not intractable, with status migrainosus: Secondary | ICD-10-CM | POA: Diagnosis not present

## 2023-03-09 ENCOUNTER — Ambulatory Visit: Payer: Managed Care, Other (non HMO) | Admitting: Dermatology

## 2023-03-09 ENCOUNTER — Encounter: Payer: Self-pay | Admitting: Dermatology

## 2023-03-09 VITALS — BP 111/75 | HR 73

## 2023-03-09 DIAGNOSIS — L700 Acne vulgaris: Secondary | ICD-10-CM

## 2023-03-09 DIAGNOSIS — L810 Postinflammatory hyperpigmentation: Secondary | ICD-10-CM

## 2023-03-09 DIAGNOSIS — L309 Dermatitis, unspecified: Secondary | ICD-10-CM

## 2023-03-09 DIAGNOSIS — L219 Seborrheic dermatitis, unspecified: Secondary | ICD-10-CM

## 2023-03-09 MED ORDER — CLOBETASOL PROPIONATE 0.05 % EX CREA: TOPICAL_CREAM | CUTANEOUS | 2 refills | Status: DC

## 2023-03-09 MED ORDER — FLUOCINOLONE ACETONIDE SCALP 0.01 % EX OIL: TOPICAL_OIL | CUTANEOUS | 3 refills | Status: AC

## 2023-03-09 MED ORDER — CLOBETASOL PROPIONATE 0.05 % EX SOLN: CUTANEOUS | 2 refills | Status: DC

## 2023-03-09 NOTE — Progress Notes (Signed)
   New Patient Visit   Subjective  Peggy Kelly is a 33 y.o. female who presents for the following: Scalp issues. States she has seborrheic dermatitis. Has been using Ketoconazole 2% shampoo every week and fluocinolone solution daily as needed for itching/irritation. Would like to know if there may be something that works better.  Used new shampoo that has a cream the last time shampooing. States she has noticed a difference in her condition.   C/O eczema on posterior neck. Has used Triamcinolone in the past.   The following portions of the chart were reviewed this encounter and updated as appropriate: medications, allergies, medical history  Review of Systems:  No other skin or systemic complaints except as noted in HPI or Assessment and Plan.  Objective  Well appearing patient in no apparent distress; mood and affect are within normal limits.  A focused examination was performed of the following areas: Scalp, face  Relevant exam findings are noted in the Assessment and Plan.           Assessment & Plan   SEBORRHEIC DERMATITIS Exam: Pink patches with greasy scale   Seborrheic Dermatitis is a chronic persistent rash characterized by pinkness and scaling most commonly of the mid face but also can occur on the scalp (dandruff), ears; mid chest, mid back and groin.  It tends to be exacerbated by stress and cooler weather.  People who have neurologic disease may experience new onset or exacerbation of existing seborrheic dermatitis.  The condition is not curable but treatable and can be controlled.  Treatment Plan:    Recommend using DHS Zinc shampoo Start Derma-smoothe Scalp oil apply to affected areas on scalp at least a couple of hours prior to washing hair.  Start Clobetasol 0.05% solution twice daily to affected areas on scalp as needed for itching/irritation.   Eczematous Dermatitis  Exam: Scaly, hyperpigmented plaque at posterior neck  Treatment Plan: Start  Clobetasol cream twice a day to affected areas on body up to 2 weeks as needed for eczema. Avoid applying to face, groin, and axilla. Use as directed. Long-term use can cause thinning of the skin.  Topical steroids (such as triamcinolone, fluocinolone, fluocinonide, mometasone, clobetasol, halobetasol, betamethasone, hydrocortisone) can cause thinning and lightening of the skin if they are used for too long in the same area. Your physician has selected the right strength medicine for your problem and area affected on the body. Please use your medication only as directed by your physician to prevent side effects.    ACNE VULGARIS, With PIH Exam: Open comedones and inflammatory papules at face  Chronic and persistent condition with duration or expected duration over one year. Condition is bothersome/symptomatic for patient. Currently flared.   Treatment Plan: La Roche-Posay hydrating gentle wash, Toleriane UV moisturizer for morning. Toleriane Double repair moisturizer at bedtime over Effaclar.  Start Effaclar (Adapalene) 0.1% gel apply to face every other night at bedtime    Return in about 5 months (around 08/06/2023) for Acne Follow Up, Seborrheic Dermatitis Follow Up.  I, Lawson Radar, CMA, am acting as scribe for [employer], DO.   Documentation: I have reviewed the above documentation for accuracy and completeness, and I agree with the above.  Langston Reusing, DO

## 2023-03-09 NOTE — Patient Instructions (Addendum)
Hello Peggy Kelly,   Thank you for visiting [place] today. Your dedication to enhancing your dermatological health is greatly appreciated. Below is a summary of the essential instructions from our discussion:  Scalp Care:   Discontinue Use: Stop using ketoconazole wash due to its drying effects.   New Shampoo: Begin using DHS Zinc Shampoo containing 2% zinc for a less drying effect. Alternate with your regular hydrating shampoo and conditioner.   Scalp Oil: Use DermaSmooth Scalp Oil overnight or a few hours before washing to hydrate the scalp. It is available at [facility] pharmacy for $40.   Targeted Treatment: Apply Clobetasol drops before shampooing for severe symptoms.  Eczema Management:   Cream Switch: Transition to Clobetasol cream, applying twice daily for up to 2 weeks. Refills available as needed.   Alternative Option: If eczema persists, consider Tacrolimus as a non-steroid alternative.  Facial Skin Care:   Gentle Regimen: Start using La Roche-Posay products: hydrating wash, Toleriane moisturizer, and adapalene cream.   Adapalene Application: Use adapalene cream every other night, followed by moisturizer. Apply a pea-sized amount.   Sun Protection: Ensure daily sunscreen use to protect against hyperpigmentation. Morning Routine: Wash with La Roche-Posay, apply moisturizer with sunscreen.   Night Routine: Wash, apply adapalene every other night, followed by moisturizer. On non-adapalene nights, just wash and moisturize.   General Advice:   Hair Care: For sew-ins, ensure they are not too tight and use Clobetasol for any resultant itching.    Please adhere to these instructions carefully and do not hesitate to reach out with any questions or concerns. During your next visit, we will take a baseline picture of your face for comparison as we monitor your progress.  Warm regards,  Dr. Langston Reusing [facility]ermat[facility]       Important Information  Due to recent changes in healthcare laws,  you may see results of your pathology and/or laboratory studies on MyChart before the doctors have had a chance to review them. We understand that in some cases there may be results that are confusing or concerning to you. Please understand that not all results are received at the same time and often the doctors may need to interpret multiple results in order to provide you with the best plan of care or course of treatment. Therefore, we ask that you please give [place] 2 business days to thoroughly review all your results before contacting the office for clarification. Should we see a critical lab result, you will be contacted sooner.   If You Need Anything After Your Visit  If you have any questions or concerns for your doctor, please call our main line at [phone] If no one answers, please leave a voicemail as directed and we will return your call as soon as possible. Messages left after 4 pm will be answered the following business day.   You may also send [place] a message via MyChart. We typically respond to MyChart messages within 1-2 business days.  For prescription refills, please ask your pharmacy to contact our office. Our fax number is [phone].  If you have an urgent issue when the clinic is closed that cannot wait until the next business day, you can page your doctor at the number below.    Please note that while we do our best to be available for urgent issues outside of office hours, we are not available 24/7.   If you have an urgent issue and are unable to reach [place], you may choose to seek medical care at  your doctor's office, retail clinic, urgent care center, or emergency room.  If you have a medical emergency, please immediately call 911 or go to the emergency department. In the event of inclement weather, please call our main line at [phone] for an update on the status of any delays or closures.  Dermatology Medication Tips: Please keep the boxes that topical medications  come in in order to help keep track of the instructions about where and how to use these. Pharmacies typically print the medication instructions only on the boxes and not directly on the medication tubes.   If your medication is too expensive, please contact our office at [phone] or send [place] a message through MyChart.   We are unable to tell what your co-pay for medications will be in advance as this is different depending on your insurance coverage. However, we may be able to find a substitute medication at lower cost or fill out paperwork to get insurance to cover a needed medication.   If a prior authorization is required to get your medication covered by your insurance company, please allow [place] 1-2 business days to complete this process.  Drug prices often vary depending on where the prescription is filled and some pharmacies may offer cheaper prices.  The website www.goodrx.com contains coupons for medications through different pharmacies. The prices here do not account for what the cost may be with help from insurance (it may be cheaper with your insurance), but the website can give you the price if you did not use any insurance.  - You can print the associated coupon and take it with your prescription to the pharmacy.  - You may also stop by our office during regular business hours and pick up a GoodRx coupon card.  - If you need your prescription sent electronically to a different pharmacy, notify our office through [facility] or by phone at [phone]

## 2023-03-15 DIAGNOSIS — M25562 Pain in left knee: Secondary | ICD-10-CM | POA: Diagnosis not present

## 2023-03-21 DIAGNOSIS — M25562 Pain in left knee: Secondary | ICD-10-CM | POA: Diagnosis not present

## 2023-03-24 DIAGNOSIS — M25562 Pain in left knee: Secondary | ICD-10-CM | POA: Diagnosis not present

## 2023-03-29 DIAGNOSIS — M25562 Pain in left knee: Secondary | ICD-10-CM | POA: Diagnosis not present

## 2023-03-31 DIAGNOSIS — M25562 Pain in left knee: Secondary | ICD-10-CM | POA: Diagnosis not present

## 2023-04-06 DIAGNOSIS — M25562 Pain in left knee: Secondary | ICD-10-CM | POA: Diagnosis not present

## 2023-04-17 ENCOUNTER — Other Ambulatory Visit: Payer: Self-pay

## 2023-04-17 DIAGNOSIS — Z8669 Personal history of other diseases of the nervous system and sense organs: Secondary | ICD-10-CM

## 2023-04-17 MED ORDER — RIZATRIPTAN BENZOATE 10 MG PO TABS: 10.0000 mg | ORAL_TABLET | ORAL | 0 refills | Status: DC | PRN

## 2023-04-17 NOTE — Progress Notes (Signed)
 Patient requesting refill of rizatriptan PRN. Refill ordered. Recommended patient follow up with Nada Maclachlan, PA for ongoing migraine treatment and prevention. Will send message to [facility] office to reach out to patient.

## 2023-04-18 ENCOUNTER — Encounter: Payer: Self-pay | Admitting: Oncology

## 2023-04-21 ENCOUNTER — Encounter: Payer: Self-pay | Admitting: Oncology

## 2023-04-21 ENCOUNTER — Ambulatory Visit: Admitting: Physician Assistant

## 2023-04-21 ENCOUNTER — Encounter: Payer: Self-pay | Admitting: Physician Assistant

## 2023-04-21 VITALS — BP 119/81 | HR 82 | Wt 207.0 lb

## 2023-04-21 DIAGNOSIS — G43009 Migraine without aura, not intractable, without status migrainosus: Secondary | ICD-10-CM | POA: Diagnosis not present

## 2023-04-21 DIAGNOSIS — G4489 Other headache syndrome: Secondary | ICD-10-CM

## 2023-04-21 MED ORDER — ELETRIPTAN HYDROBROMIDE 40 MG PO TABS: 40.0000 mg | ORAL_TABLET | ORAL | 3 refills | Status: DC | PRN

## 2023-04-21 MED ORDER — NURTEC 75 MG PO TBDP: 75.0000 mg | ORAL_TABLET | ORAL | 11 refills | Status: AC

## 2023-04-21 NOTE — Progress Notes (Signed)
 Pt here to F/U on HA's .  Pt notes no improvement with HA's.   History:  Peggy Kelly is a 33 y.o. G2P1001 who presents to clinic today for headache followup.  She had SAB in November (after conceiving via IVF) and has struggled with hormonal changes since that time.  She had undergone IVF to get to that point.   Last visit with me was August 24 and she was given sumatriptan to try in place of rizatriptan - neither are particularly helpful at this time.  Fioricet did not help.  She got ubrelvy from urgent care but didn't really make it better.  Excedrin helps if the headache is mild. During her cycle the headache is at its worst, lasting several days.  She has tried topamax for prevention. She is taking metoprolol 25mg  - just recently restarted this for migraine prevention.  She may consider IVF again.  She has 2 remaining embryos.     Past Medical History:  Diagnosis Date   Allergy    Anemia    Anxiety    Cesarean delivery delivered 08/20/2017   Migraines    Missed abortion 12/15/2022   Seasonal allergies    Vaginal Pap smear, abnormal     Social History   Socioeconomic History   Marital status: Married    Spouse name: Nurse, adult   Number of children: 0   Years of education: 16   Highest education level: Not on file  Occupational History    Comment: Advertising copywriter- data specialist  Tobacco Use   Smoking status: Never   Smokeless tobacco: Never  Vaping Use   Vaping status: Never Used  Substance and Sexual Activity   Alcohol use: Not Currently    Comment: occassional, 09/23/15 none now   Drug use: No   Sexual activity: Not Currently  Other Topics Concern   Not on file  Social History Narrative   ** Merged History Encounter **    Lives at home with husband, has 1 child, 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  Christian.  Exercise 3 days per week.    12/2021.   Social Drivers of Research scientist (physical sciences) Strain: Not on file  Food Insecurity: No Food Insecurity (02/08/2023)   Hunger Vital Sign    Worried About Running Out of Food in the Last Year: Never true    Ran Out of Food in the Last Year: Never true  Transportation Needs: No Transportation Needs (02/08/2023)   PRAPARE - Administrator, Civil Service (Medical): No    Lack of Transportation (Non-Medical): No  Physical Activity: Not on file  Stress: Not on file  Social Connections: Not on file  Intimate Partner Violence: Not on file    Family History  Problem Relation Age of Onset   Thyroid disease Mother    Migraines Sister    Other Sister        anti NMDA receptor encephalitis   Thyroid disease Maternal Grandmother    Cancer Maternal Grandfather        prostate    Allergies  Allergen Reactions   Penicillins Hives    Patient doesn't remember what type of reaction. Has patient had a PCN reaction causing immediate rash, facial/tongue/throat swelling, SOB or lightheadedness with hypotension: No Has patient had a PCN reaction causing severe rash involving mucus membranes or skin necrosis: No Has patient had a PCN reaction that required hospitalization: No  Has patient had a PCN reaction occurring within the last 10 years: No If all of the above answers are "NO", then may proceed with Cephalosporin use.     Current Outpatient Medications on File Prior to Visit  Medication Sig Dispense Refill   atomoxetine (STRATTERA) 25 MG capsule Take 1 capsule (25 mg total) by mouth daily. 30 capsule 6   Butalbital-APAP-Caffeine 50-325-40 MG capsule Take 1-2 capsules by mouth every 6 (six) hours as needed for headache. 30 capsule 1   Cholecalciferol (VITAMIN D3 PO)      clobetasol (TEMOVATE) 0.05 % external solution Apply twice daily to affected areas on scalp as needed for itching/irritation. Avoid applying to face, groin, and axilla. 50 mL 2   clobetasol cream (TEMOVATE) 0.05 % Apply twice a day to affected areas on body  up to 2 weeks as needed for eczema. Avoid applying to face, groin, and axilla. 45 g 2   diazepam (VALIUM) 5 MG tablet TAKE 1 TO 2 TABLETS BY MOUTH AT BEDTIME AND 1-2 HOURS BEFORE DENTAL APPOINTMENT     doxycycline (VIBRA-TABS) 100 MG tablet Take 1 tablet by mouth 2 (two) times daily.     enoxaparin (LOVENOX) 30 MG/0.3ML injection INJECT 1 PREFILLED SYRINGE SUBCUTANEOUS ONCE DAILY AS DIRECTED     estradiol (ESTRACE) 2 MG tablet Take 1 tablet by mouth 3 (three) times daily.     fluocinolone (SYNALAR) 0.01 % external solution Apply topically daily. (Patient taking differently: Apply 1 Application topically daily as needed (scalp irritation.).) 60 mL 1   Fluocinolone Acetonide Scalp (DERMA-SMOOTHE/FS SCALP) 0.01 % OIL Apply to affected areas on scalp at least 2 hours prior to shampooing. 120 mL 3   ibuprofen (ADVIL) 600 MG tablet Take 1 tablet (600 mg total) by mouth every 6 (six) hours as needed for headache, mild pain (pain score 1-3), moderate pain (pain score 4-6) or cramping. (Patient not taking: Reported on 02/08/2023) 30 tablet 2   ketoconazole (NIZORAL) 2 % shampoo Apply topically 2 (two) times a week. (Patient taking differently: Apply 1 Application topically daily as needed (scalp irritation.).) 120 mL 1   magnesium oxide (MAG-OX) 400 (240 Mg) MG tablet Take 0.5 tablets (200 mg total) by mouth at bedtime. 60 tablet 3   meloxicam (MOBIC) 7.5 MG tablet Take 2 tablets every day by oral route with meal(s) for 14 days.     predniSONE (DELTASONE) 5 MG tablet Take 1 tablet by mouth 2 (two) times daily.     progesterone (PROMETRIUM) 200 MG capsule INSERT 1 CAPSULE VAGINALLY TWICE DAILY.     progesterone 50 MG/ML injection INJECT INTO THE MUSCLE ONCE DAILY     rizatriptan (MAXALT) 10 MG tablet Take 1 tablet (10 mg total) by mouth as needed for migraine. May repeat in 2 hours if needed 10 tablet 0   sertraline (ZOLOFT) 50 MG tablet TAKE 1 TABLET BY MOUTH DAILY FOR ANXIETY     SUMAtriptan (IMITREX) 100  MG tablet Take 1 tablet (100 mg total) by mouth once as needed for up to 1 dose for migraine. May repeat in 2 hours if headache persists or recurs. (Patient not taking: Reported on 02/08/2023) 9 tablet 11   tacrolimus (PROGRAF) 1 MG capsule TAKE ONE CAPSULE BY MOUTH THREE TIMES DAILY AS DIRECTED.     traZODone (DESYREL) 50 MG tablet TAKE 1 TABLET(50 MG) BY MOUTH TWICE DAILY AS NEEDED FOR SLEEP 30 tablet 4   UBRELVY 100 MG TABS Take by mouth.  No current facility-administered medications on file prior to visit.     Review of Systems:  All pertinent positive/negative included in HPI, all other review of systems are negative   Objective:  Physical Exam BP 119/81   Pulse 82   Wt 207 lb (93.9 kg)   BMI 35.53 kg/m  CONSTITUTIONAL: Well-developed, well-nourished female in no acute distress.  EYES: EOM intact ENT: Normocephalic CARDIOVASCULAR: Regular rate RESPIRATORY: Normal rate.  MUSCULOSKELETAL: Normal ROM SKIN: Warm, dry without erythema  NEUROLOGICAL: Alert, oriented, CN II-XII grossly intact, Appropriate balance PSYCH: Normal behavior, mood   Assessment & Plan:  Assessment:  1. Migraine without aura and without status migrainosus, not intractable   2. Headache associated with hormonal factors       Plan: Use Nurtec every other day - particularly leading up to and during menses when migraine is likely to be its worst.  Samples of nurtec provided Sample of Zavzpret also provided.  Pt may trial in lieu of nurtec at her preference.   Will trial eletriptan in place of other triptans in hopes of better efficacy.   Will hold on additional prevention as she just started metoprolol AND nurtec may be good prevention.  Follow-up in 1-2 months or sooner PRN  47 minutes spent face to face with patient this encounter  Bertram Denver, PA-C 04/21/2023 9:59 AM

## 2023-05-04 ENCOUNTER — Encounter: Payer: Self-pay | Admitting: *Deleted

## 2023-05-10 DIAGNOSIS — F411 Generalized anxiety disorder: Secondary | ICD-10-CM | POA: Diagnosis not present

## 2023-05-17 DIAGNOSIS — F411 Generalized anxiety disorder: Secondary | ICD-10-CM | POA: Diagnosis not present

## 2023-05-24 DIAGNOSIS — F411 Generalized anxiety disorder: Secondary | ICD-10-CM | POA: Diagnosis not present

## 2023-05-26 ENCOUNTER — Encounter: Payer: Self-pay | Admitting: Oncology

## 2023-05-31 ENCOUNTER — Other Ambulatory Visit: Payer: Self-pay | Admitting: Certified Nurse Midwife

## 2023-05-31 DIAGNOSIS — J989 Respiratory disorder, unspecified: Secondary | ICD-10-CM

## 2023-05-31 DIAGNOSIS — J069 Acute upper respiratory infection, unspecified: Secondary | ICD-10-CM

## 2023-05-31 MED ORDER — PROMETHAZINE-DM 6.25-15 MG/5ML PO SYRP: 5.0000 mL | ORAL_SOLUTION | Freq: Three times a day (TID) | ORAL | 0 refills | Status: AC | PRN

## 2023-05-31 MED ORDER — PULMICORT FLEXHALER 90 MCG/ACT IN AEPB: 1.0000 | INHALATION_SPRAY | Freq: Two times a day (BID) | RESPIRATORY_TRACT | 3 refills | Status: DC

## 2023-05-31 MED ORDER — ALBUTEROL SULFATE HFA 108 (90 BASE) MCG/ACT IN AERS: 2.0000 | INHALATION_SPRAY | Freq: Four times a day (QID) | RESPIRATORY_TRACT | 2 refills | Status: AC | PRN

## 2023-05-31 MED ORDER — ALBUTEROL SULFATE HFA 108 (90 BASE) MCG/ACT IN AERS: 2.0000 | INHALATION_SPRAY | Freq: Four times a day (QID) | RESPIRATORY_TRACT | 2 refills | Status: DC | PRN

## 2023-05-31 MED ORDER — AZITHROMYCIN 250 MG PO TABS: ORAL_TABLET | ORAL | 0 refills | Status: DC

## 2023-05-31 NOTE — Addendum Note (Signed)
 Addended by: Lemuel Quaker on: 05/31/2023 03:45 PM   Modules accepted: Orders

## 2023-05-31 NOTE — Progress Notes (Signed)
 Pt has been battling severe allergies for several weeks, taking nasacort  and xyzal daily. It was working until the last couple of days when things got much worse. Is having increased congestion today with a coarse cough and corresponding "fiery" chest pain that is triggered by talking or too much movement. Worsened throughout the day, consistent with other times she's developed bacterial URIs when battling seasonal allergies. Will send in cough meds and antibiotics.  Lemuel Quaker, CNM, MSN, IBCLC Certified Nurse Midwife, [facility]

## 2023-06-01 ENCOUNTER — Encounter: Payer: Self-pay | Admitting: Oncology

## 2023-06-02 ENCOUNTER — Ambulatory Visit: Admitting: Physician Assistant

## 2023-06-02 ENCOUNTER — Encounter: Payer: Self-pay | Admitting: Physician Assistant

## 2023-06-02 VITALS — BP 130/81 | HR 86

## 2023-06-02 DIAGNOSIS — G43009 Migraine without aura, not intractable, without status migrainosus: Secondary | ICD-10-CM

## 2023-06-02 DIAGNOSIS — F411 Generalized anxiety disorder: Secondary | ICD-10-CM | POA: Diagnosis not present

## 2023-06-02 NOTE — Progress Notes (Signed)
 Nurtec is helpful! History:  Peggy Kelly is a 33 y.o. G2P1001 who presents to clinic today for headache followup.  She finally got relief.  The nurtec has been a great fit for her.  It works fast and it lasts the full 2 days.  She has found that she can use it regularly around menses and then at other times she only takes it when she needs it.  Most of the time, this is all she needs.  However she likes having the rizatriptan  available just in case.  It does help with headache but requires a nap.     Past Medical History:  Diagnosis Date   Allergy    Anemia    Anxiety    Cesarean delivery delivered 08/20/2017   Migraines    Missed abortion 12/15/2022   Seasonal allergies    Vaginal Pap smear, abnormal     Social History   Socioeconomic History   Marital status: Married    Spouse name: Jayvon   Number of children: 0   Years of education: 16   Highest education level: Not on file  Occupational History    Comment: Advertising copywriter- data specialist  Tobacco Use   Smoking status: Never   Smokeless tobacco: Never  Vaping Use   Vaping status: Never Used  Substance and Sexual Activity   Alcohol use: Not Currently    Comment: occassional, 09/23/15 none now   Drug use: No   Sexual activity: Not Currently  Other Topics Concern   Not on file  Social History Narrative   ** Merged History Encounter **    Lives at home with husband, has 1 child, 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  Christian.  Exercise 3 days per week.    12/2021.   Social Drivers of Corporate investment banker Strain: Not on file  Food Insecurity: No Food Insecurity (02/08/2023)   Hunger Vital Sign    Worried About Running Out of Food in the Last Year: Never true    Ran Out of Food in the Last Year: Never true  Transportation Needs: No Transportation Needs (02/08/2023)   PRAPARE - Administrator, Civil Service (Medical): No    Lack  of Transportation (Non-Medical): No  Physical Activity: Not on file  Stress: Not on file  Social Connections: Not on file  Intimate Partner Violence: Not on file    Family History  Problem Relation Age of Onset   Thyroid  disease Mother    Migraines Sister    Other Sister        anti NMDA receptor encephalitis   Thyroid  disease Maternal Grandmother    Cancer Maternal Grandfather        prostate    Allergies  Allergen Reactions   Penicillins Hives    Patient doesn't remember what type of reaction. Has patient had a PCN reaction causing immediate rash, facial/tongue/throat swelling, SOB or lightheadedness with hypotension: No Has patient had a PCN reaction causing severe rash involving mucus membranes or skin necrosis: No Has patient had a PCN reaction that required hospitalization: No Has patient had a PCN reaction occurring within the last 10 years: No If all of the above answers are "NO", then may proceed with Cephalosporin use.     Current Outpatient Medications on File Prior to Visit  Medication Sig Dispense Refill   albuterol  (VENTOLIN  HFA) 108 (90 Base) MCG/ACT inhaler Inhale 2 puffs into  the lungs every 6 (six) hours as needed for wheezing or shortness of breath. 8 g 2   atomoxetine  (STRATTERA ) 25 MG capsule Take 1 capsule (25 mg total) by mouth daily. 30 capsule 6   azithromycin  (ZITHROMAX ) 250 MG tablet Take two tablets on the first day, then one tablet daily for four days. 6 each 0   Cholecalciferol (VITAMIN D3 PO)      clobetasol  (TEMOVATE ) 0.05 % external solution Apply twice daily to affected areas on scalp as needed for itching/irritation. Avoid applying to face, groin, and axilla. 50 mL 2   clobetasol  cream (TEMOVATE ) 0.05 % Apply twice a day to affected areas on body up to 2 weeks as needed for eczema. Avoid applying to face, groin, and axilla. 45 g 2   metoprolol  succinate (TOPROL -XL) 25 MG 24 hr tablet Take 25 mg by mouth daily.     promethazine -dextromethorphan  (PROMETHAZINE -DM) 6.25-15 MG/5ML syrup Take 5 mLs by mouth 3 (three) times daily as needed for cough. 118 mL 0   PULMICORT  FLEXHALER 90 MCG/ACT inhaler INHALE 1-2 PUFFS INTO THE LUNGS 2 (TWO) TIMES DAILY. 1 each 3   Rimegepant Sulfate (NURTEC) 75 MG TBDP Take 1 tablet (75 mg total) by mouth every other day. 16 tablet 11   rizatriptan  (MAXALT ) 10 MG tablet Take 1 tablet (10 mg total) by mouth as needed for migraine. May repeat in 2 hours if needed 10 tablet 0   traZODone  (DESYREL ) 50 MG tablet TAKE 1 TABLET(50 MG) BY MOUTH TWICE DAILY AS NEEDED FOR SLEEP 30 tablet 4   Butalbital -APAP-Caffeine  50-325-40 MG capsule Take 1-2 capsules by mouth every 6 (six) hours as needed for headache. (Patient not taking: Reported on 06/02/2023) 30 capsule 1   fluocinolone  (SYNALAR ) 0.01 % external solution Apply topically daily. (Patient taking differently: Apply 1 Application topically daily as needed (scalp irritation.).) 60 mL 1   Fluocinolone  Acetonide Scalp (DERMA-SMOOTHE /FS SCALP) 0.01 % OIL Apply to affected areas on scalp at least 2 hours prior to shampooing. 120 mL 3   ketoconazole  (NIZORAL ) 2 % shampoo Apply topically 2 (two) times a week. (Patient taking differently: Apply 1 Application topically daily as needed (scalp irritation.).) 120 mL 1   magnesium oxide (MAG-OX) 400 (240 Mg) MG tablet Take 0.5 tablets (200 mg total) by mouth at bedtime. 60 tablet 3   meloxicam (MOBIC) 7.5 MG tablet Take 2 tablets every day by oral route with meal(s) for 14 days.     UBRELVY 100 MG TABS Take by mouth.     No current facility-administered medications on file prior to visit.     Review of Systems:  All pertinent positive/negative included in HPI, all other review of systems are negative   Objective:  Physical Exam BP 130/81   Pulse 86  CONSTITUTIONAL: Well-developed, well-nourished female in no acute distress.  EYES: EOM intact ENT: Normocephalic CARDIOVASCULAR: Regular rate  RESPIRATORY: Normal rate.   MUSCULOSKELETAL: Normal ROM SKIN: Warm, dry without erythema  NEUROLOGICAL: Alert, oriented, CN II-XII grossly intact, Appropriate balance PSYCH: Normal behavior, mood   Assessment & Plan:  Assessment: 1. Migraine without aura and without status migrainosus, not intractable      Plan: Continue with nurtec every other day/as needed She still has rizatriptan  and likes to have it available in case a headache breaks through She is also using Happy Hippie Hormone and notes it has helped her mood. Follow-up in 12  months or sooner PRN  21 minutes spent face to face with patient  this encounter.  Jeanett Miles, PA-C 06/02/2023 11:05 AM

## 2023-06-02 NOTE — Patient Instructions (Signed)
 Migraine Headache A migraine headache is a very strong throbbing pain on one or both sides of your head. This type of headache can also cause other symptoms. It can last from 4 hours to 3 days. Talk with your doctor about what things may bring on (trigger) this condition. What are the causes? The exact cause of a migraine is not known. This condition may be brought on or caused by: Smoking. Medicines, such as: Medicine used to treat chest pain (nitroglycerin). Birth control pills. Estrogen. Some blood pressure medicines. Certain substances in some foods or drinks. Foods and drinks, such as: Cheese. Chocolate. Alcohol. Caffeine. Doing physical activity that is very hard. Other things that may trigger a migraine headache include: Periods. Pregnancy. Hunger. Stress. Getting too much or too little sleep. Weather changes. Feeling tired (fatigue). What increases the risk? Being 18-65 years old. Being female. Having a family history of migraine headaches. Being Caucasian. Having a mental health condition, such as being sad (depressed) or feeling worried or nervous (anxious). Being very overweight (obese). What are the signs or symptoms? A throbbing pain. This pain may: Happen in any area of the head, such as on one or both sides. Make it hard to do daily activities. Get worse with physical activity. Get worse around bright lights, loud noises, or smells. Other symptoms may include: Feeling like you may vomit (nauseous). Vomiting. Dizziness. Before a migraine headache starts, you may get warning signs (an aura). An aura may include: Seeing flashing lights or having blind spots. Seeing bright spots, halos, or zigzag lines. Having tunnel vision or blurred vision. Having numbness or a tingling feeling. Having trouble talking. Having weak muscles. After a migraine ends, you may have symptoms. These may include: Tiredness. Trouble thinking (concentrating). How is this  treated? Taking medicines that: Relieve pain. Relieve the feeling like you may vomit. Prevent migraine headaches. Treatment may also include: Acupuncture. Lifestyle changes like avoiding foods that bring on migraine headaches. Learning ways to control your body functions (biofeedback). Therapy to help you know and deal with negative thoughts (cognitive behavioral therapy). Follow these instructions at home: Medicines Take over-the-counter and prescription medicines only as told by your doctor. If told, take steps to prevent problems with pooping (constipation). You may need to: Drink enough fluid to keep your pee (urine) pale yellow. Take medicines. You will be told what medicines to take. Eat foods that are high in fiber. These include beans, whole grains, and fresh fruits and vegetables. Limit foods that are high in fat and sugar. These include fried or sweet foods. Ask your doctor if you should avoid driving or using machines while you are taking your medicine. Lifestyle  Do not drink alcohol. Do not smoke or use any products that contain nicotine or tobacco. If you need help quitting, ask your doctor. Get 7-9 hours of sleep each night, or the amount recommended by your doctor. Find ways to deal with stress, such as meditation, deep breathing, or yoga. Try to exercise often. This can help lessen how bad and how often your migraines happen. General instructions Keep a journal to find out what may bring on your migraine headaches. This can help you avoid those things. For example, write down: What you eat and drink. How much sleep you get. Any change to your medicines or diet. If you have a migraine headache: Avoid things that make your symptoms worse, such as bright lights. Lie down in a dark, quiet room. Do not drive or use machinery. Ask your  doctor what activities are safe for you. Where to find more information Coalition for Headache and Migraine Patients (CHAMP):  headachemigraine.org American Migraine Foundation: americanmigrainefoundation.org National Headache Foundation: headaches.org Contact a doctor if: You get a migraine headache that is different or worse than others you have had. You have more than 15 days of headaches in one month. Get help right away if: Your migraine headache gets very bad. Your migraine headache lasts more than 72 hours. You have a fever or stiff neck. You have trouble seeing. Your muscles feel weak or like you cannot control them. You lose your balance a lot. You have trouble walking. You faint. You have a seizure. This information is not intended to replace advice given to you by your health care provider. Make sure you discuss any questions you have with your health care provider. Document Revised: 09/06/2021 Document Reviewed: 09/06/2021 Elsevier Patient Education  2024 [employer].

## 2023-06-02 NOTE — Progress Notes (Signed)
 Medication Samples have been provided to the patient.  Drug name: Nurtec        Strength: 75mcg        Qty: 2     LOT: [id number]  Exp.Date: 05/2024  Dosing instructions: As Directed   The patient has been instructed regarding the correct time, dose, and frequency of taking this medication, including desired effects and most common side effects.   Shannan Dart 11:31 AM 06/02/2023

## 2023-06-05 ENCOUNTER — Telehealth: Payer: Self-pay | Admitting: Lactation Services

## 2023-06-05 NOTE — Telephone Encounter (Signed)
 Unable to submit PA for Pulmicort  through [employer]. Called [employer] at [phone] and spoke with [place] PA started via phone. It will take 24-72 hours for determination, it will be faxed back once reviewed.

## 2023-06-07 DIAGNOSIS — F411 Generalized anxiety disorder: Secondary | ICD-10-CM | POA: Diagnosis not present

## 2023-06-07 NOTE — Telephone Encounter (Signed)
 PA for Pulmicort  denied. PA informed that patient needs to try Arnuity Ellipta before can use Pulmicort . Will route to Peggy Kelly, CNM for advisement.

## 2023-06-12 ENCOUNTER — Other Ambulatory Visit: Payer: Self-pay | Admitting: Certified Nurse Midwife

## 2023-06-12 MED ORDER — FLUTICASONE PROPIONATE HFA 110 MCG/ACT IN AERO: 2.0000 | INHALATION_SPRAY | Freq: Every day | RESPIRATORY_TRACT | 12 refills | Status: DC

## 2023-06-14 DIAGNOSIS — F411 Generalized anxiety disorder: Secondary | ICD-10-CM | POA: Diagnosis not present

## 2023-06-21 ENCOUNTER — Other Ambulatory Visit: Payer: Self-pay

## 2023-06-21 ENCOUNTER — Encounter: Payer: Self-pay | Admitting: Certified Nurse Midwife

## 2023-06-21 ENCOUNTER — Encounter: Payer: Self-pay | Admitting: Oncology

## 2023-06-21 ENCOUNTER — Ambulatory Visit: Admitting: Certified Nurse Midwife

## 2023-06-21 VITALS — BP 128/89 | HR 75 | Ht 64.0 in | Wt 204.0 lb

## 2023-06-21 DIAGNOSIS — F419 Anxiety disorder, unspecified: Secondary | ICD-10-CM

## 2023-06-21 DIAGNOSIS — Z01419 Encounter for gynecological examination (general) (routine) without abnormal findings: Secondary | ICD-10-CM

## 2023-06-21 DIAGNOSIS — Z6835 Body mass index (BMI) 35.0-35.9, adult: Secondary | ICD-10-CM | POA: Diagnosis not present

## 2023-06-21 DIAGNOSIS — F411 Generalized anxiety disorder: Secondary | ICD-10-CM | POA: Diagnosis not present

## 2023-06-21 MED ORDER — ATOMOXETINE HCL 40 MG PO CAPS
40.0000 mg | ORAL_CAPSULE | Freq: Every day | ORAL | 5 refills | Status: AC
  Filled 2023-07-24 (×2): qty 30, 30d supply, fill #0
  Filled 2023-08-25: qty 30, 30d supply, fill #1

## 2023-06-21 NOTE — Progress Notes (Signed)
 ANNUAL EXAM Patient name: Peggy Kelly MRN [id number]  Date of birth: 1991/01/19 Chief Complaint:   Gynecologic Exam  History of Present Illness:   Peggy Kelly is a 33 y.o. G68P1001 African-American female being seen today for a routine annual exam.  Current complaints: Doing well physically but wants to discuss weight loss and an increase in her anxiety related to her new job and June being the month she would've delivered (but miscarried in November).  Patient's last menstrual period was 06/21/2023 (approximate).   Upstream - 06/29/23 1148       Pregnancy Intention Screening   Does the patient want to become pregnant in the next year? Unsure    Does the patient's partner want to become pregnant in the next year? Unsure    Would the patient like to discuss contraceptive options today? N/A      Contraception Wrap Up   Current Method No Method - Other Reason    Reason for No Current Contraceptive Method at Intake ([facility] Only) Other   Husband has female infertility   Contraception Counseling Provided No    How was the end contraceptive method provided? N/A            Last pap 01/05/22. Results were: NILM w/ HRHPV negative. H/O abnormal pap: no Last mammogram: Never (age). Results were: N/A. Family h/o breast cancer: no Last colonoscopy: Never (age). Results were: N/A. Family h/o colorectal cancer: no     02/08/2023   10:48 AM 03/30/2022    2:30 PM 01/11/2022   12:12 PM 03/22/2018   10:59 AM 06/06/2016    3:20 PM  Depression screen PHQ 2/9  Decreased Interest 1 0 0 0 0  Down, Depressed, Hopeless 1 0 0 0 0  PHQ - 2 Score 2 0 0 0 0  Altered sleeping 1 0     Tired, decreased energy 1 1     Change in appetite 1 0     Feeling bad or failure about yourself  1 0     Trouble concentrating 0 1     Moving slowly or fidgety/restless 0 0     Suicidal thoughts 0 0     PHQ-9 Score 6 2           02/08/2023   10:49 AM 03/30/2022    2:31 PM  GAD 7 : Generalized Anxiety Score   Nervous, Anxious, on Edge 1 1  Control/stop worrying 1 0  Worry too much - different things 1 0  Trouble relaxing 0 0  Restless 0 0  Easily annoyed or irritable 1 0  Afraid - awful might happen -- 0  Total GAD 7 Score  1     Review of Systems:   Pertinent items are noted in HPI Denies any headaches, blurred vision, fatigue, shortness of breath, chest pain, abdominal pain, abnormal vaginal discharge/itching/odor/irritation, problems with periods, bowel movements, urination, or intercourse unless otherwise stated above.  Pertinent History Reviewed:  Reviewed past medical,surgical, social and family history.  Reviewed problem list, medications and allergies.  Physical Assessment:   Vitals:   06/21/23 1651  BP: 128/89  Pulse: 75  Weight: 204 lb (92.5 kg)  Height: 5\' 4"  (1.626 m)   Body mass index is 35.02 kg/m.   Physical Examination:  General appearance - well appearing, and in no distress Mental status - alert, oriented to person, place, and time Psych:  She has a normal mood and affect Skin - warm and dry, normal  color, no suspicious lesions noted Chest - effort normal, no problems with respiration noted Heart - normal rate and regular rhythm Neck:  midline trachea, no thyromegaly  Pelvic - deferred Extremities:  No swelling or varicosities noted  Chaperone present for exam  No results found for this or any previous visit (from the past 24 hours).  Assessment & Plan:  1. Encounter for annual routine gynecological examination (Primary) - Routine preventative health maintenance measures emphasized. Mammogram: @ 33yo, or sooner if problems Colonoscopy: @ 33yo, or sooner if problems  2. Anxiety - Discussed lifestyle changes to support her mental health as she transitions, and grieves.  - Sees a therapist regularly and has good family support. - atomoxetine  (STRATTERA ) 40 MG capsule; Take 1 capsule (40 mg total) by mouth daily.  Dispense: 30 capsule; Refill: 6  3.  BMI 35.0-35.9 - Discussed use of phentermine for weight loss, which would need to be prescribed by a weight loss specialist (or other PCP) - Reviewed dietary and lifestyle changes that provide sustainable weight loss (albeit slower) such as making sure to eat regular protein and fiber rich meals, not skipping meals, etc.  - Pt amenable to trying these changes prior to weight loss meds, will also refer to weight management for dietician support.  Meds:  Meds ordered this encounter  Medications   atomoxetine  (STRATTERA ) 40 MG capsule    Sig: Take 1 capsule (40 mg total) by mouth daily.    Dispense:  30 capsule    Refill:  6   Follow-up: Return if symptoms worsen or fail to improve, for ANN.  Lemuel Quaker, CNM, MSN, IBCLC Certified Nurse Midwife, [facility]

## 2023-06-23 DIAGNOSIS — F411 Generalized anxiety disorder: Secondary | ICD-10-CM | POA: Diagnosis not present

## 2023-06-28 ENCOUNTER — Other Ambulatory Visit: Payer: Self-pay | Admitting: Certified Nurse Midwife

## 2023-06-28 DIAGNOSIS — F411 Generalized anxiety disorder: Secondary | ICD-10-CM | POA: Diagnosis not present

## 2023-06-28 DIAGNOSIS — Z8669 Personal history of other diseases of the nervous system and sense organs: Secondary | ICD-10-CM

## 2023-06-30 DIAGNOSIS — F411 Generalized anxiety disorder: Secondary | ICD-10-CM | POA: Diagnosis not present

## 2023-07-03 DIAGNOSIS — F331 Major depressive disorder, recurrent, moderate: Secondary | ICD-10-CM | POA: Diagnosis not present

## 2023-07-07 DIAGNOSIS — F331 Major depressive disorder, recurrent, moderate: Secondary | ICD-10-CM | POA: Diagnosis not present

## 2023-07-13 DIAGNOSIS — F331 Major depressive disorder, recurrent, moderate: Secondary | ICD-10-CM | POA: Diagnosis not present

## 2023-07-14 DIAGNOSIS — F331 Major depressive disorder, recurrent, moderate: Secondary | ICD-10-CM | POA: Diagnosis not present

## 2023-07-19 DIAGNOSIS — F331 Major depressive disorder, recurrent, moderate: Secondary | ICD-10-CM | POA: Diagnosis not present

## 2023-07-21 DIAGNOSIS — F331 Major depressive disorder, recurrent, moderate: Secondary | ICD-10-CM | POA: Diagnosis not present

## 2023-07-24 ENCOUNTER — Other Ambulatory Visit: Payer: Self-pay

## 2023-07-24 ENCOUNTER — Other Ambulatory Visit (HOSPITAL_COMMUNITY): Payer: Self-pay

## 2023-07-24 ENCOUNTER — Encounter: Payer: Self-pay | Admitting: Oncology

## 2023-07-24 ENCOUNTER — Encounter (INDEPENDENT_AMBULATORY_CARE_PROVIDER_SITE_OTHER): Payer: Self-pay

## 2023-07-25 ENCOUNTER — Other Ambulatory Visit: Payer: Self-pay

## 2023-07-25 ENCOUNTER — Ambulatory Visit: Admitting: Bariatrics

## 2023-07-25 ENCOUNTER — Encounter: Payer: Self-pay | Admitting: Bariatrics

## 2023-07-25 VITALS — BP 130/86 | HR 76 | Temp 97.8°F | Ht 64.0 in | Wt 204.0 lb

## 2023-07-25 DIAGNOSIS — E669 Obesity, unspecified: Secondary | ICD-10-CM

## 2023-07-25 DIAGNOSIS — G43009 Migraine without aura, not intractable, without status migrainosus: Secondary | ICD-10-CM

## 2023-07-25 DIAGNOSIS — F331 Major depressive disorder, recurrent, moderate: Secondary | ICD-10-CM | POA: Diagnosis not present

## 2023-07-25 DIAGNOSIS — Z6835 Body mass index (BMI) 35.0-35.9, adult: Secondary | ICD-10-CM | POA: Diagnosis not present

## 2023-07-25 DIAGNOSIS — E6609 Other obesity due to excess calories: Secondary | ICD-10-CM

## 2023-07-25 DIAGNOSIS — Z0289 Encounter for other administrative examinations: Secondary | ICD-10-CM

## 2023-07-25 NOTE — Progress Notes (Signed)
 Office: [phone]  /  Fax: [phone]   Consult for care:   Peggy Kelly was seen in clinic today to evaluate for obesity. She is interested in losing weight to improve overall health and reduce the risk of weight related complications. She presents today to review program treatment options, initial physical assessment, and evaluation.     She was referred by: PCP  When asked what else they would like to accomplish? She states: Adopt a healthier eating pattern and lifestyle and Improve quality of life  When asked how has your weight affected you? She states: Other: none  Some associated conditions: Other: Migraine  Contributing factors: family history of obesity  Weight promoting medications identified: Other: fertility medications.   Current nutrition plan: Low-carb, High-protein, Portion control / smart choices, and Journaling  Current level of physical activity: None and Other: walking, and working out  Current or previous pharmacotherapy: None and Is interested in pharmacotherapy  Response to medication: Never tried medications   Past medical history includes:   Past Medical History:  Diagnosis Date   Allergy    Anemia    Anxiety    Cesarean delivery delivered 08/20/2017   Migraines    Missed abortion 12/15/2022   Seasonal allergies    Vaginal Pap smear, abnormal      Objective:   BP 130/86   Pulse 76   Temp 97.8 F (36.6 C)   Ht 5' 4 (1.626 m)   Wt 204 lb (92.5 kg)   SpO2 100%   BMI 35.02 kg/m  She was weighed on the bioimpedance scale: Body mass index is 35.02 kg/m.  Peak Weight:204 lbs  , Body Fat%:43.3 %, Visceral Fat Rating:9, Weight trend over the last 12 months: Increasing  General:  Alert, oriented and cooperative. Patient is in no acute distress.  Respiratory: Normal respiratory effort, no problems with respiration noted  Extremities: Normal range of motion.    Mental Status: Normal mood and affect. Normal behavior. Normal judgment  and thought content.   DIAGNOSTIC DATA REVIEWED:  BMET    Component Value Date/Time   NA 137 12/22/2022 2128   NA 141 07/18/2022 1133   NA 141 02/25/2014 1042   K 3.7 12/22/2022 2128   K 3.6 02/25/2014 1042   CL 106 12/22/2022 2128   CO2 25 12/22/2022 2128   CO2 20 (L) 02/25/2014 1042   GLUCOSE 97 12/22/2022 2128   GLUCOSE 79 02/25/2014 1042   BUN 8 12/22/2022 2128   BUN 7 07/18/2022 1133   BUN 10.3 02/25/2014 1042   CREATININE 0.65 12/22/2022 2128   CREATININE 0.8 02/25/2014 1042   CALCIUM 8.9 12/22/2022 2128   CALCIUM 8.3 (L) 02/25/2014 1042   GFRNONAA >60 12/22/2022 2128   GFRAA >60 08/20/2017 0850   Lab Results  Component Value Date   HGBA1C 5.8 (H) 07/18/2022   No results found for: INSULIN CBC    Component Value Date/Time   WBC 5.0 02/08/2023 1125   WBC 7.1 12/22/2022 2128   RBC 3.89 02/08/2023 1125   RBC 3.61 (L) 12/22/2022 2128   HGB 10.1 (L) 02/08/2023 1125   HGB 11.2 (L) 09/23/2015 1353   HCT 32.3 (L) 02/08/2023 1125   HCT 35.2 09/23/2015 1353   PLT 384 02/08/2023 1125   MCV 83 02/08/2023 1125   MCV 81.8 09/23/2015 1353   MCH 26.0 (L) 02/08/2023 1125   MCH 26.9 12/22/2022 2128   MCHC 31.3 (L) 02/08/2023 1125   MCHC 31.1 12/22/2022 2128  RDW 12.7 02/08/2023 1125   RDW 14.8 (H) 09/23/2015 1353   Iron /TIBC/Ferritin/ %Sat    Component Value Date/Time   IRON  45 01/11/2022 1300   IRON  39 (L) 09/23/2015 1353   TIBC 348 01/11/2022 1300   TIBC 315 09/23/2015 1353   FERRITIN 9 (L) 01/11/2022 1300   FERRITIN 21 09/23/2015 1353   IRONPCTSAT 13 (L) 01/11/2022 1300   IRONPCTSAT 12 (L) 09/23/2015 1353   Lipid Panel     Component Value Date/Time   CHOL 151 01/11/2022 1300   TRIG 40 01/11/2022 1300   HDL 64 01/11/2022 1300   CHOLHDL 2.4 01/11/2022 1300   LDLCALC 78 01/11/2022 1300   Hepatic Function Panel     Component Value Date/Time   PROT 6.4 (L) 12/22/2022 2128   PROT 6.8 07/18/2022 1133   PROT 6.8 02/25/2014 1042   ALBUMIN 3.0 (L)  12/22/2022 2128   ALBUMIN 4.1 07/18/2022 1133   ALBUMIN 3.7 02/25/2014 1042   AST 16 12/22/2022 2128   AST 16 02/25/2014 1042   ALT 17 12/22/2022 2128   ALT 14 02/25/2014 1042   ALKPHOS 43 12/22/2022 2128   ALKPHOS 64 02/25/2014 1042   BILITOT 0.5 12/22/2022 2128   BILITOT <0.2 07/18/2022 1133   BILITOT 0.45 02/25/2014 1042      Component Value Date/Time   TSH 0.826 11/02/2022 0957     Assessment and Plan:   Migraine without aura:  She has a history of migraines without aura her migraines are currently well-controlled she is seeing a headache specialist.  She is currently taking Nurtec and Imitrex .   Plan: Will continue to follow-up with her headache specialist. Will begin program and start plan and exercise.    Generalized Obesity: Current BMI 35.02    Obesity Treatment / Action Plan:  Patient will work on garnering support from family and friends to begin weight loss journey. Will work on eliminating or reducing the presence of highly palatable, calorie dense foods in the home. Will complete provided nutritional and psychosocial assessment questionnaire before the next appointment. Will be scheduled for indirect calorimetry to determine resting energy expenditure in a fasting state.  This will allow us  to create a reduced calorie, high-protein meal plan to promote loss of fat mass while preserving muscle mass. Counseled on the health benefits of losing 5%-15% of total body weight. Was counseled on nutritional approaches to weight loss and benefits of reducing processed foods and consuming plant-based foods and high quality protein as part of nutritional weight management. Was counseled on pharmacotherapy and role as an adjunct in weight management.   Obesity Education Performed Today:  She was weighed on the bioimpedance scale and results were discussed and documented in the synopsis.  We discussed obesity as a disease and the importance of a more detailed evaluation of  all the factors contributing to the disease.  We discussed the importance of long term lifestyle changes which include nutrition, exercise and behavioral modifications as well as the importance of customizing this to her specific health and social needs.  We discussed the benefits of reaching a healthier weight to alleviate the symptoms of existing conditions and reduce the risks of the biomechanical, metabolic and psychological effects of obesity.  Discussed New Patient/Late Arrival, and Cancellation Policies. Patient voiced understanding and allowed to ask questions.   Rondell R Willison appears to be in the action stage of change and states they are ready to start intensive lifestyle modifications and behavioral modifications.  30 minutes was spent  today on this visit including the above counseling, pre-visit chart review, and post-visit documentation.  Reviewed by clinician on day of visit: allergies, medications, problem list, medical history, surgical history, family history, social history, and previous encounter notes.    Shuayb Schepers A. Delores CORDOBAO.

## 2023-07-27 ENCOUNTER — Other Ambulatory Visit: Payer: Self-pay | Admitting: Certified Nurse Midwife

## 2023-07-27 ENCOUNTER — Other Ambulatory Visit (HOSPITAL_COMMUNITY): Payer: Self-pay

## 2023-07-27 DIAGNOSIS — F331 Major depressive disorder, recurrent, moderate: Secondary | ICD-10-CM | POA: Diagnosis not present

## 2023-07-27 MED ORDER — METOPROLOL SUCCINATE ER 25 MG PO TB24
25.0000 mg | ORAL_TABLET | Freq: Every day | ORAL | 3 refills | Status: AC
  Filled 2023-07-27: qty 90, 90d supply, fill #0

## 2023-08-01 ENCOUNTER — Other Ambulatory Visit: Payer: Self-pay

## 2023-08-01 ENCOUNTER — Other Ambulatory Visit (HOSPITAL_COMMUNITY): Payer: Self-pay

## 2023-08-01 DIAGNOSIS — Z8669 Personal history of other diseases of the nervous system and sense organs: Secondary | ICD-10-CM

## 2023-08-01 MED ORDER — RIZATRIPTAN BENZOATE 10 MG PO TABS
10.0000 mg | ORAL_TABLET | ORAL | 0 refills | Status: DC | PRN
  Filled 2023-08-01: qty 10, 30d supply, fill #0

## 2023-08-01 NOTE — Telephone Encounter (Signed)
 Rizatriptan  refill requested by patient prior to traveling out of town.

## 2023-08-02 ENCOUNTER — Ambulatory Visit (INDEPENDENT_AMBULATORY_CARE_PROVIDER_SITE_OTHER): Admitting: Bariatrics

## 2023-08-02 ENCOUNTER — Encounter: Payer: Self-pay | Admitting: Bariatrics

## 2023-08-02 VITALS — BP 110/76 | HR 74 | Temp 97.8°F | Ht 64.0 in | Wt 202.0 lb

## 2023-08-02 DIAGNOSIS — E669 Obesity, unspecified: Secondary | ICD-10-CM | POA: Diagnosis not present

## 2023-08-02 DIAGNOSIS — R0602 Shortness of breath: Secondary | ICD-10-CM

## 2023-08-02 DIAGNOSIS — R5383 Other fatigue: Secondary | ICD-10-CM

## 2023-08-02 DIAGNOSIS — E559 Vitamin D deficiency, unspecified: Secondary | ICD-10-CM | POA: Diagnosis not present

## 2023-08-02 DIAGNOSIS — Z0000 Encounter for general adult medical examination without abnormal findings: Secondary | ICD-10-CM

## 2023-08-02 DIAGNOSIS — Z1331 Encounter for screening for depression: Secondary | ICD-10-CM | POA: Diagnosis not present

## 2023-08-02 DIAGNOSIS — D500 Iron deficiency anemia secondary to blood loss (chronic): Secondary | ICD-10-CM

## 2023-08-02 DIAGNOSIS — E6609 Other obesity due to excess calories: Secondary | ICD-10-CM

## 2023-08-02 DIAGNOSIS — D509 Iron deficiency anemia, unspecified: Secondary | ICD-10-CM | POA: Diagnosis not present

## 2023-08-02 DIAGNOSIS — G43009 Migraine without aura, not intractable, without status migrainosus: Secondary | ICD-10-CM | POA: Diagnosis not present

## 2023-08-02 DIAGNOSIS — F331 Major depressive disorder, recurrent, moderate: Secondary | ICD-10-CM | POA: Diagnosis not present

## 2023-08-02 DIAGNOSIS — Z6834 Body mass index (BMI) 34.0-34.9, adult: Secondary | ICD-10-CM

## 2023-08-02 NOTE — Progress Notes (Signed)
 At a Glance:  Vitals Temp: 97.8 F (36.6 C) BP: 110/76 Pulse Rate: 74 SpO2: 99 %   Anthropometric Measurements Height: 5' 4 (1.626 m) Weight: 202 lb (91.6 kg) BMI (Calculated): 34.66 Starting Weight: 202lb Peak Weight: 204lb   Body Composition  Body Fat %: 42.2 % Fat Mass (lbs): 85.4 lbs Muscle Mass (lbs): 111.2 lbs Total Body Water (lbs): 81 lbs Visceral Fat Rating : 9   Other Clinical Data RMR: 1584 Fasting: yes Labs: yes Today's Visit #: 1 Starting Date: 08/02/23    EKG: Normal sinus rhythm, rate 77.  Indirect Calorimeter:   Resting Metabolic Rate ( RMR):  RMR (actual): 1584 kcal RMR (calculated): 1650 kcal The calculated basal metabolic rate is 8349 kcal thus her basal metabolic rate is worse than expected.  Plan:   Indirect calorimeter completed, interpreted and reviewed with patient today and allowed to ask questions.  Discussed the implications for the chosen plan and exercise based on the RMR reading.  Will consider repeating the RMR in the future based on weight loss.    Chief Complaint:  Obesity   Subjective:  Peggy Kelly (MR# [id number]) is a 33 y.o. female who presents for evaluation and treatment of obesity and related comorbidities.   Peggy Kelly is currently in the action stage of change and ready to dedicate time achieving and maintaining a healthier weight. Peggy Kelly is interested in becoming our patient and working on intensive lifestyle modifications including (but not limited to) diet and exercise for weight loss.  Peggy Kelly has been struggling with her weight. She has been unsuccessful in either losing weight, maintaining weight loss, or reaching her healthy weight goal.  Peggy Kelly's habits were reviewed today and are as follows: Her family eats meals together, she thinks her family will eat healthier with her, she started gaining weight after childbirth, and she has significant food cravings issues.  Current or previous  pharmacotherapy: None and Is interested in pharmacotherapy  Response to medication: Never tried medications  Other Fatigue Peggy Kelly denies daytime somnolence and denies waking up still tired. Peggy Kelly generally gets 5 or 6 hours of sleep per night, and states that she has generally restful sleep. Snoring is present occasionally.  Apneic episodes are not present. Epworth Sleepiness Score is 17.   Shortness of Breath Peggy Kelly notes increasing shortness of breath with exercising and seems to be worsening over time with weight gain. She notes getting out of breath sooner with activity than she used to. This has not gotten worse recently. Peggy Kelly denies shortness of breath at rest or orthopnea.  Depression Screen Peggy Kelly's Food and Mood (modified PHQ-9) score was 6. 5-9 mild depression     02/08/2023   10:48 AM  Depression screen PHQ 2/9  Decreased Interest 1  Down, Depressed, Hopeless 1  PHQ - 2 Score 2  Altered sleeping 1  Tired, decreased energy 1  Change in appetite 1  Feeling bad or failure about yourself  1  Trouble concentrating 0  Moving slowly or fidgety/restless 0  Suicidal thoughts 0  PHQ-9 Score 6     Assessment and Plan:   Other Fatigue Peggy Kelly does feel that her weight is causing her energy to be lower than it should be. Fatigue may be related to obesity, depression or many other causes. Labs will be ordered, and in the meanwhile, Peggy Kelly will focus on self care including making healthy food choices, increasing physical activity and focusing on stress reduction.  Shortness of Breath Peggy Kelly does feel that she gets  out of breath more easily that she used to when she exercises. Peggy Kelly's shortness of breath appears to be obesity related and exercise induced. She has agreed to work on weight loss and gradually increase exercise to treat her exercise induced shortness of breath. Will continue to monitor closely.  Health Maintenance:   Obesity   Plan: Will do EKG, indirect  calorimetry, and labs.     Vitamin D  Deficiency Vitamin D  is not at goal of 50.  Most recent vitamin D  level was 41.6. She is at risk for vitamin D  deficiency due to obesity.  She is on vitamin D  at this time.  Lab Results  Component Value Date   VD25OH 41.6 03/30/2022    Plan: Will check for vitamin D  deficiency.   Peggy Kelly had a positive depression screening. Depression is commonly associated with obesity and often results in emotional eating behaviors. We will monitor this closely and work on CBT to help improve the non-hunger eating patterns. Referral to Psychology may be required if no improvement is seen as she continues in our clinic.   Migraines:   She has a history of migraines, and is using Nurtec as needed.   Plan: She will begin the plan.  She will begin exercise incorporating both resistance and cardio.  Iron  deficiency anemia:  States that she has a history of iron  deficiency anemia secondary to heavy cycles.  She is currently taking an iron  supplement and has had iron  infusions in the past.   Plan:   Will do an anemia panel and ferritin level today. She will continue her iron  supplement.   Previous labs reviewed today. Date: 12/22/22 and 02/08/23.  CMP and glucose, and CBC  Labs done today CMP, Lipids, Insulin , HgbA1c, Vit D, Thyroid  Panel, Anemia Panel, and Ferritin   Generalized Obesity: BMI (Calculated): 34.66   Peggy Kelly is currently in the action stage of change and her goal is to begin weight loss efforts. I recommend Peggy Kelly begin the structured treatment plan as follows:  She has agreed to Category 2 Plan  Exercise goals: All adults should avoid inactivity. Some activity is better than none, and adults who participate in any amount of physical activity, gain some health benefits. She goes to an exercise on Monday, and will be taking more classes.   Behavioral modification strategies:increasing lean protein intake, decreasing simple carbohydrates,  increasing vegetables, increase H2O intake, increase high fiber foods, no skipping meals, meal planning and cooking strategies, keeping healthy foods in the home, avoiding temptations, and planning for success  She was informed of the importance of frequent follow-up visits to maximize her success with intensive lifestyle modifications for her multiple health conditions. She was informed we would discuss her lab results at her next visit unless there is a critical issue that needs to be addressed sooner. Shyan agreed to keep her next visit at the agreed upon time to discuss these results.  Objective:  General: Cooperative, alert, well developed, in no acute distress. HEENT: Conjunctivae and lids unremarkable. Cardiovascular: Regular rhythm.  Lungs: Normal work of breathing. Neurologic: No focal deficits.   Lab Results  Component Value Date   CREATININE 0.65 12/22/2022   BUN 8 12/22/2022   NA 137 12/22/2022   K 3.7 12/22/2022   CL 106 12/22/2022   CO2 25 12/22/2022   Lab Results  Component Value Date   ALT 17 12/22/2022   AST 16 12/22/2022   ALKPHOS 43 12/22/2022   BILITOT 0.5 12/22/2022   Lab Results  Component Value Date   HGBA1C 5.8 (H) 07/18/2022   No results found for: INSULIN  Lab Results  Component Value Date   TSH 0.826 11/02/2022   Lab Results  Component Value Date   CHOL 151 01/11/2022   HDL 64 01/11/2022   LDLCALC 78 01/11/2022   TRIG 40 01/11/2022   CHOLHDL 2.4 01/11/2022   Lab Results  Component Value Date   WBC 5.0 02/08/2023   HGB 10.1 (L) 02/08/2023   HCT 32.3 (L) 02/08/2023   MCV 83 02/08/2023   PLT 384 02/08/2023   Lab Results  Component Value Date   IRON  45 01/11/2022   TIBC 348 01/11/2022   FERRITIN 9 (L) 01/11/2022    Attestation Statements:  Applicable history such as the following:  allergies, medications, problem list, medical history, surgical history, family history, social history, and previous encounter notes reviewed by  clinician on day of visit:  Time spent on visit in care of the patient today including the items listed below was 40 minutes.    15 minutes were spent talking about the history, 25 minutes for face to face counseling implementing the plan, discussing the specifics of how to arrange meals, meal planning, water intake.   I spent face to face time discussing his/her plan, including breakfast, additional breakfast options, lunch, and dinner options, grocery list, and snacks.  I reviewed her indirect calorimetry. I discussed the implications for the diet plan.    Discussed the bio-impedence test (fat %, muscle mass, and water weight) and allowed the patient to ask questions.  We discussed factors that can lower once metabolism.   Discussed the following information sheets: Category 2, Grocery List, 100 Calorie Snacks, 200 Calorie Snacks, Microwave Meals, and protein shake sheet. .   I reviewed the labs which were ordered from her visit on 02/08/2023 and 12/22/2022.   I additionally spent time documenting, reviewing, and checking the codes before submitting.   This may have been prepared with the assistance of Engineer, civil (consulting).  Occasional wrong-word or sound-a-like substitutions may have occurred due to the inherent limitations of voice recognition software.    Clayborne Daring, DO

## 2023-08-03 LAB — ANEMIA PANEL
Ferritin: 11 ng/mL — ABNORMAL LOW (ref 15–150)
Folate, Hemolysate: 392 ng/mL
Folate, RBC: 1160 ng/mL (ref >498)
Hematocrit: 33.8 % — ABNORMAL LOW (ref 34.0–46.6)
Iron Saturation: 11 % — ABNORMAL LOW (ref 15–55)
Iron: 40 ug/dL (ref 27–159)
Retic Ct Pct: 1.1 % (ref 0.6–2.6)
Total Iron Binding Capacity: 375 ug/dL (ref 250–450)
UIBC: 335 ug/dL (ref 131–425)
Vitamin B-12: 645 pg/mL (ref 232–1245)

## 2023-08-03 LAB — COMPREHENSIVE METABOLIC PANEL WITH GFR
ALT: 13 IU/L (ref 0–32)
AST: 12 IU/L (ref 0–40)
Albumin: 4.1 g/dL (ref 3.9–4.9)
Alkaline Phosphatase: 63 IU/L (ref 44–121)
BUN/Creatinine Ratio: 15 (ref 9–23)
BUN: 11 mg/dL (ref 6–20)
Bilirubin Total: 0.3 mg/dL (ref 0.0–1.2)
CO2: 18 mmol/L — ABNORMAL LOW (ref 20–29)
Calcium: 9 mg/dL (ref 8.7–10.2)
Chloride: 104 mmol/L (ref 96–106)
Creatinine, Ser: 0.73 mg/dL (ref 0.57–1.00)
Globulin, Total: 2.9 g/dL (ref 1.5–4.5)
Glucose: 85 mg/dL (ref 70–99)
Potassium: 4.1 mmol/L (ref 3.5–5.2)
Sodium: 137 mmol/L (ref 134–144)
Total Protein: 7 g/dL (ref 6.0–8.5)
eGFR: 111 mL/min/1.73 (ref >59)

## 2023-08-03 LAB — TSH+T4F+T3FREE
Free T4: 1.05 ng/dL (ref 0.82–1.77)
T3, Free: 3.4 pg/mL (ref 2.0–4.4)
TSH: 1.12 u[IU]/mL (ref 0.450–4.500)

## 2023-08-03 LAB — LIPID PANEL WITH LDL/HDL RATIO
Cholesterol, Total: 150 mg/dL (ref 100–199)
HDL: 56 mg/dL (ref >39)
LDL Chol Calc (NIH): 81 mg/dL (ref 0–99)
LDL/HDL Ratio: 1.4 ratio (ref 0.0–3.2)
Triglycerides: 67 mg/dL (ref 0–149)
VLDL Cholesterol Cal: 13 mg/dL (ref 5–40)

## 2023-08-03 LAB — HEMOGLOBIN A1C
Est. average glucose Bld gHb Est-mCnc: 117 mg/dL
Hgb A1c MFr Bld: 5.7 % — ABNORMAL HIGH (ref 4.8–5.6)

## 2023-08-03 LAB — INSULIN, RANDOM: INSULIN: 12.1 u[IU]/mL (ref 2.6–24.9)

## 2023-08-03 LAB — VITAMIN D 25 HYDROXY (VIT D DEFICIENCY, FRACTURES): Vit D, 25-Hydroxy: 60.4 ng/mL (ref 30.0–100.0)

## 2023-08-04 DIAGNOSIS — F331 Major depressive disorder, recurrent, moderate: Secondary | ICD-10-CM | POA: Diagnosis not present

## 2023-08-07 ENCOUNTER — Encounter: Payer: Self-pay | Admitting: Dermatology

## 2023-08-08 ENCOUNTER — Encounter: Payer: Self-pay | Admitting: Bariatrics

## 2023-08-08 DIAGNOSIS — R7303 Prediabetes: Secondary | ICD-10-CM | POA: Insufficient documentation

## 2023-08-09 DIAGNOSIS — F331 Major depressive disorder, recurrent, moderate: Secondary | ICD-10-CM | POA: Diagnosis not present

## 2023-08-10 ENCOUNTER — Ambulatory Visit: Payer: Managed Care, Other (non HMO) | Admitting: Dermatology

## 2023-08-11 DIAGNOSIS — F331 Major depressive disorder, recurrent, moderate: Secondary | ICD-10-CM | POA: Diagnosis not present

## 2023-08-16 DIAGNOSIS — F331 Major depressive disorder, recurrent, moderate: Secondary | ICD-10-CM | POA: Diagnosis not present

## 2023-08-17 ENCOUNTER — Ambulatory Visit: Admitting: Bariatrics

## 2023-08-18 DIAGNOSIS — F331 Major depressive disorder, recurrent, moderate: Secondary | ICD-10-CM | POA: Diagnosis not present

## 2023-08-21 ENCOUNTER — Ambulatory Visit (INDEPENDENT_AMBULATORY_CARE_PROVIDER_SITE_OTHER): Admitting: Bariatrics

## 2023-08-21 ENCOUNTER — Encounter: Payer: Self-pay | Admitting: Bariatrics

## 2023-08-21 VITALS — BP 127/76 | HR 68 | Temp 98.1°F | Ht 64.0 in | Wt 205.0 lb

## 2023-08-21 DIAGNOSIS — R790 Abnormal level of blood mineral: Secondary | ICD-10-CM

## 2023-08-21 DIAGNOSIS — E669 Obesity, unspecified: Secondary | ICD-10-CM | POA: Diagnosis not present

## 2023-08-21 DIAGNOSIS — E66812 Obesity, class 2: Secondary | ICD-10-CM

## 2023-08-21 DIAGNOSIS — D509 Iron deficiency anemia, unspecified: Secondary | ICD-10-CM

## 2023-08-21 DIAGNOSIS — Z6835 Body mass index (BMI) 35.0-35.9, adult: Secondary | ICD-10-CM | POA: Diagnosis not present

## 2023-08-21 DIAGNOSIS — R7303 Prediabetes: Secondary | ICD-10-CM | POA: Diagnosis not present

## 2023-08-21 DIAGNOSIS — D500 Iron deficiency anemia secondary to blood loss (chronic): Secondary | ICD-10-CM

## 2023-08-21 NOTE — Progress Notes (Signed)
 First follow-up after initial visit.        WEIGHT SUMMARY AND BIOMETRICS  Weight Lost Since Last Visit: 0  Weight Gained Since Last Visit: 5lb   Vitals Temp: 98.1 F (36.7 C) BP: 127/76 Pulse Rate: 68 SpO2: 99 %   Anthropometric Measurements Height: 5' 4 (1.626 m) Weight: 205 lb (93 kg) BMI (Calculated): 35.17 Weight at Last Visit: 202lb Weight Lost Since Last Visit: 0 Weight Gained Since Last Visit: 5lb Starting Weight: 202lb Total Weight Loss (lbs): 0 lb (0 kg) Peak Weight: 204lb   Body Composition  Body Fat %: 43.7 % Fat Mass (lbs): 89.8 lbs Muscle Mass (lbs): 109.8 lbs Total Body Water (lbs): 82.6 lbs Visceral Fat Rating : 9   Other Clinical Data Fasting: no Labs: no Today's Visit #: 2 Starting Date: 08/02/23    OBESITY Cieara is here to discuss her progress with her obesity treatment plan along with follow-up of her obesity related diagnoses.    Nutrition Plan: the Category 2 plan - 0% adherence.  Current exercise: none  Interim History:  She has been out of the country and has had a death in the family.  Eating all of the food on the plan., Protein intake is as prescribed, Is not skipping meals, and Water intake is adequate.  Initial positives regarding the dietary plan:  Initial challenges regarding  the dietary plan: She has not had time to plan or organize.   Hunger is moderately controlled.  Cravings are moderately controlled.  Assessment/Plan:   Prediabetes Last A1c was 5.7  Medication(s): none She is considering options to help with her hunger and cravings at this time. She is considering breast-feeding for another person's twins.  Lab Results  Component Value Date   HGBA1C 5.7 (H) 08/02/2023   HGBA1C 5.8 (H) 07/18/2022   Lab Results  Component Value Date   INSULIN  12.1 08/02/2023    Plan: Information sheet on   Insulin  Resistance and Prediabetes.  Will minimize all refined carbohydrates both sweets and starches.  Will work on the plan and exercise.  Consider both aerobic and resistance training.  Will keep protein, water, and fiber intake high.  Increase Polyunsaturated and Monounsaturated fats to increase satiety and encourage weight loss.  She will get back to meal preparation.  Discussed a GLP-1 and Phentermine.    Iron  deficiency anemia:   She is taking iron  most days. Her last iron  infusion was about 7 months ago.   Plan:  Referral to hematology.  She will continue her iron  supplement.   Low ferritin:  She has a history of iron  deficiency anemia.  Both her ferritin and iron  levels are lower than normal.  He states she is taking iron  intermittently. Plan: Referral to hematology.    Generalized Obesity: Current BMI BMI (Calculated): 35.17   Pharmacotherapy Plan Discussed medication options.  Information sheet given on GLP-1's and we also discussed phentermine as an option.  Discussed the fact that GLP-1's may be teratogenic and that she cannot take them during pregnancy or during breast-feeding.   Karin is currently in the action stage of change. As such, her goal is to continue with weight loss efforts.  She has agreed to the Category 2 plan.  Exercise goals: For substantial health benefits, adults should do at least 150 minutes (2 hours and 30 minutes) a week of moderate-intensity, or 75 minutes (1 hour and 15 minutes) a week of vigorous-intensity aerobic physical activity, or an equivalent combination of moderate- and vigorous-intensity aerobic  activity. Aerobic activity should be performed in episodes of at least 10 minutes, and preferably, it should be spread throughout the week.  Behavioral modification strategies: increasing lean protein intake, decreasing simple carbohydrates , no meal skipping, meal planning , better snacking choices, planning for success, increasing fiber  rich foods, avoiding temptations, keep healthy foods in the home, weigh protein portions, pack lunch for work, and mindful eating.  Judithann has agreed to follow-up with our clinic in 2 weeks.   Labs reviewed today from last visit (CMP, Lipids, HgbA1c, insulin , vitamin D , B 12, and thyroid  panel).   Objective:   VITALS: Per patient if applicable, see vitals. GENERAL: Alert and in no acute distress. CARDIOPULMONARY: No increased WOB. Speaking in clear sentences.  PSYCH: Pleasant and cooperative. Speech normal rate and rhythm. Affect is appropriate. Insight and judgement are appropriate. Attention is focused, linear, and appropriate.  NEURO: Oriented as arrived to appointment on time with no prompting.   Attestation Statements:   This was prepared with the assistance of Engineer, civil (consulting).  Occasional wrong-word or sound-a-like substitutions may have occurred due to the inherent limitations of voice recognition software.   Clayborne Daring, DO

## 2023-08-23 DIAGNOSIS — F331 Major depressive disorder, recurrent, moderate: Secondary | ICD-10-CM | POA: Diagnosis not present

## 2023-08-25 DIAGNOSIS — F331 Major depressive disorder, recurrent, moderate: Secondary | ICD-10-CM | POA: Diagnosis not present

## 2023-08-29 ENCOUNTER — Other Ambulatory Visit: Payer: Self-pay | Admitting: Certified Nurse Midwife

## 2023-08-29 DIAGNOSIS — O9279 Other disorders of lactation: Secondary | ICD-10-CM

## 2023-08-29 MED ORDER — NORETHINDRONE ACET-ETHINYL EST 1.5-30 MG-MCG PO TABS: 1.0000 | ORAL_TABLET | Freq: Every day | ORAL | 11 refills | Status: DC

## 2023-08-30 DIAGNOSIS — F331 Major depressive disorder, recurrent, moderate: Secondary | ICD-10-CM | POA: Diagnosis not present

## 2023-08-30 MED ORDER — NORETHINDRONE ACET-ETHINYL EST 1.5-30 MG-MCG PO TABS: 1.0000 | ORAL_TABLET | Freq: Every day | ORAL | 11 refills | Status: DC

## 2023-08-30 NOTE — Addendum Note (Signed)
 Addended by: VANNIE MATAR on: 08/30/2023 02:29 PM   Modules accepted: Orders

## 2023-09-01 ENCOUNTER — Encounter: Payer: Self-pay | Admitting: Bariatrics

## 2023-09-01 DIAGNOSIS — F331 Major depressive disorder, recurrent, moderate: Secondary | ICD-10-CM | POA: Diagnosis not present

## 2023-09-04 ENCOUNTER — Ambulatory Visit: Admitting: Bariatrics

## 2023-09-04 NOTE — Telephone Encounter (Signed)
 Please reschedule

## 2023-09-06 DIAGNOSIS — F331 Major depressive disorder, recurrent, moderate: Secondary | ICD-10-CM | POA: Diagnosis not present

## 2023-09-10 DIAGNOSIS — Z3483 Encounter for supervision of other normal pregnancy, third trimester: Secondary | ICD-10-CM | POA: Diagnosis not present

## 2023-09-10 DIAGNOSIS — Z3482 Encounter for supervision of other normal pregnancy, second trimester: Secondary | ICD-10-CM | POA: Diagnosis not present

## 2023-09-13 DIAGNOSIS — F331 Major depressive disorder, recurrent, moderate: Secondary | ICD-10-CM | POA: Diagnosis not present

## 2023-09-15 DIAGNOSIS — F331 Major depressive disorder, recurrent, moderate: Secondary | ICD-10-CM | POA: Diagnosis not present

## 2023-09-18 ENCOUNTER — Encounter: Payer: Self-pay | Admitting: Oncology

## 2023-09-18 ENCOUNTER — Ambulatory Visit (INDEPENDENT_AMBULATORY_CARE_PROVIDER_SITE_OTHER): Admitting: Bariatrics

## 2023-09-18 ENCOUNTER — Encounter: Payer: Self-pay | Admitting: Bariatrics

## 2023-09-18 VITALS — BP 128/82 | HR 60 | Temp 97.9°F | Ht 64.0 in | Wt 205.0 lb

## 2023-09-18 DIAGNOSIS — R7303 Prediabetes: Secondary | ICD-10-CM

## 2023-09-18 DIAGNOSIS — E6609 Other obesity due to excess calories: Secondary | ICD-10-CM

## 2023-09-18 DIAGNOSIS — E669 Obesity, unspecified: Secondary | ICD-10-CM

## 2023-09-18 DIAGNOSIS — Z6835 Body mass index (BMI) 35.0-35.9, adult: Secondary | ICD-10-CM | POA: Diagnosis not present

## 2023-09-18 NOTE — Progress Notes (Signed)
 WEIGHT SUMMARY AND BIOMETRICS  Weight Lost Since Last Visit: 0  Weight Gained Since Last Visit: 0   Vitals Temp: 97.9 F (36.6 C) BP: 128/82 Pulse Rate: 60 SpO2: 99 %   Anthropometric Measurements Height: 5' 4 (1.626 m) Weight: 205 lb (93 kg) BMI (Calculated): 35.17 Weight at Last Visit: 205lb Weight Lost Since Last Visit: 0 Weight Gained Since Last Visit: 0 Starting Weight: 202lb Total Weight Loss (lbs): 0 lb (0 kg) Peak Weight: 204lb   Body Composition  Body Fat %: 42.9 % Fat Mass (lbs): 88.2 lbs Muscle Mass (lbs): 111.2 lbs Total Body Water (lbs): 80.8 lbs Visceral Fat Rating : 9   Other Clinical Data Fasting: no Labs: no Today's Visit #: 3 Starting Date: 08/02/23    OBESITY Peggy Kelly is here to discuss her progress with her obesity treatment plan along with follow-up of her obesity related diagnoses.    Nutrition Plan: the Category 2 plan - 45% adherence.  Current exercise: walking and home exercises/gym.  Interim History:  Her weight remains the same. She has been on the go. She is meal planning and doing her exercise. She is thinking about adopting a child and may start to re-lactate.  Eating all of the food on the plan., Protein intake is as prescribed, Is not skipping meals, and Water intake is adequate.  Hunger is moderately controlled.  Cravings are moderately controlled.  Assessment/Plan:   Prediabetes Last A1c was 5.7  Medication(s): none Lab Results  Component Value Date   HGBA1C 5.7 (H) 08/02/2023   HGBA1C 5.8 (H) 07/18/2022   Lab Results  Component Value Date   INSULIN  12.1 08/02/2023    Plan: Will minimize all refined carbohydrates both sweets and starches.  Will work on the plan and exercise.  Consider both aerobic and resistance training.  Will keep protein, water, and fiber intake high.  Increase  Polyunsaturated and Monounsaturated fats to increase satiety and encourage weight loss.  Aim for 7 to 9 hours of sleep nightly.  She will eat more raw vegetables.  She will minimize her sweets.  She will use her air fryer.     Generalized Obesity: Current BMI BMI (Calculated): 35.17   Pharmacotherapy Plan She is not on any anti-obesity medications. She may consider an anti-obesity medication in the future.   Peggy Kelly is currently in the action stage of change. As such, her goal is to continue with weight loss efforts.  She has agreed to the Category 2 plan.  Exercise goals: For substantial health benefits, adults should do at least 150 minutes (2 hours and 30 minutes) a week of moderate-intensity, or 75 minutes (1 hour and 15 minutes) a week of vigorous-intensity aerobic physical activity, or an equivalent combination of moderate- and vigorous-intensity aerobic activity. Aerobic activity should be performed in episodes of at least 10 minutes, and preferably, it should be spread  throughout the week. She is  closing all of her rings.   Behavioral modification strategies: increasing lean protein intake, no meal skipping, meal planning , increase water intake, better snacking choices, planning for success, increasing vegetables, decrease snacking , avoiding temptations, keep healthy foods in the home, weigh protein portions, and measure portion sizes.  Peggy Kelly has agreed to follow-up with our clinic in 2 weeks.     Objective:   VITALS: Per patient if applicable, see vitals. GENERAL: Alert and in no acute distress. CARDIOPULMONARY: No increased WOB. Speaking in clear sentences.  PSYCH: Pleasant and cooperative. Speech normal rate and rhythm. Affect is appropriate. Insight and judgement are appropriate. Attention is focused, linear, and appropriate.  NEURO: Oriented as arrived to appointment on time with no prompting.   Attestation Statements:   This was prepared with the assistance of  Engineer, civil (consulting).  Occasional wrong-word or sound-a-like substitutions may have occurred due to the inherent limitations of voice recognition   Clayborne Daring, DO

## 2023-09-20 DIAGNOSIS — F331 Major depressive disorder, recurrent, moderate: Secondary | ICD-10-CM | POA: Diagnosis not present

## 2023-09-22 DIAGNOSIS — F331 Major depressive disorder, recurrent, moderate: Secondary | ICD-10-CM | POA: Diagnosis not present

## 2023-09-27 ENCOUNTER — Other Ambulatory Visit: Payer: Self-pay | Admitting: Certified Nurse Midwife

## 2023-09-27 DIAGNOSIS — F331 Major depressive disorder, recurrent, moderate: Secondary | ICD-10-CM | POA: Diagnosis not present

## 2023-09-27 DIAGNOSIS — N920 Excessive and frequent menstruation with regular cycle: Secondary | ICD-10-CM

## 2023-09-27 MED ORDER — TRANEXAMIC ACID 650 MG PO TABS: 1300.0000 mg | ORAL_TABLET | Freq: Three times a day (TID) | ORAL | 2 refills | Status: AC

## 2023-09-28 ENCOUNTER — Telehealth: Payer: Self-pay

## 2023-09-28 NOTE — Telephone Encounter (Signed)
 Peggy Kelly, patient will be scheduled as soon as possible.  Auth Submission: NO AUTH NEEDED Site of care: Site of care: [facility] Payer: [employer] development officer and [employer] medicaid Medication & CPT/J Code(s) submitted: Venofer (Iron  Sucrose) J1756 Diagnosis Code:  Route of submission (phone, fax, portal):  Phone # Fax # Auth type: Buy/Bill PB Units/visits requested: 200mg  x 5 doses Reference number:  Approval from: 09/28/23 to 01/24/24

## 2023-09-29 DIAGNOSIS — F331 Major depressive disorder, recurrent, moderate: Secondary | ICD-10-CM | POA: Diagnosis not present

## 2023-10-03 ENCOUNTER — Encounter: Payer: Self-pay | Admitting: Certified Nurse Midwife

## 2023-10-03 ENCOUNTER — Encounter: Payer: Self-pay | Admitting: Oncology

## 2023-10-04 ENCOUNTER — Other Ambulatory Visit (HOSPITAL_COMMUNITY): Payer: Self-pay

## 2023-10-04 ENCOUNTER — Other Ambulatory Visit: Payer: Self-pay

## 2023-10-04 DIAGNOSIS — Z8669 Personal history of other diseases of the nervous system and sense organs: Secondary | ICD-10-CM

## 2023-10-04 DIAGNOSIS — F331 Major depressive disorder, recurrent, moderate: Secondary | ICD-10-CM | POA: Diagnosis not present

## 2023-10-04 MED ORDER — RIZATRIPTAN BENZOATE 10 MG PO TABS
10.0000 mg | ORAL_TABLET | ORAL | 0 refills | Status: DC | PRN
  Filled 2023-10-04: qty 10, 30d supply, fill #0

## 2023-10-04 NOTE — Telephone Encounter (Signed)
 Refill of rizatriptan  for migraines requested by patient.

## 2023-10-05 DIAGNOSIS — F331 Major depressive disorder, recurrent, moderate: Secondary | ICD-10-CM | POA: Diagnosis not present

## 2023-10-09 ENCOUNTER — Inpatient Hospital Stay: Admitting: Hematology and Oncology

## 2023-10-09 ENCOUNTER — Ambulatory Visit (INDEPENDENT_AMBULATORY_CARE_PROVIDER_SITE_OTHER)

## 2023-10-09 ENCOUNTER — Inpatient Hospital Stay

## 2023-10-09 VITALS — BP 124/82 | HR 64 | Temp 97.6°F | Resp 20 | Ht 64.0 in | Wt 208.0 lb

## 2023-10-09 DIAGNOSIS — D509 Iron deficiency anemia, unspecified: Secondary | ICD-10-CM | POA: Diagnosis not present

## 2023-10-09 DIAGNOSIS — D500 Iron deficiency anemia secondary to blood loss (chronic): Secondary | ICD-10-CM

## 2023-10-09 MED ORDER — IRON SUCROSE 20 MG/ML IV SOLN
200.0000 mg | Freq: Once | INTRAVENOUS | Status: AC
  Administered 2023-10-09: 200 mg via INTRAVENOUS
  Filled 2023-10-09: qty 10

## 2023-10-09 NOTE — Progress Notes (Signed)
 Diagnosis: Iron  Deficiency Anemia  Provider:  Praveen Mannam MD  Procedure: IV Push  IV Type: Peripheral, IV Location: L Antecubital  Venofer  (Iron  Sucrose), Dose: 200 mg  Post Infusion IV Care: Observation period completed  Discharge: Condition: Good, Destination: Home . AVS Declined  Performed by:  Rachelle Bue, RN

## 2023-10-11 ENCOUNTER — Ambulatory Visit

## 2023-10-11 VITALS — BP 118/81 | HR 61 | Temp 97.9°F | Resp 16 | Ht 64.0 in | Wt 207.8 lb

## 2023-10-11 DIAGNOSIS — D509 Iron deficiency anemia, unspecified: Secondary | ICD-10-CM

## 2023-10-11 DIAGNOSIS — F331 Major depressive disorder, recurrent, moderate: Secondary | ICD-10-CM | POA: Diagnosis not present

## 2023-10-11 DIAGNOSIS — D500 Iron deficiency anemia secondary to blood loss (chronic): Secondary | ICD-10-CM

## 2023-10-11 MED ORDER — IRON SUCROSE 20 MG/ML IV SOLN
200.0000 mg | Freq: Once | INTRAVENOUS | Status: AC
  Administered 2023-10-11: 200 mg via INTRAVENOUS
  Filled 2023-10-11: qty 10

## 2023-10-11 MED ORDER — SODIUM CHLORIDE 0.9 % IV BOLUS (SEPSIS)
250.0000 mL | Freq: Once | INTRAVENOUS | Status: AC
  Administered 2023-10-11: 250 mL via INTRAVENOUS
  Filled 2023-10-11: qty 250

## 2023-10-11 NOTE — Progress Notes (Signed)
 Diagnosis: Iron  Deficiency Anemia  Provider:  Mannam, Praveen MD  Procedure: IV Push  Iron  administered with saline running for patient   IV Type: Peripheral, IV Location: L Antecubital  Venofer  (Iron  Sucrose), Dose: 200 mg  Post Infusion IV Care: Observation period completed and Peripheral IV Discontinued  Discharge: Condition: Stable, Destination: Home . AVS Declined  Performed by:  Rocky FORBES Sar, RN

## 2023-10-13 ENCOUNTER — Ambulatory Visit

## 2023-10-13 VITALS — BP 124/86 | HR 67 | Temp 97.9°F | Resp 16 | Ht 64.0 in | Wt 206.0 lb

## 2023-10-13 DIAGNOSIS — D509 Iron deficiency anemia, unspecified: Secondary | ICD-10-CM | POA: Diagnosis not present

## 2023-10-13 DIAGNOSIS — F331 Major depressive disorder, recurrent, moderate: Secondary | ICD-10-CM | POA: Diagnosis not present

## 2023-10-13 DIAGNOSIS — D500 Iron deficiency anemia secondary to blood loss (chronic): Secondary | ICD-10-CM

## 2023-10-13 MED ORDER — IRON SUCROSE 20 MG/ML IV SOLN
200.0000 mg | Freq: Once | INTRAVENOUS | Status: AC
  Administered 2023-10-13: 200 mg via INTRAVENOUS
  Filled 2023-10-13: qty 10

## 2023-10-13 MED ORDER — SODIUM CHLORIDE 0.9 % IV BOLUS (SEPSIS)
250.0000 mL | Freq: Once | INTRAVENOUS | Status: AC
  Administered 2023-10-13: 250 mL via INTRAVENOUS
  Filled 2023-10-13: qty 250

## 2023-10-13 NOTE — Progress Notes (Signed)
 Diagnosis: Iron  Deficiency Anemia  Provider:  Mannam, Praveen MD  Procedure: IV Push  IV Type: Peripheral, IV Location: L Antecubital  Venofer  (Iron  Sucrose), Dose: 200 mg  Iron  administered with NS running for patient comfort.  Post Infusion IV Care: Observation period completed and Peripheral IV Discontinued  Discharge: Condition: Good, Destination: Home . AVS Declined  Performed by:  Rocky FORBES Sar, RN

## 2023-10-16 ENCOUNTER — Ambulatory Visit

## 2023-10-16 VITALS — BP 115/77 | HR 70 | Temp 98.2°F | Resp 22 | Ht 64.0 in | Wt 208.6 lb

## 2023-10-16 DIAGNOSIS — D500 Iron deficiency anemia secondary to blood loss (chronic): Secondary | ICD-10-CM

## 2023-10-16 MED ORDER — SODIUM CHLORIDE 0.9 % IV BOLUS (SEPSIS)
250.0000 mL | Freq: Once | INTRAVENOUS | Status: DC
  Filled 2023-10-16: qty 250

## 2023-10-16 MED ORDER — IRON SUCROSE 20 MG/ML IV SOLN
200.0000 mg | Freq: Once | INTRAVENOUS | Status: DC
  Filled 2023-10-16: qty 10

## 2023-10-16 NOTE — Progress Notes (Signed)
 Pt arrived at clinic to get her Venofer  200 IVP. LPN was able to get IV access in left ac, flushed well and had good blood return. Once LPN started the 250 ml NS, pt stated that IV site started burning.   No swelling or bruising observed at IV site. IV removed per patient request. Patient requested to reschedule infusion for a later date.    LPN looked at the IV site and there was not any swelling or bruising. LPN tried to flush with normal saline but she once again stated it was burning. LPN took the IV out and looked at the right arm but saw nothing there. LPN asked the pt if she wanted someone else to try. Pt stated she would ike to reschedule and once she returns she will need to schedule 1 more appointment.

## 2023-10-18 ENCOUNTER — Ambulatory Visit: Admitting: Bariatrics

## 2023-10-18 ENCOUNTER — Ambulatory Visit

## 2023-10-18 DIAGNOSIS — F331 Major depressive disorder, recurrent, moderate: Secondary | ICD-10-CM | POA: Diagnosis not present

## 2023-10-19 ENCOUNTER — Encounter: Payer: Self-pay | Admitting: Bariatrics

## 2023-10-19 ENCOUNTER — Ambulatory Visit: Admitting: Bariatrics

## 2023-10-19 ENCOUNTER — Encounter: Payer: Self-pay | Admitting: Certified Nurse Midwife

## 2023-10-19 ENCOUNTER — Encounter: Payer: Self-pay | Admitting: Oncology

## 2023-10-19 VITALS — BP 123/84 | HR 62 | Temp 97.9°F | Ht 64.0 in | Wt 205.0 lb

## 2023-10-19 DIAGNOSIS — Z6835 Body mass index (BMI) 35.0-35.9, adult: Secondary | ICD-10-CM | POA: Diagnosis not present

## 2023-10-19 DIAGNOSIS — E669 Obesity, unspecified: Secondary | ICD-10-CM | POA: Diagnosis not present

## 2023-10-19 DIAGNOSIS — E66812 Obesity, class 2: Secondary | ICD-10-CM

## 2023-10-19 DIAGNOSIS — R632 Polyphagia: Secondary | ICD-10-CM | POA: Diagnosis not present

## 2023-10-19 DIAGNOSIS — D509 Iron deficiency anemia, unspecified: Secondary | ICD-10-CM

## 2023-10-19 DIAGNOSIS — D500 Iron deficiency anemia secondary to blood loss (chronic): Secondary | ICD-10-CM

## 2023-10-19 MED ORDER — PHENTERMINE-TOPIRAMATE ER 3.75-23 MG PO CP24: ORAL_CAPSULE | ORAL | 0 refills | Status: DC

## 2023-10-19 MED ORDER — PHENTERMINE-TOPIRAMATE ER 7.5-46 MG PO CP24: ORAL_CAPSULE | ORAL | 0 refills | Status: DC

## 2023-10-19 NOTE — Progress Notes (Unsigned)
                                                                                                              WEIGHT SUMMARY AND BIOMETRICS  Weight Lost Since Last Visit: 0  Weight Gained Since Last Visit: 0   Vitals Temp: 97.9 F (36.6 C) BP: 123/84 Pulse Rate: 62 SpO2: 99 %   Anthropometric Measurements Height: 5' 4 (1.626 m) Weight: 205 lb (93 kg) BMI (Calculated): 35.17 Weight at Last Visit: 205lb Weight Lost Since Last Visit: 0 Weight Gained Since Last Visit: 0 Starting Weight: 202lb Total Weight Loss (lbs): 0 lb (0 kg)   Body Composition  Body Fat %: 44.4 % Fat Mass (lbs): 91.2 lbs Muscle Mass (lbs): 108.4 lbs Total Body Water (lbs): 85 lbs Visceral Fat Rating : 10   Other Clinical Data Fasting: no Labs: no Today's Visit #: 4 Starting Date: 08/02/23    OBESITY Peggy Kelly is here to discuss her progress with her obesity treatment plan along with follow-up of her obesity related diagnoses.    Nutrition Plan: the Category 2 plan - 50% adherence.  Current exercise: walking and exercise classes  Interim History:  Her weight remains the same. She has been busy. Her energy has been low.  Eating all of the food on the plan., Protein intake is as prescribed, and Water intake is adequate.   Hunger is moderately controlled.  Cravings are moderately controlled. She always craves sweets.  Assessment/Plan:      Generalized Obesity: Current BMI BMI (Calculated): 35.17   Pharmacotherapy Plan {dwwmed:29123}  {dwwpharmacotherapy:29109}  [name] {CHL AMB IS/IS NOT:210130109} currently in the action stage of change. As such, her goal is to {MWMwtloss#1:210800005}.  She has agreed to {dwwsldiets:29085}.  Exercise goals: All adults should avoid inactivity. Some physical activity is better than none, and adults who participate in any amount of physical activity gain some health benefits. She joined a gym and is going 3 X a week.    Behavioral modification  strategies: {dwwslwtlossstrategies:29088}.  [name] has agreed to follow-up with our clinic in {NUMBER 1-10:22536} weeks.      Objective:   VITALS: Per patient if applicable, see vitals. GENERAL: Alert and in no acute distress. CARDIOPULMONARY: No increased WOB. Speaking in clear sentences.  PSYCH: Pleasant and cooperative. Speech normal rate and rhythm. Affect is appropriate. Insight and judgement are appropriate. Attention is focused, linear, and appropriate.  NEURO: Oriented as arrived to appointment on time with no prompting.   Attestation Statements:   This was prepared with the assistance of Engineer, civil (consulting).  Occasional wrong-word or sound-a-like substitutions may have occurred due to the inherent limitations of voice recognition

## 2023-10-20 DIAGNOSIS — F331 Major depressive disorder, recurrent, moderate: Secondary | ICD-10-CM | POA: Diagnosis not present

## 2023-10-23 ENCOUNTER — Ambulatory Visit

## 2023-10-23 ENCOUNTER — Encounter: Payer: Self-pay | Admitting: *Deleted

## 2023-10-23 ENCOUNTER — Telehealth: Payer: Self-pay

## 2023-10-23 VITALS — BP 118/81 | HR 73 | Temp 97.4°F | Resp 16 | Ht 64.0 in | Wt 212.0 lb

## 2023-10-23 DIAGNOSIS — D500 Iron deficiency anemia secondary to blood loss (chronic): Secondary | ICD-10-CM

## 2023-10-23 DIAGNOSIS — D509 Iron deficiency anemia, unspecified: Secondary | ICD-10-CM | POA: Diagnosis not present

## 2023-10-23 MED ORDER — IRON SUCROSE 20 MG/ML IV SOLN
200.0000 mg | Freq: Once | INTRAVENOUS | Status: AC
  Administered 2023-10-23: 200 mg via INTRAVENOUS
  Filled 2023-10-23: qty 10

## 2023-10-23 MED ORDER — SODIUM CHLORIDE 0.9 % IV BOLUS (SEPSIS)
250.0000 mL | Freq: Once | INTRAVENOUS | Status: AC
  Administered 2023-10-23: 250 mL via INTRAVENOUS
  Filled 2023-10-23: qty 250

## 2023-10-23 NOTE — Telephone Encounter (Signed)
 Started PA for Qsymia  3.75 ER via covermymeds

## 2023-10-23 NOTE — Progress Notes (Signed)
 Diagnosis: Iron  Deficiency Anemia  Provider:  Praveen Mannam MD  Procedure: IV Push  IV Type: Peripheral, IV Location: L Antecubital  Venofer  (Iron  Sucrose), Dose: 200 mg  Post Infusion IV Care: Observation period completed and Peripheral IV Discontinued  Discharge: Condition: Good, Destination: Home . AVS Declined  Performed by:  Leita FORBES Miles, LPN

## 2023-10-24 NOTE — Telephone Encounter (Signed)
 Message from insurance:  Approved on September 29 by [employer]  The request has been approved. The authorization is effective from 10/23/2023 to 11/06/2023, as long as the member is enrolled in their current health plan. The request was approved as submitted. This request is approved for 1 tablet per day. A written notification letter will follow with additional details.  Effective Date: 10/23/2023 Authorization Expiration Date: 11/06/2023

## 2023-10-25 DIAGNOSIS — F331 Major depressive disorder, recurrent, moderate: Secondary | ICD-10-CM | POA: Diagnosis not present

## 2023-10-26 ENCOUNTER — Ambulatory Visit (INDEPENDENT_AMBULATORY_CARE_PROVIDER_SITE_OTHER)

## 2023-10-26 VITALS — BP 115/76 | HR 70 | Temp 98.9°F | Resp 16 | Ht 64.0 in | Wt 207.0 lb

## 2023-10-26 DIAGNOSIS — D509 Iron deficiency anemia, unspecified: Secondary | ICD-10-CM

## 2023-10-26 DIAGNOSIS — D500 Iron deficiency anemia secondary to blood loss (chronic): Secondary | ICD-10-CM

## 2023-10-26 MED ORDER — SODIUM CHLORIDE 0.9 % IV BOLUS (SEPSIS)
250.0000 mL | Freq: Once | INTRAVENOUS | Status: DC
  Administered 2023-10-26: 250 mL via INTRAVENOUS
  Filled 2023-10-26: qty 250

## 2023-10-26 MED ORDER — IRON SUCROSE 20 MG/ML IV SOLN
200.0000 mg | Freq: Once | INTRAVENOUS | Status: DC
  Administered 2023-10-26: 200 mg via INTRAVENOUS
  Filled 2023-10-26: qty 10

## 2023-10-26 NOTE — Progress Notes (Signed)
 Diagnosis: Iron Deficiency Anemia  Provider:  Chilton Greathouse MD  Procedure: IV Push  IV Type: Peripheral, IV Location: L Antecubital  Venofer (Iron Sucrose), Dose: 200 mg  Post Infusion IV Care: Patient declined observation and Peripheral IV Discontinued  Discharge: Condition: Good, Destination: Home . AVS Declined  Performed by:  Loney Hering, LPN

## 2023-10-27 DIAGNOSIS — F331 Major depressive disorder, recurrent, moderate: Secondary | ICD-10-CM | POA: Diagnosis not present

## 2023-11-01 DIAGNOSIS — F331 Major depressive disorder, recurrent, moderate: Secondary | ICD-10-CM | POA: Diagnosis not present

## 2023-11-03 DIAGNOSIS — F331 Major depressive disorder, recurrent, moderate: Secondary | ICD-10-CM | POA: Diagnosis not present

## 2023-11-08 DIAGNOSIS — F331 Major depressive disorder, recurrent, moderate: Secondary | ICD-10-CM | POA: Diagnosis not present

## 2023-11-10 DIAGNOSIS — F331 Major depressive disorder, recurrent, moderate: Secondary | ICD-10-CM | POA: Diagnosis not present

## 2023-11-15 ENCOUNTER — Ambulatory Visit: Admitting: Bariatrics

## 2023-11-15 ENCOUNTER — Encounter: Payer: Self-pay | Admitting: Bariatrics

## 2023-11-15 ENCOUNTER — Encounter: Payer: Self-pay | Admitting: Oncology

## 2023-11-15 ENCOUNTER — Other Ambulatory Visit (HOSPITAL_COMMUNITY): Payer: Self-pay

## 2023-11-15 VITALS — BP 128/78 | HR 64 | Temp 97.9°F | Ht 64.0 in | Wt 198.0 lb

## 2023-11-15 DIAGNOSIS — E669 Obesity, unspecified: Secondary | ICD-10-CM

## 2023-11-15 DIAGNOSIS — Z6833 Body mass index (BMI) 33.0-33.9, adult: Secondary | ICD-10-CM | POA: Diagnosis not present

## 2023-11-15 DIAGNOSIS — F331 Major depressive disorder, recurrent, moderate: Secondary | ICD-10-CM | POA: Diagnosis not present

## 2023-11-15 DIAGNOSIS — E6609 Other obesity due to excess calories: Secondary | ICD-10-CM

## 2023-11-15 DIAGNOSIS — R790 Abnormal level of blood mineral: Secondary | ICD-10-CM | POA: Diagnosis not present

## 2023-11-15 DIAGNOSIS — R632 Polyphagia: Secondary | ICD-10-CM | POA: Diagnosis not present

## 2023-11-15 MED ORDER — PHENTERMINE-TOPIRAMATE ER 7.5-46 MG PO CP24
1.0000 | ORAL_CAPSULE | Freq: Every morning | ORAL | 0 refills | Status: DC
  Filled 2023-11-15: qty 30, 30d supply, fill #0

## 2023-11-15 NOTE — Progress Notes (Signed)
 WEIGHT SUMMARY AND BIOMETRICS  Weight Lost Since Last Visit: 7lb  Weight Gained Since Last Visit: 0   Vitals Temp: 97.9 F (36.6 C) BP: 128/78 Pulse Rate: 64 SpO2: 100 %   Anthropometric Measurements Height: 5' 4 (1.626 m) Weight: 198 lb (89.8 kg) BMI (Calculated): 33.97 Weight at Last Visit: 205lb Weight Lost Since Last Visit: 7lb Weight Gained Since Last Visit: 0 Starting Weight: 202lb Total Weight Loss (lbs): 4 lb (1.814 kg)   Body Composition  Body Fat %: 42.3 % Fat Mass (lbs): 83.8 lbs Muscle Mass (lbs): 108.6 lbs Total Body Water (lbs): 80.6 lbs Visceral Fat Rating : 9   Other Clinical Data Fasting: no Labs: no Today's Visit #: 5 Starting Date: 08/02/23    OBESITY Peggy Kelly is here to discuss her progress with her obesity treatment plan along with follow-up of her obesity related diagnoses.    Nutrition Plan: the Category 2 plan - 50% adherence.  Current exercise: walking and workout classes  Interim History:  She is down 7 lbs since her last visit. She is doing some meal planning.  Eating all of the food on the plan., Protein intake is as prescribed, and Water intake is adequate.   Pharmacotherapy: Peggy Kelly is on Qsymia  7.5/46 mg 1 capsule by mouth daily in am, Has been on the lower dose of Qsymia  at 3.75/23 mg but ready to go up to the next dose.  Adverse side effects: None Hunger is well controlled.  Cravings are well controlled.  Assessment/Plan:   Peggy Kelly endorses excessive hunger.  Medication(s): Qsymia  Effects of medication:  moderately controlled. Cravings are moderately controlled.   Plan: Medication(s): Qsymia  7.5/46 mg 1 capsule by mouth daily in am Will increase water, protein and fiber to help assuage hunger.  Will minimize foods that have a high glucose index/load to minimize reactive hypoglycemia.   Low  Ferritin level:   Her last ferritin level was low.  She has a history of low iron .  She has had iron  transfusions and the past.  She states that she has had an iron  infusions since her last visit and her fatigue has improved.  Plan:  Will follow-up with hematology as needed.   Generalized Obesity: Current BMI BMI (Calculated): 33.97   Pharmacotherapy Plan Continue  Qsymia  7.5/46 mg 1 capsule by mouth daily in am  Peggy Kelly is currently in the action stage of change. As such, her goal is to continue with weight loss efforts.  She has agreed to the Category 2 plan.  Exercise goals: All adults should avoid inactivity. Some physical activity is better than none, and adults who participate in any amount of physical activity gain some health benefits.  Behavioral modification strategies: increasing lean protein intake, increase water intake, better snacking choices, planning for success, decrease snacking , keep healthy foods in the home, weigh protein portions, measure portion sizes,  and mindful eating.  Peggy Kelly has agreed to follow-up with our clinic in 4 weeks.    Objective:   VITALS: Per patient if applicable, see vitals. GENERAL: Alert and in no acute distress. CARDIOPULMONARY: No increased WOB. Speaking in clear sentences.  PSYCH: Pleasant and cooperative. Speech normal rate and rhythm. Affect is appropriate. Insight and judgement are appropriate. Attention is focused, linear, and appropriate.  NEURO: Oriented as arrived to appointment on time with no prompting.   Attestation Statements:   This was prepared with the assistance of Engineer, civil (consulting).  Occasional wrong-word or sound-a-like substitutions may have occurred due to the inherent limitations of voice recognition   Clayborne Daring, DO

## 2023-11-16 ENCOUNTER — Ambulatory Visit: Admitting: Bariatrics

## 2023-11-17 DIAGNOSIS — F331 Major depressive disorder, recurrent, moderate: Secondary | ICD-10-CM | POA: Diagnosis not present

## 2023-11-20 ENCOUNTER — Other Ambulatory Visit: Payer: Self-pay | Admitting: Family Medicine

## 2023-11-20 DIAGNOSIS — D500 Iron deficiency anemia secondary to blood loss (chronic): Secondary | ICD-10-CM

## 2023-11-21 ENCOUNTER — Other Ambulatory Visit (HOSPITAL_COMMUNITY): Payer: Self-pay

## 2023-11-21 ENCOUNTER — Other Ambulatory Visit: Payer: Self-pay

## 2023-11-21 DIAGNOSIS — Z8669 Personal history of other diseases of the nervous system and sense organs: Secondary | ICD-10-CM

## 2023-11-21 MED ORDER — RIZATRIPTAN BENZOATE 10 MG PO TABS
10.0000 mg | ORAL_TABLET | ORAL | 0 refills | Status: DC | PRN
  Filled 2023-11-21: qty 10, 30d supply, fill #0

## 2023-11-22 DIAGNOSIS — F331 Major depressive disorder, recurrent, moderate: Secondary | ICD-10-CM | POA: Diagnosis not present

## 2023-11-23 ENCOUNTER — Other Ambulatory Visit: Payer: Self-pay

## 2023-11-23 ENCOUNTER — Encounter: Payer: Self-pay | Admitting: Dermatology

## 2023-11-23 ENCOUNTER — Ambulatory Visit: Admitting: Dermatology

## 2023-11-23 ENCOUNTER — Other Ambulatory Visit (HOSPITAL_COMMUNITY): Payer: Self-pay

## 2023-11-23 DIAGNOSIS — L219 Seborrheic dermatitis, unspecified: Secondary | ICD-10-CM | POA: Diagnosis not present

## 2023-11-23 DIAGNOSIS — L700 Acne vulgaris: Secondary | ICD-10-CM | POA: Diagnosis not present

## 2023-11-23 MED ORDER — CLOBETASOL PROPIONATE 0.05 % EX SOLN
1.0000 | Freq: Two times a day (BID) | CUTANEOUS | 2 refills | Status: AC | PRN
  Filled 2023-11-23: qty 50, 30d supply, fill #0

## 2023-11-23 MED ORDER — TRETINOIN 0.025 % EX CREA
TOPICAL_CREAM | Freq: Every day | CUTANEOUS | 6 refills | Status: AC
  Filled 2023-11-23: qty 45, 90d supply, fill #0

## 2023-11-23 MED ORDER — CLINDAMYCIN PHOSPHATE 1 % EX SWAB
1.0000 | Freq: Every day | CUTANEOUS | 4 refills | Status: AC
  Filled 2023-11-23: qty 60, 60d supply, fill #0

## 2023-11-23 MED ORDER — SPIRONOLACTONE 100 MG PO TABS
100.0000 mg | ORAL_TABLET | Freq: Every day | ORAL | 6 refills | Status: AC
  Filled 2023-11-23: qty 30, 30d supply, fill #0

## 2023-11-23 MED ORDER — CLOBETASOL PROPIONATE 0.05 % EX CREA
TOPICAL_CREAM | CUTANEOUS | 2 refills | Status: AC
Start: 2023-11-23 — End: ?
  Filled 2023-11-23: qty 45, 30d supply, fill #0

## 2023-11-23 NOTE — Progress Notes (Signed)
 Follow-Up Visit   Subjective  Peggy Kelly is a 33 y.o. female who presents for the following: Acne and Seborrheic Dermatitis   Acne Patient was last evaluated on 03/09/23. At this visit patient was prescribed Adapalene (Effaclar) 0.1% gel every other night Recommended use of La Roche-Posay hydrating wash, Toleriane UV moisturizer for AM use and Toleriane Double repair moisturizer for PM over Effaclar.  Patient reports sxs are better. Patient reports breakouts only occur once a month due to hormonal changes. Patient reports she is satisfied with and follows current regimen Patient reports she has facials done once month Patient reports medication changes.   Seborrheic Dermatitis - Scalp and Neck Recommended use of DHS Zinc shampoo Prescribed Derma-smoothe  oil Prescribed Clobetasol  0.05% solution twice daily as needed for itching/irritation  Patient reports that sxs are better Patient reports she did not use DHS as she couldn't find it. Patient reports she has been using [place] 'dandrasoft' shampoo and Community Education Officer with rosemary mint oil Patient has been using Derma-smooth as recommended by letting it sit on scalp before rinsing out  Patient reports she has been using Clobetasol  solution on scalp and neck Patient states irritation 8-9/10 before washing her hair which is once a week Right after a wash patient states irritation 4/10   The following portions of the chart were reviewed this encounter and updated as appropriate: medications, allergies, medical history  Review of Systems:  No other skin or systemic complaints except as noted in HPI or Assessment and Plan.  Objective  Well appearing patient in no apparent distress; mood and affect are within normal limits.   A focused examination was performed of the following areas: Face and Scalp  Relevant exam findings are noted in the Assessment and Plan.            Assessment & Plan  ACNE VULGARIS Exam:  Open comedones and inflammatory papules on face  Not at goal  Treatment: Prescribed:  Spironolactone 100 mg to take daily with dinner Tretinoin 0.025% to use nightly three times a week M/W/F Clindamycin  1% swab to use in AM Advised to use [place] Cicalfate at PM beforeTretinoin and at AM   SEBORRHEIC DERMATITIS Exam: Pink patches with greasy scale at scalp and neck  improved  Seborrheic Dermatitis is a chronic persistent rash characterized by pinkness and scaling most commonly of the mid face but also can occur on the scalp (dandruff), ears; mid chest, mid back and groin.  It tends to be exacerbated by stress and cooler weather.  People who have neurologic disease may experience new onset or exacerbation of existing seborrheic dermatitis. The condition is not curable but treatable and can be controlled.  Treatment Plan: Continue use of derma-smoothe  oil Recommended DHS Zinc shampoo Recommended clobetasol  solution as needed for scalp Recommended clobetasol  cream as needed for neck   ACNE VULGARIS   Related Medications spironolactone (ALDACTONE) 100 MG tablet Take 1 tablet (100 mg total) by mouth daily. Take 1 tablet daily with a meal clindamycin  (CLEOCIN  T) 1 % SWAB Apply 1 Application topically daily. tretinoin (RETIN-A) 0.025 % cream Apply topically at bedtime. Apply topically at bedtime three nights a week M/W/F SEBORRHEIC DERMATITIS   Related Medications clobetasol  cream (TEMOVATE ) 0.05 % Apply twice a day to affected areas on body up to 2 weeks as needed for eczema. Avoid applying to face, groin, and axilla. clobetasol  (TEMOVATE ) 0.05 % external solution Apply twice daily to affected areas on scalp as needed for itching/irritation. Avoid applying to  face, groin, and axilla.  Return in about 6 months (around 05/23/2024) for Acne and Seb Derm F/U.  I, Lyle Cords, as acting as a neurosurgeon for [employer], DO .   Documentation: I have reviewed the above documentation  for accuracy and completeness, and I agree with the above.  Delon Lenis, DO

## 2023-11-23 NOTE — Patient Instructions (Addendum)

## 2023-11-24 ENCOUNTER — Other Ambulatory Visit: Payer: Self-pay

## 2023-11-24 DIAGNOSIS — F331 Major depressive disorder, recurrent, moderate: Secondary | ICD-10-CM | POA: Diagnosis not present

## 2023-11-29 DIAGNOSIS — F331 Major depressive disorder, recurrent, moderate: Secondary | ICD-10-CM | POA: Diagnosis not present

## 2023-12-01 DIAGNOSIS — F331 Major depressive disorder, recurrent, moderate: Secondary | ICD-10-CM | POA: Diagnosis not present

## 2023-12-06 ENCOUNTER — Other Ambulatory Visit: Payer: Self-pay | Admitting: Certified Nurse Midwife

## 2023-12-06 DIAGNOSIS — D508 Other iron deficiency anemias: Secondary | ICD-10-CM | POA: Diagnosis not present

## 2023-12-06 DIAGNOSIS — F331 Major depressive disorder, recurrent, moderate: Secondary | ICD-10-CM | POA: Diagnosis not present

## 2023-12-07 ENCOUNTER — Ambulatory Visit: Payer: Self-pay | Admitting: Certified Nurse Midwife

## 2023-12-07 LAB — ANEMIA PROFILE B
Basophils Absolute: 0.1 x10E3/uL (ref 0.0–0.2)
Basos: 1 %
EOS (ABSOLUTE): 0.1 x10E3/uL (ref 0.0–0.4)
Eos: 2 %
Ferritin: 165 ng/mL — ABNORMAL HIGH (ref 15–150)
Folate: 9.2 ng/mL (ref >3.0)
Hematocrit: 36.4 % (ref 34.0–46.6)
Hemoglobin: 11.7 g/dL (ref 11.1–15.9)
Immature Grans (Abs): 0 x10E3/uL (ref 0.0–0.1)
Immature Granulocytes: 0 %
Iron Saturation: 25 % (ref 15–55)
Iron: 69 ug/dL (ref 27–159)
Lymphocytes Absolute: 2.9 x10E3/uL (ref 0.7–3.1)
Lymphs: 46 %
MCH: 26.7 pg (ref 26.6–33.0)
MCHC: 32.1 g/dL (ref 31.5–35.7)
MCV: 83 fL (ref 79–97)
Monocytes Absolute: 0.4 x10E3/uL (ref 0.1–0.9)
Monocytes: 7 %
Neutrophils Absolute: 2.7 x10E3/uL (ref 1.4–7.0)
Neutrophils: 44 %
Platelets: 329 x10E3/uL (ref 150–450)
RBC: 4.38 x10E6/uL (ref 3.77–5.28)
RDW: 17.8 % — ABNORMAL HIGH (ref 11.7–15.4)
Retic Ct Pct: 0.9 % (ref 0.6–2.6)
Total Iron Binding Capacity: 280 ug/dL (ref 250–450)
UIBC: 211 ug/dL (ref 131–425)
Vitamin B-12: 532 pg/mL (ref 232–1245)
WBC: 6.2 x10E3/uL (ref 3.4–10.8)

## 2023-12-08 DIAGNOSIS — F331 Major depressive disorder, recurrent, moderate: Secondary | ICD-10-CM | POA: Diagnosis not present

## 2023-12-13 ENCOUNTER — Other Ambulatory Visit (HOSPITAL_COMMUNITY): Payer: Self-pay

## 2023-12-13 ENCOUNTER — Encounter: Payer: Self-pay | Admitting: Bariatrics

## 2023-12-13 ENCOUNTER — Ambulatory Visit: Admitting: Bariatrics

## 2023-12-13 VITALS — BP 119/78 | HR 76 | Ht 64.0 in | Wt 192.0 lb

## 2023-12-13 DIAGNOSIS — R632 Polyphagia: Secondary | ICD-10-CM | POA: Diagnosis not present

## 2023-12-13 DIAGNOSIS — R7303 Prediabetes: Secondary | ICD-10-CM

## 2023-12-13 DIAGNOSIS — F331 Major depressive disorder, recurrent, moderate: Secondary | ICD-10-CM | POA: Diagnosis not present

## 2023-12-13 DIAGNOSIS — E669 Obesity, unspecified: Secondary | ICD-10-CM

## 2023-12-13 DIAGNOSIS — E6609 Other obesity due to excess calories: Secondary | ICD-10-CM

## 2023-12-13 DIAGNOSIS — Z6832 Body mass index (BMI) 32.0-32.9, adult: Secondary | ICD-10-CM

## 2023-12-13 MED ORDER — PHENTERMINE-TOPIRAMATE ER 7.5-46 MG PO CP24
1.0000 | ORAL_CAPSULE | Freq: Every morning | ORAL | 0 refills | Status: DC
  Filled 2023-12-13 – 2023-12-15 (×2): qty 30, 30d supply, fill #0

## 2023-12-13 NOTE — Progress Notes (Signed)
 WEIGHT SUMMARY AND BIOMETRICS  Weight Lost Since Last Visit: 6lb  Weight Gained Since Last Visit: 0   Vitals BP: 119/78 Pulse Rate: 76 SpO2: 100 %   Anthropometric Measurements Height: 5' 4 (1.626 m) Weight: 192 lb (87.1 kg) BMI (Calculated): 32.94 Weight at Last Visit: 198lb Weight Lost Since Last Visit: 6lb Weight Gained Since Last Visit: 0 Starting Weight: 202lb Total Weight Loss (lbs): 10 lb (4.536 kg)   Body Composition  Body Fat %: 41.9 % Fat Mass (lbs): 80.4 lbs Muscle Mass (lbs): 106 lbs Total Body Water (lbs): 79 lbs Visceral Fat Rating : 8   Other Clinical Data Fasting: no Labs: no Today's Visit #: 6 Starting Date: 08/02/23    OBESITY Peggy Kelly is here to discuss her progress with her obesity treatment plan along with follow-up of her obesity related diagnoses.    Nutrition Plan: the Category 2 plan - 80% adherence.  Current exercise: Goes to the gym for exercise and walking.  Interim History:  She is down 6 lbs since her last visit. She is taking Qsymia .  Eating all of the food on the plan., Protein intake is as prescribed, and Water intake is adequate.   Pharmacotherapy: Peggy Kelly is on Qsymia  7.5/46 mg 1 capsule by mouth daily in am Adverse side effects: None Hunger is moderately controlled.  Cravings are moderately controlled.  Assessment/Plan:   Prediabetes Last A1c was 5.7  Medication(s): none Lab Results  Component Value Date   HGBA1C 5.7 (H) 08/02/2023   HGBA1C 5.8 (H) 07/18/2022   Lab Results  Component Value Date   INSULIN  12.1 08/02/2023    Plan: Will minimize all refined carbohydrates both sweets and starches.  Will work on the plan and exercise.  dContinue both aerobic and resistance training.  Will keep protein, water, and fiber intake high.  Thanksgiving holiday strategies. Continue and refill Qsymia   7.5/46 mg 1 capsule by mouth daily in am   Polyphagia Peggy Kelly endorses excessive hunger.  Medication(s): none Effects of medication:  moderately controlled. Cravings are moderately controlled.   Plan: Medication(s): Qsymia  7.5/46 mg 1 capsule by mouth daily in am Will increase water, protein and fiber to help assuage hunger.  Will minimize foods that have a high glucose index/load to minimize reactive hypoglycemia.  Discussed holiday strategies.  Will not drink soda.   Generalized Obesity: Current BMI BMI (Calculated): 32.94   Pharmacotherapy Plan Continue and refill  Qsymia  7.5/46 mg 1 capsule by mouth daily in am  Peggy Kelly is currently in the action stage of change. As such, her goal is to continue with weight loss efforts.  She has agreed to the Category 2 plan.  Exercise goals: For substantial health benefits, adults should do at least 150 minutes (2 hours and 30 minutes) a week of moderate-intensity, or 75 minutes (1 hour and 15 minutes) a week of vigorous-intensity aerobic physical  activity, or an equivalent combination of moderate- and vigorous-intensity aerobic activity. Aerobic activity should be performed in episodes of at least 10 minutes, and preferably, it should be spread throughout the week. She is doing considerable exercise: walking, step, classes at the gym on a regular basis.   Behavioral modification strategies: increasing lean protein intake, decrease eating out, meal planning , increase water intake, better snacking choices, travel eating strategies, weigh protein portions, measure portion sizes, and mindful eating.  Peggy Kelly has agreed to follow-up with our clinic in 4 weeks.    Objective:   VITALS: Per patient if applicable, see vitals. GENERAL: Alert and in no acute distress. CARDIOPULMONARY: No increased WOB. Speaking in clear sentences.  PSYCH: Pleasant and cooperative. Speech normal rate and rhythm. Affect is appropriate. Insight and judgement are  appropriate. Attention is focused, linear, and appropriate.  NEURO: Oriented as arrived to appointment on time with no prompting.   Attestation Statements:   This was prepared with the assistance of Engineer, Civil (consulting).  Occasional wrong-word or sound-a-like substitutions may have occurred due to the inherent limitations of voice recognition   Clayborne Daring, DO

## 2023-12-15 ENCOUNTER — Other Ambulatory Visit (HOSPITAL_COMMUNITY): Payer: Self-pay

## 2023-12-15 DIAGNOSIS — F331 Major depressive disorder, recurrent, moderate: Secondary | ICD-10-CM | POA: Diagnosis not present

## 2023-12-18 ENCOUNTER — Ambulatory Visit
Admission: RE | Admit: 2023-12-18 | Discharge: 2023-12-18 | Disposition: A | Discharge status: Home / Self Care | Attending: Family Medicine | Admitting: Family Medicine

## 2023-12-18 ENCOUNTER — Encounter: Payer: Self-pay | Admitting: Oncology

## 2023-12-18 ENCOUNTER — Other Ambulatory Visit (HOSPITAL_COMMUNITY): Payer: Self-pay

## 2023-12-18 ENCOUNTER — Other Ambulatory Visit: Payer: Self-pay

## 2023-12-18 VITALS — BP 117/79 | HR 88 | Temp 98.3°F | Resp 18 | Ht 64.0 in | Wt 192.0 lb

## 2023-12-18 DIAGNOSIS — R110 Nausea: Secondary | ICD-10-CM

## 2023-12-18 DIAGNOSIS — R519 Headache, unspecified: Secondary | ICD-10-CM

## 2023-12-18 DIAGNOSIS — Z8669 Personal history of other diseases of the nervous system and sense organs: Secondary | ICD-10-CM

## 2023-12-18 DIAGNOSIS — G43009 Migraine without aura, not intractable, without status migrainosus: Secondary | ICD-10-CM | POA: Diagnosis not present

## 2023-12-18 MED ORDER — ONDANSETRON 8 MG PO TBDP
8.0000 mg | ORAL_TABLET | Freq: Three times a day (TID) | ORAL | 0 refills | Status: AC | PRN
  Filled 2023-12-18: qty 24, 8d supply, fill #0

## 2023-12-18 MED ORDER — KETOROLAC TROMETHAMINE 60 MG/2ML IM SOLN
30.0000 mg | Freq: Once | INTRAMUSCULAR | Status: AC
  Administered 2023-12-18: 30 mg via INTRAMUSCULAR

## 2023-12-18 MED ORDER — RIZATRIPTAN BENZOATE 10 MG PO TABS
10.0000 mg | ORAL_TABLET | ORAL | 0 refills | Status: DC | PRN
  Filled 2023-12-18: qty 10, 30d supply, fill #0

## 2023-12-18 MED ORDER — SODIUM FLUORIDE 1.1 % DT PSTE
1.0000 | PASTE | Freq: Every day | DENTAL | 4 refills | Status: AC
  Filled 2023-12-18 (×2): qty 100, 30d supply, fill #0

## 2023-12-18 MED ORDER — ONDANSETRON 8 MG PO TBDP
8.0000 mg | ORAL_TABLET | Freq: Once | ORAL | Status: AC
  Administered 2023-12-18: 8 mg via ORAL

## 2023-12-18 NOTE — ED Provider Notes (Signed)
 [facility]    CSN: [id number] Arrival date & time: 12/18/23  1730      History   Chief Complaint Chief Complaint  Patient presents with   Migraine    Entered by patient    HPI Peggy Kelly is a 33 y.o. female.   HPI 33 year old female presents with migraine x 2 days.  Patient reports taking rizatriptan , Excedrin Migraine and Nurtec yesterday all were ineffective.  Patient is complaining of nausea as well.  PMH significant for migraine, anxiety, and iron  deficiency anemia.  Past Medical History:  Diagnosis Date   Allergy    Anemia    Anxiety    Back pain    Cesarean delivery delivered 08/20/2017   Joint pain    Migraines    Missed abortion 12/15/2022   Seasonal allergies    Vaginal Pap smear, abnormal    Vitamin D  deficiency     Patient Active Problem List   Diagnosis Date Noted   Prediabetes 08/08/2023   Anxiety 02/07/2023   Arthralgia of left knee 02/06/2023   History of miscarriage 01/10/2023   Migraine without aura and without status migrainosus, not intractable 08/26/2022   Eczema 01/11/2022   Seborrheic eczema of scalp 01/11/2022   Iron  deficiency anemia 02/25/2014    Past Surgical History:  Procedure Laterality Date   APPENDECTOMY     CESAREAN SECTION N/A 08/20/2017   Procedure: CESAREAN SECTION;  Surgeon: Marget Lenis, MD;  Location: [facility] BIRTHING SU[facility];  Service: Obstetrics;  Laterality: N/A;   COLPOSCOPY N/A 05/09/2013   Procedure: COLPOSCOPY WITH ECC;  Surgeon: Truman Corona, MD;  Location: [facility] ORS;  Service: Gynecology;  Laterality: N/A;   DILATATION & CURETTAGE/HYSTEROSCOPY WITH TRUECLEAR N/A 05/09/2013   Procedure: DILATATION & CURETTAGE/HYSTEROSCOPY WITH TRUCLEAR;  Surgeon: Truman Corona, MD;  Location: [facility] ORS;  Service: Gynecology;  Laterality: N/A;   DILATION AND EVACUATION N/A 12/15/2022   Procedure: DILATATION AND EVACUATION;  Surgeon: Zina Jerilynn LABOR, MD;  Location: [facility] OR;  Service: Gynecology;  Laterality: N/A;   LEEP       OB History     Gravida  2   Para  1   Term  1   Preterm  0   AB  0   Living  1      SAB  0   IAB  0   Ectopic  0   Multiple      Live Births  1            Home Medications    Prior to Admission medications   Medication Sig Start Date End Date Taking? Authorizing Provider  ondansetron  (ZOFRAN -ODT) 8 MG disintegrating tablet Take 1 tablet (8 mg total) by mouth every 8 (eight) hours as needed for nausea or vomiting. 12/18/23  Yes Teddy Sharper, FNP  albuterol  (VENTOLIN  HFA) 108 (90 Base) MCG/ACT inhaler Inhale 2 puffs into the lungs every 6 (six) hours as needed for wheezing or shortness of breath. 05/31/23   Vannie Cornell JONELLE, CNM  atomoxetine  (STRATTERA ) 40 MG capsule Take 1 capsule (40 mg total) by mouth daily. 06/21/23   Walker, Jamilla R, CNM  Cholecalciferol (VITAMIN D3 PO) daily.    [provider]  clindamycin  (CLEOCIN  T) 1 % SWAB Apply 1 Application topically daily. 11/23/23   Lenis Delon SAILOR, DO  clobetasol  (TEMOVATE ) 0.05 % external solution Apply twice daily to affected areas on scalp as needed for itching/irritation. Avoid applying to face, groin, and axilla. 11/23/23   Lenis Delon  N, DO  clobetasol  cream (TEMOVATE ) 0.05 % Apply twice a day to affected areas on body up to 2 weeks as needed for eczema. Avoid applying to face, groin, and axilla. 11/23/23   Alm Delon SAILOR, DO  fluocinolone  (SYNALAR ) 0.01 % external solution Apply topically daily. 11/02/22   Walker, Jamilla R, CNM  Fluocinolone  Acetonide Scalp (DERMA-SMOOTHE /FS SCALP) 0.01 % OIL Apply to affected areas on scalp at least 2 hours prior to shampooing. 03/09/23   Alm Delon SAILOR, DO  fluticasone  (FLOVENT  HFA) 110 MCG/ACT inhaler Inhale 2 puffs into the lungs daily. Patient not taking: Reported on 12/13/2023 06/12/23   Vannie Cornell SAUNDERS, CNM  ketoconazole  (NIZORAL ) 2 % shampoo Apply topically 2 (two) times a week. 11/03/22   Vannie Cornell SAUNDERS, CNM  magnesium oxide (MAG-OX) 400  (240 Mg) MG tablet Take 0.5 tablets (200 mg total) by mouth at bedtime. 01/30/23   Vannie Cornell SAUNDERS, CNM  metoprolol  succinate (TOPROL -XL) 25 MG 24 hr tablet Take 1 tablet (25 mg total) by mouth daily. 07/27/23   Vannie Cornell SAUNDERS, CNM  Phentermine -Topiramate  ER 7.5-46 MG CP24 Take 1 capsule by mouth every morning. 12/13/23   Delores Shields A, DO  promethazine -dextromethorphan (PROMETHAZINE -DM) 6.25-15 MG/5ML syrup Take 5 mLs by mouth 3 (three) times daily as needed for cough. Patient not taking: Reported on 12/13/2023 05/31/23   Vannie Cornell SAUNDERS, CNM  Rimegepant Sulfate (NURTEC) 75 MG TBDP Take 1 tablet (75 mg total) by mouth every other day. 04/21/23   Nance Gaskins, Darice BRAVO, PA-C  rizatriptan  (MAXALT ) 10 MG tablet Take 1 tablet (10 mg total) by mouth as needed for migraine. May repeat in 2 hours if needed 12/18/23   Teddy Sharper, FNP  Sodium Fluoride  1.1 % PSTE Apply a pea sized amount onto brush, apply to all surfaces of the teeth for 2 minutes daily and spit out. 12/18/23     spironolactone  (ALDACTONE ) 100 MG tablet Take 1 tablet (100 mg total) by mouth daily with a meal. 11/23/23   Alm Delon SAILOR, DO  tranexamic acid  (LYSTEDA ) 650 MG TABS tablet Take 2 tablets (1,300 mg total) by mouth 3 (three) times daily. Take during menses for a maximum of five days Patient not taking: Reported on 12/13/2023 09/27/23   Vannie Cornell SAUNDERS, CNM  traZODone  (DESYREL ) 50 MG tablet TAKE 1 TABLET(50 MG) BY MOUTH TWICE DAILY AS NEEDED FOR SLEEP Patient not taking: Reported on 12/13/2023 02/18/23   Vannie Cornell SAUNDERS, CNM  tretinoin  (RETIN-A ) 0.025 % cream Apply topically at bedtime three nights a week (Monday, Wednesday, and Friday). 11/23/23 11/22/24  Alm Delon SAILOR, DO    Family History Family History  Problem Relation Age of Onset   Thyroid  disease Mother    Migraines Sister    Other Sister        anti NMDA receptor encephalitis   Thyroid  disease Maternal Grandmother    Cancer Maternal Grandfather         prostate    Social History Social History   Tobacco Use   Smoking status: Never   Smokeless tobacco: Never  Vaping Use   Vaping status: Never Used  Substance Use Topics   Alcohol use: Not Currently    Comment: occassional, 09/23/15 none now   Drug use: No     Allergies   Penicillins   Review of Systems Review of Systems  Neurological:  Positive for headaches.     Physical Exam Triage Vital Signs ED Triage Vitals  Encounter Vitals Group  BP      Girls Systolic BP Percentile      Girls Diastolic BP Percentile      Boys Systolic BP Percentile      Boys Diastolic BP Percentile      Pulse      Resp      Temp      Temp src      SpO2      Weight      Height      Head Circumference      Peak Flow      Pain Score      Pain Loc      Pain Education      Exclude from Growth Chart    No data found.  Updated Vital Signs BP 117/79 (BP Location: Right Arm)   Pulse 88   Temp 98.3 F (36.8 C) (Oral)   Resp 18   Ht 5' 4 (1.626 m)   Wt 192 lb (87.1 kg)   LMP 11/24/2023   SpO2 99%   BMI 32.96 kg/m    Physical Exam Vitals and nursing note reviewed.  Constitutional:      Appearance: Normal appearance. She is normal weight.  HENT:     Head: Normocephalic and atraumatic.     Right Ear: Tympanic membrane, ear canal and external ear normal.     Left Ear: Tympanic membrane, ear canal and external ear normal.     Mouth/Throat:     Mouth: Mucous membranes are moist.     Pharynx: Oropharynx is clear.  Eyes:     Extraocular Movements: Extraocular movements intact.     Conjunctiva/sclera: Conjunctivae normal.     Pupils: Pupils are equal, round, and reactive to light.  Cardiovascular:     Rate and Rhythm: Normal rate and regular rhythm.     Heart sounds: Normal heart sounds.  Pulmonary:     Effort: Pulmonary effort is normal.     Breath sounds: Normal breath sounds. No wheezing, rhonchi or rales.  Musculoskeletal:        General: Normal range of motion.      Cervical back: Normal range of motion and neck supple.  Skin:    General: Skin is warm and dry.  Neurological:     General: No focal deficit present.     Mental Status: She is alert and oriented to person, place, and time. Mental status is at baseline.  Psychiatric:        Mood and Affect: Mood normal.        Behavior: Behavior normal.      UC Treatments / Results  Labs (all labs ordered are listed, but only abnormal results are displayed) Labs Reviewed - No data to display  EKG   Radiology No results found.  Procedures Procedures (including critical care time)  Medications Ordered in UC Medications  ketorolac  (TORADOL ) injection 30 mg (30 mg Intramuscular Given 12/18/23 1826)  ondansetron  (ZOFRAN -ODT) disintegrating tablet 8 mg (8 mg Oral Given 12/18/23 1825)    Initial Impression / Assessment and Plan / UC Course  I have reviewed the triage vital signs and the nursing notes.  Pertinent labs & imaging results that were available during my care of the patient were reviewed by me and considered in my medical decision making (see chart for details).     MDM: 1.  Bad headache-IM Toradol  30 mg given once in clinic and prior to discharge; 2.  Nausea-Zofran  8 mg disintegrating tablet given once in  clinic and prior to discharge, Rx'd Zofran  8 mg disintegrating tablet: Take 1 tablet every 8 hours, as needed for nausea; 3.  Migraine without aura and without status migrainosus, not intractable-Rx'd/refilled Maxalt  10 mg tablet. Advised patient to take medication as directed.  Advised may use Zofran  every 8 hours, as needed for nausea.  Encouraged to increase daily water intake to 64 ounces per day 7 days/week.  Advised if symptoms worsen and/or unresolved please follow-up with your PCP or here for further evaluation Final Clinical Impressions(s) / UC Diagnoses   Final diagnoses:  Bad headache  Migraine without aura and without status migrainosus, not intractable  Nausea      Discharge Instructions      Advised patient to take medication as directed.  Advised may use Zofran  every 8 hours, as needed for nausea.  Encouraged to increase daily water intake to 64 ounces per day 7 days/week.  Advised if symptoms worsen and/or unresolved please follow-up with your PCP or here for further evaluation     ED Prescriptions     Medication Sig Dispense Auth. Provider   rizatriptan  (MAXALT ) 10 MG tablet Take 1 tablet (10 mg total) by mouth as needed for migraine. May repeat in 2 hours if needed 10 tablet Raiford Fetterman, FNP   ondansetron  (ZOFRAN -ODT) 8 MG disintegrating tablet Take 1 tablet (8 mg total) by mouth every 8 (eight) hours as needed for nausea or vomiting. 24 tablet Aylinn Rydberg, FNP      PDMP not reviewed this encounter.   Teddy Sharper, FNP 12/18/23 [phone]

## 2023-12-18 NOTE — ED Triage Notes (Addendum)
 Pt presenting c/o migraine x 2 days. Pt stated that she took Rizitriptin, Excedrin Migraine,Nurtec, last taken yesterday which were ineffective. Pt stated,  I'm starting to feel nauseous too.  Pt stated she has a H/O migraine.

## 2023-12-18 NOTE — Discharge Instructions (Addendum)
 Advised patient to take medication as directed.  Advised may use Zofran  every 8 hours, as needed for nausea.  Encouraged to increase daily water intake to 64 ounces per day 7 days/week.  Advised if symptoms worsen and/or unresolved please follow-up with your PCP or here for further evaluation

## 2023-12-19 ENCOUNTER — Other Ambulatory Visit (HOSPITAL_COMMUNITY): Payer: Self-pay

## 2023-12-20 ENCOUNTER — Other Ambulatory Visit: Payer: Self-pay | Admitting: Certified Nurse Midwife

## 2023-12-20 DIAGNOSIS — G43009 Migraine without aura, not intractable, without status migrainosus: Secondary | ICD-10-CM

## 2023-12-20 DIAGNOSIS — F331 Major depressive disorder, recurrent, moderate: Secondary | ICD-10-CM | POA: Diagnosis not present

## 2023-12-20 MED ORDER — DIPHENHYDRAMINE HCL 25 MG PO CAPS: 25.0000 mg | ORAL_CAPSULE | Freq: Four times a day (QID) | ORAL | 0 refills | Status: AC | PRN

## 2023-12-20 MED ORDER — PREDNISONE 10 MG PO TABS: 10.0000 mg | ORAL_TABLET | Freq: Every day | ORAL | 0 refills | Status: DC

## 2023-12-20 MED ORDER — METOCLOPRAMIDE HCL 10 MG PO TABS: 10.0000 mg | ORAL_TABLET | Freq: Four times a day (QID) | ORAL | 0 refills | Status: AC

## 2023-12-20 NOTE — Progress Notes (Signed)
 History of migraines, cocktail prescribed to avoid ED visits.  Cornell Finder, CNM, MSN, IBCLC Certified Nurse Midwife, [facility]

## 2023-12-22 DIAGNOSIS — F331 Major depressive disorder, recurrent, moderate: Secondary | ICD-10-CM | POA: Diagnosis not present

## 2023-12-27 DIAGNOSIS — F331 Major depressive disorder, recurrent, moderate: Secondary | ICD-10-CM | POA: Diagnosis not present

## 2023-12-29 DIAGNOSIS — F331 Major depressive disorder, recurrent, moderate: Secondary | ICD-10-CM | POA: Diagnosis not present

## 2024-01-03 DIAGNOSIS — F331 Major depressive disorder, recurrent, moderate: Secondary | ICD-10-CM | POA: Diagnosis not present

## 2024-01-05 DIAGNOSIS — F331 Major depressive disorder, recurrent, moderate: Secondary | ICD-10-CM | POA: Diagnosis not present

## 2024-01-10 ENCOUNTER — Ambulatory Visit: Admitting: Bariatrics

## 2024-01-10 ENCOUNTER — Other Ambulatory Visit (HOSPITAL_COMMUNITY): Payer: Self-pay

## 2024-01-10 ENCOUNTER — Encounter: Payer: Self-pay | Admitting: Bariatrics

## 2024-01-10 VITALS — BP 116/79 | HR 69 | Ht 64.0 in | Wt 184.0 lb

## 2024-01-10 DIAGNOSIS — Z6831 Body mass index (BMI) 31.0-31.9, adult: Secondary | ICD-10-CM | POA: Diagnosis not present

## 2024-01-10 DIAGNOSIS — R7303 Prediabetes: Secondary | ICD-10-CM

## 2024-01-10 DIAGNOSIS — E669 Obesity, unspecified: Secondary | ICD-10-CM | POA: Diagnosis not present

## 2024-01-10 DIAGNOSIS — E6609 Other obesity due to excess calories: Secondary | ICD-10-CM

## 2024-01-10 DIAGNOSIS — R632 Polyphagia: Secondary | ICD-10-CM | POA: Diagnosis not present

## 2024-01-10 DIAGNOSIS — F331 Major depressive disorder, recurrent, moderate: Secondary | ICD-10-CM | POA: Diagnosis not present

## 2024-01-10 MED ORDER — PHENTERMINE-TOPIRAMATE ER 7.5-46 MG PO CP24
1.0000 | ORAL_CAPSULE | Freq: Every morning | ORAL | 0 refills | Status: DC
  Filled 2024-01-10 – 2024-01-15 (×2): qty 30, 30d supply, fill #0

## 2024-01-10 NOTE — Progress Notes (Signed)
 WEIGHT SUMMARY AND BIOMETRICS  Weight Lost Since Last Visit: 8lb  Weight Gained Since Last Visit: 0   Vitals BP: 116/79 Pulse Rate: 69 SpO2: 100 %   Anthropometric Measurements Height: 5' 4 (1.626 m) Weight: 184 lb (83.5 kg) BMI (Calculated): 31.57 Weight at Last Visit: 192lb Weight Lost Since Last Visit: 8lb Weight Gained Since Last Visit: 0 Starting Weight: 202lb Total Weight Loss (lbs): 18 lb (8.165 kg)   Body Composition  Body Fat %: 42 % Fat Mass (lbs): 77.6 lbs Muscle Mass (lbs): 101.6 lbs Total Body Water (lbs): 76.4 lbs Visceral Fat Rating : 8   Other Clinical Data Fasting: no Labs: no Today's Visit #: 7 Starting Date: 08/02/23    OBESITY Peggy Kelly is here to discuss her progress with her obesity treatment plan along with follow-up of her obesity related diagnoses.    Nutrition Plan: the Category 2 plan - 80% adherence.  Current exercise: Goes to the gym for exercise.  Interim History:  She is down 8 lbs since her last visit.  Eating all of the food on the plan., Protein intake is as prescribed, Is not skipping meals, Not journaling consistently., and Water intake is adequate.   Pharmacotherapy: Peggy Kelly is on Qsymia  7.5/46 mg 1 capsule by mouth daily in am Adverse side effects: None Hunger is moderately controlled. She does have a appetite in the am. Cravings are moderately controlled.  Assessment/Plan:   Peggy Kelly endorses excessive hunger.  Medication(s): Qsymia  Effects of medication:  moderately controlled. Cravings are moderately controlled.   Plan: Medication(s): Qsymia  7.5/46 mg 1 capsule by mouth daily in am Will increase water, protein and fiber to help assuage hunger.  Will minimize foods that have a high glucose index/load to minimize reactive hypoglycemia.  Will be more mindful of her journaling. Will  continue to be mindful of her eating and only eat to about 80 to 85% of fullness. She will go back to eating her breakfast on a regular basis.  Prediabetes Last A1c was 5.7  Medication(s): Qsymia  Lab Results  Component Value Date   HGBA1C 5.7 (H) 08/02/2023   HGBA1C 5.8 (H) 07/18/2022   Lab Results  Component Value Date   INSULIN  12.1 08/02/2023    Plan: Will minimize all refined carbohydrates both sweets and starches.  Will work on the plan and exercise.  Consider both aerobic and resistance training.  Will keep protein, water, and fiber intake high.  Increase Polyunsaturated and Monounsaturated fats to increase satiety and encourage weight loss.  Will continue her exercise regimen during both cardio and resistance. Continue and refill Qsymia  7.5/46 mg 1 capsule by mouth daily in am    Generalized Obesity: Current BMI BMI (Calculated): 31.57   Pharmacotherapy Plan Continue and refill  Qsymia  7.5/46 mg 1 capsule by mouth daily in am  Peggy Kelly is currently in the action stage of change.  As such, her goal is to continue with weight loss efforts.  She has agreed to the Category 2 plan.  Exercise goals: For substantial health benefits, adults should do at least 150 minutes (2 hours and 30 minutes) a week of moderate-intensity, or 75 minutes (1 hour and 15 minutes) a week of vigorous-intensity aerobic physical activity, or an equivalent combination of moderate- and vigorous-intensity aerobic activity. Aerobic activity should be performed in episodes of at least 10 minutes, and preferably, it should be spread throughout the week.  Behavioral modification strategies: increasing lean protein intake, decreasing simple carbohydrates , no meal skipping, increase water intake, better snacking choices, planning for success, increase frequency of journaling, weigh protein portions, measure portion sizes, and mindful eating.  Peggy Kelly has agreed to follow-up with our clinic in 4 weeks.     Objective:   VITALS: Per patient if applicable, see vitals. GENERAL: Alert and in no acute distress. CARDIOPULMONARY: No increased WOB. Speaking in clear sentences.  PSYCH: Pleasant and cooperative. Speech normal rate and rhythm. Affect is appropriate. Insight and judgement are appropriate. Attention is focused, linear, and appropriate.  NEURO: Oriented as arrived to appointment on time with no prompting.   Attestation Statements:   This was prepared with the assistance of Engineer, Civil (consulting).  Occasional wrong-word or sound-a-like substitutions may have occurred due to the inherent limitations of voice recognition.   Clayborne Daring, DO

## 2024-01-12 DIAGNOSIS — F331 Major depressive disorder, recurrent, moderate: Secondary | ICD-10-CM | POA: Diagnosis not present

## 2024-01-15 ENCOUNTER — Other Ambulatory Visit (HOSPITAL_COMMUNITY): Payer: Self-pay

## 2024-01-16 ENCOUNTER — Other Ambulatory Visit: Payer: Self-pay

## 2024-01-16 ENCOUNTER — Other Ambulatory Visit (HOSPITAL_COMMUNITY): Payer: Self-pay

## 2024-01-16 DIAGNOSIS — Z8669 Personal history of other diseases of the nervous system and sense organs: Secondary | ICD-10-CM

## 2024-01-16 MED ORDER — RIZATRIPTAN BENZOATE 10 MG PO TABS
10.0000 mg | ORAL_TABLET | ORAL | 0 refills | Status: DC | PRN
  Filled 2024-01-16: qty 10, 30d supply, fill #0

## 2024-01-16 MED ORDER — NURTEC 75 MG PO TBDP
75.0000 mg | ORAL_TABLET | ORAL | 10 refills | Status: AC | PRN
  Filled 2024-01-16: qty 16, 32d supply, fill #0

## 2024-01-16 NOTE — Telephone Encounter (Signed)
 Rizatriptan  refilled. No longer able to fill Nurtec at [facility] due to [employer]. Called [facility] pharmacy to request transfer of Nurtec.   Vernell RN 01/16/24

## 2024-01-17 DIAGNOSIS — F331 Major depressive disorder, recurrent, moderate: Secondary | ICD-10-CM | POA: Diagnosis not present

## 2024-01-19 DIAGNOSIS — F331 Major depressive disorder, recurrent, moderate: Secondary | ICD-10-CM | POA: Diagnosis not present

## 2024-02-07 ENCOUNTER — Other Ambulatory Visit (HOSPITAL_COMMUNITY): Payer: Self-pay

## 2024-02-07 ENCOUNTER — Encounter: Payer: Self-pay | Admitting: Bariatrics

## 2024-02-07 ENCOUNTER — Ambulatory Visit: Admitting: Bariatrics

## 2024-02-07 VITALS — BP 118/78 | HR 79 | Ht 64.0 in | Wt 176.0 lb

## 2024-02-07 DIAGNOSIS — R632 Polyphagia: Secondary | ICD-10-CM | POA: Diagnosis not present

## 2024-02-07 DIAGNOSIS — R7303 Prediabetes: Secondary | ICD-10-CM

## 2024-02-07 DIAGNOSIS — E6609 Other obesity due to excess calories: Secondary | ICD-10-CM

## 2024-02-07 DIAGNOSIS — E669 Obesity, unspecified: Secondary | ICD-10-CM | POA: Diagnosis not present

## 2024-02-07 DIAGNOSIS — Z6830 Body mass index (BMI) 30.0-30.9, adult: Secondary | ICD-10-CM | POA: Diagnosis not present

## 2024-02-07 MED ORDER — PHENTERMINE-TOPIRAMATE ER 7.5-46 MG PO CP24
1.0000 | ORAL_CAPSULE | Freq: Every morning | ORAL | 0 refills | Status: AC
  Filled 2024-02-07 – 2024-02-19 (×3): qty 30, 30d supply, fill #0

## 2024-02-07 NOTE — Progress Notes (Signed)
 "                                                                                                             WEIGHT SUMMARY AND BIOMETRICS  Weight Lost Since Last Visit: 8lb  Weight Gained Since Last Visit: 0   Vitals BP: 118/78 Pulse Rate: 79 SpO2: 100 %   Anthropometric Measurements Height: 5' 4 (1.626 m) Weight: 176 lb (79.8 kg) BMI (Calculated): 30.2 Weight at Last Visit: 184lb Weight Lost Since Last Visit: 8lb Weight Gained Since Last Visit: 0 Starting Weight: 202lb Total Weight Loss (lbs): 26 lb (11.8 kg)   Body Composition  Body Fat %: 41 % Fat Mass (lbs): 72.4 lbs Muscle Mass (lbs): 99.2 lbs Total Body Water (lbs): 76.2 lbs Visceral Fat Rating : 7   Other Clinical Data Fasting: no Labs: no Today's Visit #: 8 Starting Date: 08/02/23    OBESITY Peggy Kelly is here to discuss her progress with her obesity treatment plan along with follow-up of her obesity related diagnoses.    Nutrition Plan: the Category 2 plan - 90% adherence.  Current exercise: goes to the gym for exercise.  Interim History:  She is down 8 lbs since her last visit.  She is still within her 21-day fast.  She is drinking plenty of fluids and is eating protein.  She states that her appetite and cravings are much improved.  She states that she does want to stay on the medication for a while longer but at some point may want to stop.  She is considering IVF later on in the year.  She is currently aware that she needs good birth control as long as taking the Qsymia .  We discussed that she should be off of the Qsymia  for about 3 months prior to beginning IVF and she voiced understanding. Eating all of the food on the plan., Protein intake is as prescribed, and Water intake is adequate.   Pharmacotherapy: Sharai is on Qsymia  7.5/46 mg 1 capsule by mouth daily in am Adverse side effects: None Hunger is moderately controlled.  Cravings are moderately controlled.  Assessment/Plan:    Cary Wilford endorses excessive hunger.  Medication(s): Qsymia  Effects of medication:  moderately controlled. Cravings are moderately controlled.   Plan: Medication(s): Qsymia  7.5/46 mg 1 capsule by mouth daily in am Will increase water, protein and fiber to help assuage hunger.  Will minimize foods that have a high glucose index/load to minimize reactive hypoglycemia.   Prediabetes Last A1c was 5.7  Medication(s): none Lab Results  Component Value Date   HGBA1C 5.7 (H) 08/02/2023   HGBA1C 5.8 (H) 07/18/2022   Lab Results  Component Value Date   INSULIN  12.1 08/02/2023    Plan: Will minimize all refined carbohydrates both sweets and starches.  Will work on the plan and exercise.  Consider both aerobic and resistance training.  Will keep protein, water, and fiber intake high.  Continue and refill Qsymia  7.5/46 mg 1 capsule by mouth daily in am    Generalized Obesity: Current BMI BMI (  Calculated): 30.2   Pharmacotherapy Plan Continue and refill  Qsymia  7.5/46 mg 1 capsule by mouth daily in am  Peace is currently in the action stage of change. As such, her goal is to continue with weight loss efforts.  She has agreed to the Category 2 plan.  Exercise goals: All adults should avoid inactivity. Some physical activity is better than none, and adults who participate in any amount of physical activity gain some health benefits.  Behavioral modification strategies: increasing lean protein intake, decrease snacking , avoiding temptations, keep healthy foods in the home, weigh protein portions, measure portion sizes, work on smaller portions, and mindful eating.  Lidie has agreed to follow-up with our clinic in 4 weeks.     Objective:   VITALS: Per patient if applicable, see vitals. GENERAL: Alert and in no acute distress. CARDIOPULMONARY: No increased WOB. Speaking in clear sentences.  PSYCH: Pleasant and cooperative. Speech normal rate and rhythm. Affect is  appropriate. Insight and judgement are appropriate. Attention is focused, linear, and appropriate.  NEURO: Oriented as arrived to appointment on time with no prompting.   Attestation Statements:   This was prepared with the assistance of Engineer, Civil (consulting).  Occasional wrong-word or sound-a-like substitutions may have occurred due to the inherent limitations of voice recognition.   Clayborne Daring, DO   "

## 2024-02-16 ENCOUNTER — Other Ambulatory Visit (HOSPITAL_COMMUNITY): Payer: Self-pay

## 2024-02-16 ENCOUNTER — Encounter: Payer: Self-pay | Admitting: Oncology

## 2024-02-18 ENCOUNTER — Other Ambulatory Visit (HOSPITAL_COMMUNITY): Payer: Self-pay

## 2024-02-18 ENCOUNTER — Telehealth (HOSPITAL_COMMUNITY): Payer: Self-pay

## 2024-02-18 ENCOUNTER — Other Ambulatory Visit: Payer: Self-pay | Admitting: Certified Nurse Midwife

## 2024-02-18 DIAGNOSIS — Z8669 Personal history of other diseases of the nervous system and sense organs: Secondary | ICD-10-CM

## 2024-02-18 MED ORDER — RIZATRIPTAN BENZOATE 10 MG PO TABS
10.0000 mg | ORAL_TABLET | ORAL | 0 refills | Status: AC | PRN
  Filled 2024-02-18: qty 10, 30d supply, fill #0

## 2024-02-19 ENCOUNTER — Other Ambulatory Visit (HOSPITAL_COMMUNITY): Payer: Self-pay

## 2024-02-19 ENCOUNTER — Telehealth (HOSPITAL_COMMUNITY): Payer: Self-pay

## 2024-02-19 NOTE — Telephone Encounter (Signed)
 PA request has been Received. New Encounter has been or will be created for follow up. For additional info see Pharmacy Prior Auth telephone encounter from 02/19/24.

## 2024-02-19 NOTE — Telephone Encounter (Signed)
 Pharmacy Patient Advocate Encounter  Received notification from [facility] that Prior Authorization for Phentermine -Topiramate  ER 7.5-46MG  er capsules  has been APPROVED from 01/26/24 to 02/17/25. Ran test claim, Copay is $5. This test claim was processed through [facility]- copay amounts may vary at other pharmacies due to pharmacy/plan contracts, or as the patient moves through the different stages of their insurance plan.   PA #/Case ID/Reference #: [id number]

## 2024-02-19 NOTE — Telephone Encounter (Signed)
 Pharmacy Patient Advocate Encounter   Received notification from Pt Calls Messages that prior authorization for Phentermine -Topiramate  ER 7.5-46MG  er capsules  is required/requested.   Insurance verification completed.   The patient is insured through [facility].   Per test claim: PA required; PA submitted to above mentioned insurance via Latent Key/confirmation #/EOC [id number] Status is pending

## 2024-02-29 ENCOUNTER — Ambulatory Visit

## 2024-02-29 ENCOUNTER — Other Ambulatory Visit (HOSPITAL_COMMUNITY): Payer: Self-pay

## 2024-02-29 ENCOUNTER — Ambulatory Visit
Admission: RE | Admit: 2024-02-29 | Discharge: 2024-02-29 | Disposition: A | Discharge status: Home / Self Care | Source: Home / Self Care

## 2024-02-29 VITALS — BP 125/81 | HR 85 | Temp 98.3°F | Resp 16 | Wt 170.0 lb

## 2024-02-29 DIAGNOSIS — J029 Acute pharyngitis, unspecified: Secondary | ICD-10-CM

## 2024-02-29 DIAGNOSIS — R051 Acute cough: Secondary | ICD-10-CM

## 2024-02-29 DIAGNOSIS — H9203 Otalgia, bilateral: Secondary | ICD-10-CM

## 2024-02-29 LAB — POCT RAPID STREP A (OFFICE): Rapid Strep A Screen: NEGATIVE

## 2024-02-29 MED ORDER — PREDNISONE 10 MG (21) PO TBPK
ORAL_TABLET | ORAL | 0 refills | Status: AC
  Filled 2024-02-29: qty 21, 6d supply, fill #0

## 2024-02-29 MED ORDER — FLUTICASONE PROPIONATE 50 MCG/ACT NA SUSP
1.0000 | Freq: Every day | NASAL | 2 refills | Status: AC
  Filled 2024-02-29: qty 16, 60d supply, fill #0

## 2024-02-29 NOTE — ED Provider Notes (Signed)
 " [facility]    CSN: [id number] Arrival date & time: 02/29/24  9050      History   Chief Complaint Chief Complaint  Patient presents with   Cough   Sore Throat   Nasal Congestion    HPI Peggy Kelly is a 34 y.o. female.   Patient presents today with sore throat cough congestion ear pain for approximately 5 days.  Denies any nausea vomiting or diarrhea.  No fevers.  Has been taking over-the-counter medicine with no relief.    Past Medical History:  Diagnosis Date   Allergy    Anemia    Anxiety    Back pain    Cesarean delivery delivered 08/20/2017   Joint pain    Migraines    Missed abortion 12/15/2022   Seasonal allergies    Vaginal Pap smear, abnormal    Vitamin D  deficiency     Patient Active Problem List   Diagnosis Date Noted   Prediabetes 08/08/2023   Anxiety 02/07/2023   Arthralgia of left knee 02/06/2023   History of miscarriage 01/10/2023   Migraine without aura and without status migrainosus, not intractable 08/26/2022   Eczema 01/11/2022   Seborrheic eczema of scalp 01/11/2022   Iron  deficiency anemia 02/25/2014    Past Surgical History:  Procedure Laterality Date   APPENDECTOMY     CESAREAN SECTION N/A 08/20/2017   Procedure: CESAREAN SECTION;  Surgeon: Marget Lenis, MD;  Location: [facility];  Service: Obstetrics;  Laterality: N/A;   COLPOSCOPY N/A 05/09/2013   Procedure: COLPOSCOPY WITH ECC;  Surgeon: Truman Corona, MD;  Location: [facility];  Service: Gynecology;  Laterality: N/A;   DILATATION & CURETTAGE/HYSTEROSCOPY WITH TRUECLEAR N/A 05/09/2013   Procedure: DILATATION & CURETTAGE/HYSTEROSCOPY WITH TRUCLEAR;  Surgeon: Truman Corona, MD;  Location: [facility];  Service: Gynecology;  Laterality: N/A;   DILATION AND EVACUATION N/A 12/15/2022   Procedure: DILATATION AND EVACUATION;  Surgeon: Zina Jerilynn LABOR, MD;  Location: [facility] OR;  Service: Gynecology;  Laterality: N/A;   LEEP      OB History     Gravida  2   Para  1    Term  1   Preterm  0   AB  0   Living  1      SAB  0   IAB  0   Ectopic  0   Multiple      Live Births  1            Home Medications    Prior to Admission medications  Medication Sig Start Date End Date Taking? Authorizing Provider  fluticasone  (FLONASE ) 50 MCG/ACT nasal spray Place 1 spray into both nostrils daily. 02/29/24  Yes Merilee Andrea CROME, NP  predniSONE  (STERAPRED UNI-PAK 21 TAB) 10 MG (21) TBPK tablet Take by mouth daily. Take 6 tabs by mouth daily  for 2 days, then 5 tabs for 2 days, then 4 tabs for 2 days, then 3 tabs for 2 days, 2 tabs for 2 days, then 1 tab by mouth daily for 2 days 02/29/24  Yes Merilee Andrea CROME, NP  albuterol  (VENTOLIN  HFA) 108 (90 Base) MCG/ACT inhaler Inhale 2 puffs into the lungs every 6 (six) hours as needed for wheezing or shortness of breath. 05/31/23   Vannie Cornell JONELLE, CNM  atomoxetine  (STRATTERA ) 40 MG capsule Take 1 capsule (40 mg total) by mouth daily. 06/21/23   Walker, Jamilla R, CNM  Cholecalciferol (VITAMIN D3 PO) daily.    [provider]  clindamycin  (CLEOCIN  T) 1 % SWAB Apply 1 Application topically daily. 11/23/23   Alm Delon SAILOR, DO  clobetasol  (TEMOVATE ) 0.05 % external solution Apply twice daily to affected areas on scalp as needed for itching/irritation. Avoid applying to face, groin, and axilla. 11/23/23   Alm Delon SAILOR, DO  clobetasol  cream (TEMOVATE ) 0.05 % Apply twice a day to affected areas on body up to 2 weeks as needed for eczema. Avoid applying to face, groin, and axilla. 11/23/23   Alm Delon SAILOR, DO  diphenhydrAMINE  (BENADRYL  ALLERGY) 25 mg capsule Take 1 capsule (25 mg total) by mouth every 6 (six) hours as needed. 12/20/23   Vannie Cornell SAUNDERS, CNM  fluocinolone  (SYNALAR ) 0.01 % external solution Apply topically daily. 11/02/22   Vannie Cornell SAUNDERS, CNM  Fluocinolone  Acetonide Scalp (DERMA-SMOOTHE /FS SCALP) 0.01 % OIL Apply to affected areas on scalp at least 2 hours prior to shampooing.  03/09/23   Alm Delon SAILOR, DO  ketoconazole  (NIZORAL ) 2 % shampoo Apply topically 2 (two) times a week. 11/03/22   Vannie Cornell SAUNDERS, CNM  magnesium oxide (MAG-OX) 400 (240 Mg) MG tablet Take 0.5 tablets (200 mg total) by mouth at bedtime. 01/30/23   Vannie Cornell SAUNDERS, CNM  metoCLOPramide  (REGLAN ) 10 MG tablet Take 1 tablet (10 mg total) by mouth every 6 (six) hours. 12/20/23   Vannie Cornell SAUNDERS, CNM  metoprolol  succinate (TOPROL -XL) 25 MG 24 hr tablet Take 1 tablet (25 mg total) by mouth daily. 07/27/23   Walker, Jamilla R, CNM  ondansetron  (ZOFRAN -ODT) 8 MG disintegrating tablet Dissolve 1 tablet (8 mg total) in mouth every 8 (eight) hours as needed for nausea or vomiting. 12/18/23   Teddy Sharper, FNP  Phentermine -Topiramate  ER 7.5-46 MG CP24 Take 1 capsule by mouth every morning. 02/07/24   Delores Shields A, DO  promethazine -dextromethorphan (PROMETHAZINE -DM) 6.25-15 MG/5ML syrup Take 5 mLs by mouth 3 (three) times daily as needed for cough. Patient not taking: Reported on 02/07/2024 05/31/23   Vannie Cornell SAUNDERS, CNM  Rimegepant Sulfate  (NURTEC) 75 MG TBDP Take 1 tablet (75 mg total) by mouth every other day. 04/21/23   Teague Gretta, Darice BRAVO, PA-C  Rimegepant Sulfate  (NURTEC) 75 MG TBDP Place 1 tablet (75 mg total) under the tongue every other day as needed. 07/19/23     rizatriptan  (MAXALT ) 10 MG tablet Take 1 tablet (10 mg total) by mouth as needed for migraine. May repeat in 2 hours if needed 02/18/24   Vannie Cornell SAUNDERS, CNM  Sodium Fluoride  1.1 % PSTE Apply a pea sized amount onto brush, apply to all surfaces of the teeth for 2 minutes daily and spit out. 12/18/23     spironolactone  (ALDACTONE ) 100 MG tablet Take 1 tablet (100 mg total) by mouth daily with a meal. 11/23/23   Alm Delon SAILOR, DO  tranexamic acid  (LYSTEDA ) 650 MG TABS tablet Take 2 tablets (1,300 mg total) by mouth 3 (three) times daily. Take during menses for a maximum of five days Patient not taking: Reported on 02/07/2024 09/27/23    Vannie Cornell SAUNDERS, CNM  traZODone  (DESYREL ) 50 MG tablet TAKE 1 TABLET(50 MG) BY MOUTH TWICE DAILY AS NEEDED FOR SLEEP Patient not taking: Reported on 02/07/2024 02/18/23   Vannie Cornell SAUNDERS, CNM  tretinoin  (RETIN-A ) 0.025 % cream Apply topically at bedtime three nights a week (Monday, Wednesday, and Friday). 11/23/23 11/22/24  Alm Delon SAILOR, DO    Family History Family History  Problem Relation Age of Onset   Thyroid  disease Mother  Migraines Sister    Other Sister        anti NMDA receptor encephalitis   Thyroid  disease Maternal Grandmother    Cancer Maternal Grandfather        prostate    Social History Social History[1]   Allergies   Penicillins   Review of Systems Review of Systems  Constitutional:  Negative for chills and fever.  HENT:  Positive for congestion, postnasal drip, rhinorrhea, sinus pressure and sore throat.   Eyes: Negative.   Respiratory:  Positive for cough.   Cardiovascular: Negative.   Gastrointestinal: Negative.   Genitourinary: Negative.   Neurological: Negative.      Physical Exam Triage Vital Signs ED Triage Vitals  Encounter Vitals Group     BP 02/29/24 1019 125/81     Girls Systolic BP Percentile --      Girls Diastolic BP Percentile --      Boys Systolic BP Percentile --      Boys Diastolic BP Percentile --      Pulse Rate 02/29/24 1019 85     Resp 02/29/24 1019 16     Temp 02/29/24 1019 98.3 F (36.8 C)     Temp Source 02/29/24 1019 Oral     SpO2 02/29/24 1019 98 %     Weight 02/29/24 1016 170 lb (77.1 kg)     Height --      Head Circumference --      Peak Flow --      Pain Score 02/29/24 1016 7     Pain Loc --      Pain Education --      Exclude from Growth Chart --    No data found.  Updated Vital Signs BP 125/81 (BP Location: Left Arm)   Pulse 85   Temp 98.3 F (36.8 C) (Oral)   Resp 16   Wt 170 lb (77.1 kg)   LMP 02/14/2024   SpO2 98%   BMI 29.18 kg/m   Visual Acuity Right Eye Distance:   Left Eye  Distance:   Bilateral Distance:    Right Eye Near:   Left Eye Near:    Bilateral Near:     Physical Exam Constitutional:      Appearance: She is well-developed.  HENT:     Right Ear: Tenderness present. Tympanic membrane is not erythematous.     Left Ear: Tenderness present. Tympanic membrane is not erythematous.     Nose: Congestion present.     Mouth/Throat:     Mouth: Mucous membranes are moist.     Tonsils: No tonsillar exudate or tonsillar abscesses. 1+ on the right. 1+ on the left.  Eyes:     Conjunctiva/sclera: Conjunctivae normal.  Cardiovascular:     Rate and Rhythm: Normal rate.  Pulmonary:     Effort: Pulmonary effort is normal.     Breath sounds: Normal breath sounds.  Abdominal:     General: Bowel sounds are normal.     Palpations: Abdomen is soft.  Musculoskeletal:     Cervical back: Normal range of motion.  Skin:    General: Skin is warm.  Neurological:     General: No focal deficit present.     Mental Status: She is alert.      UC Treatments / Results  Labs (all labs ordered are listed, but only abnormal results are displayed) Labs Reviewed  POCT RAPID STREP A (OFFICE) - Normal    EKG   Radiology No results found.  Procedures  Procedures (including critical care time)  Medications Ordered in UC Medications - No data to display  Initial Impression / Assessment and Plan / UC Course  I have reviewed the triage vital signs and the nursing notes.  Pertinent labs & imaging results that were available during my care of the patient were reviewed by me and considered in my medical decision making (see chart for details).     Strep test is negative Symptoms are more consistent with a viral illness Continue to take over-the-counter cough and cold medicines as needed Cough can linger for 7 to 14 days Take Tylenol  or Motrin  as needed for pain or fever Use nasal spray as needed for nasal congestion Drink plenty of fluids Final Clinical  Impressions(s) / UC Diagnoses   Final diagnoses:  Acute cough  Acute pharyngitis, unspecified etiology  Acute ear pain, bilateral     Discharge Instructions      Strep test is negative Symptoms are more consistent with a viral illness Continue to take over-the-counter cough and cold medicines as needed Cough can linger for 7 to 14 days Take Tylenol  or Motrin  as needed for pain or fever Use nasal spray as needed for nasal congestion Drink plenty of fluids     ED Prescriptions     Medication Sig Dispense Auth. Provider   predniSONE  (STERAPRED UNI-PAK 21 TAB) 10 MG (21) TBPK tablet Take by mouth daily. Take 6 tabs by mouth daily  for 2 days, then 5 tabs for 2 days, then 4 tabs for 2 days, then 3 tabs for 2 days, 2 tabs for 2 days, then 1 tab by mouth daily for 2 days 42 tablet Merilee Hollering L, NP   fluticasone  (FLONASE ) 50 MCG/ACT nasal spray Place 1 spray into both nostrils daily. 16 g Merilee Hollering CROME, NP      PDMP not reviewed this encounter.    [1]  Social History Tobacco Use   Smoking status: Never   Smokeless tobacco: Never  Vaping Use   Vaping status: Never Used  Substance Use Topics   Alcohol use: Not Currently    Comment: occassional, 09/23/15 none now   Drug use: No     Merilee Hollering CROME, NP 02/29/24 1048  "

## 2024-02-29 NOTE — Discharge Instructions (Addendum)
 Strep test is negative Symptoms are more consistent with a viral illness Continue to take over-the-counter cough and cold medicines as needed Cough can linger for 7 to 14 days Take Tylenol  or Motrin  as needed for pain or fever Use nasal spray as needed for nasal congestion Drink plenty of fluids

## 2024-02-29 NOTE — ED Triage Notes (Signed)
 Pt presents with a sore throat, dry cough and congestion x 5 days. Pt lost her voice yesterday. She has taken OTC cold medication.

## 2024-03-05 ENCOUNTER — Ambulatory Visit: Admitting: Bariatrics

## 2024-03-06 ENCOUNTER — Ambulatory Visit: Admitting: Bariatrics

## 2024-05-23 ENCOUNTER — Ambulatory Visit: Admitting: Dermatology
